# Patient Record
Sex: Female | Born: 1965
Health system: Southern US, Community
[De-identification: ages and names within clinical notes are randomized; demographics above are authoritative.]

## PROBLEM LIST (undated history)

## (undated) DIAGNOSIS — R011 Cardiac murmur, unspecified: Secondary | ICD-10-CM

## (undated) DIAGNOSIS — I8390 Asymptomatic varicose veins of unspecified lower extremity: Secondary | ICD-10-CM

## (undated) DIAGNOSIS — E785 Hyperlipidemia, unspecified: Secondary | ICD-10-CM

## (undated) DIAGNOSIS — T783XXA Angioneurotic edema, initial encounter: Secondary | ICD-10-CM

## (undated) DIAGNOSIS — J069 Acute upper respiratory infection, unspecified: Secondary | ICD-10-CM

## (undated) DIAGNOSIS — L509 Urticaria, unspecified: Secondary | ICD-10-CM

## (undated) DIAGNOSIS — J189 Pneumonia, unspecified organism: Secondary | ICD-10-CM

## (undated) DIAGNOSIS — R51 Headache: Secondary | ICD-10-CM

## (undated) DIAGNOSIS — K8020 Calculus of gallbladder without cholecystitis without obstruction: Secondary | ICD-10-CM

## (undated) HISTORY — DX: Hyperlipidemia, unspecified: E78.5

## (undated) HISTORY — DX: Cardiac murmur, unspecified: R01.1

## (undated) HISTORY — DX: Urticaria, unspecified: L50.9

## (undated) HISTORY — DX: Acute upper respiratory infection, unspecified: J06.9

## (undated) HISTORY — DX: Angioneurotic edema, initial encounter: T78.3XXA

## (undated) HISTORY — PX: KNEE SURGERY: SHX244

## (undated) HISTORY — PX: SHOULDER SURGERY: SHX246

## (undated) HISTORY — DX: Headache: R51

## (undated) HISTORY — DX: Asymptomatic varicose veins of unspecified lower extremity: I83.90

## (undated) HISTORY — DX: Pneumonia, unspecified organism: J18.9

## (undated) HISTORY — DX: Calculus of gallbladder without cholecystitis without obstruction: K80.20

---

## 1995-08-01 HISTORY — PX: WISDOM TOOTH EXTRACTION: SHX21

## 1998-10-07 ENCOUNTER — Other Ambulatory Visit: Admission: RE | Admit: 1998-10-07 | Discharge: 1998-10-07 | Payer: Self-pay | Admitting: Obstetrics & Gynecology

## 1999-02-28 ENCOUNTER — Observation Stay (HOSPITAL_COMMUNITY): Admission: RE | Admit: 1999-02-28 | Discharge: 1999-03-01 | Payer: Self-pay | Admitting: Surgery

## 1999-11-18 ENCOUNTER — Other Ambulatory Visit: Admission: RE | Admit: 1999-11-18 | Discharge: 1999-11-18 | Payer: Self-pay | Admitting: Obstetrics and Gynecology

## 2001-05-20 ENCOUNTER — Other Ambulatory Visit: Admission: RE | Admit: 2001-05-20 | Discharge: 2001-05-20 | Payer: Self-pay | Admitting: Internal Medicine

## 2002-08-01 ENCOUNTER — Other Ambulatory Visit: Admission: RE | Admit: 2002-08-01 | Discharge: 2002-08-01 | Payer: Self-pay | Admitting: Internal Medicine

## 2003-06-16 ENCOUNTER — Ambulatory Visit (HOSPITAL_BASED_OUTPATIENT_CLINIC_OR_DEPARTMENT_OTHER): Admission: RE | Admit: 2003-06-16 | Discharge: 2003-06-16 | Payer: Self-pay | Admitting: Orthopedic Surgery

## 2003-09-29 ENCOUNTER — Other Ambulatory Visit: Admission: RE | Admit: 2003-09-29 | Discharge: 2003-09-29 | Payer: Self-pay | Admitting: Internal Medicine

## 2005-01-13 ENCOUNTER — Ambulatory Visit: Payer: Self-pay | Admitting: Internal Medicine

## 2005-01-20 ENCOUNTER — Ambulatory Visit: Payer: Self-pay | Admitting: Internal Medicine

## 2005-01-20 ENCOUNTER — Other Ambulatory Visit: Admission: RE | Admit: 2005-01-20 | Discharge: 2005-01-20 | Payer: Self-pay | Admitting: Internal Medicine

## 2005-02-02 ENCOUNTER — Ambulatory Visit: Payer: Self-pay | Admitting: Internal Medicine

## 2005-07-31 HISTORY — PX: CHOLECYSTECTOMY: SHX55

## 2006-01-22 ENCOUNTER — Ambulatory Visit: Payer: Self-pay | Admitting: Internal Medicine

## 2006-02-05 ENCOUNTER — Ambulatory Visit: Payer: Self-pay | Admitting: Internal Medicine

## 2006-02-05 ENCOUNTER — Other Ambulatory Visit: Admission: RE | Admit: 2006-02-05 | Discharge: 2006-02-05 | Payer: Self-pay | Admitting: Internal Medicine

## 2006-07-02 ENCOUNTER — Ambulatory Visit: Payer: Self-pay | Admitting: Internal Medicine

## 2006-07-02 ENCOUNTER — Encounter: Admission: RE | Admit: 2006-07-02 | Discharge: 2006-07-02 | Payer: Self-pay | Admitting: Internal Medicine

## 2006-11-30 ENCOUNTER — Ambulatory Visit: Payer: Self-pay | Admitting: Internal Medicine

## 2006-11-30 LAB — CONVERTED CEMR LAB
Basophils Absolute: 0 10*3/uL (ref 0.0–0.1)
Basophils Relative: 0.2 % (ref 0.0–1.0)
MCHC: 34.4 g/dL (ref 30.0–36.0)
Monocytes Relative: 7.5 % (ref 3.0–11.0)
Neutrophils Relative %: 63.7 % (ref 43.0–77.0)
Platelets: 309 10*3/uL (ref 150–400)
RBC: 4.8 M/uL (ref 3.87–5.11)
WBC: 5.7 10*3/uL (ref 4.5–10.5)
hCG, Beta Chain, Quant, S: <0.5 milliintl units/mL

## 2007-03-22 ENCOUNTER — Encounter: Payer: Self-pay | Admitting: Internal Medicine

## 2007-05-02 DIAGNOSIS — R519 Headache, unspecified: Secondary | ICD-10-CM | POA: Insufficient documentation

## 2007-05-02 DIAGNOSIS — R51 Headache: Secondary | ICD-10-CM

## 2007-05-22 ENCOUNTER — Encounter: Payer: Self-pay | Admitting: Internal Medicine

## 2007-07-01 ENCOUNTER — Encounter: Payer: Self-pay | Admitting: Internal Medicine

## 2007-10-04 ENCOUNTER — Ambulatory Visit: Payer: Self-pay | Admitting: Internal Medicine

## 2007-10-04 DIAGNOSIS — S6390XA Sprain of unspecified part of unspecified wrist and hand, initial encounter: Secondary | ICD-10-CM | POA: Insufficient documentation

## 2007-10-04 DIAGNOSIS — N390 Urinary tract infection, site not specified: Secondary | ICD-10-CM

## 2007-10-04 DIAGNOSIS — R350 Frequency of micturition: Secondary | ICD-10-CM | POA: Insufficient documentation

## 2007-10-04 LAB — CONVERTED CEMR LAB
Nitrite: POSITIVE
Specific Gravity, Urine: 1.025
Urobilinogen, UA: 0.2
WBC Urine, dipstick: NEGATIVE

## 2007-10-05 ENCOUNTER — Encounter: Payer: Self-pay | Admitting: Internal Medicine

## 2007-10-28 ENCOUNTER — Other Ambulatory Visit: Admission: RE | Admit: 2007-10-28 | Discharge: 2007-10-28 | Payer: Self-pay | Admitting: Internal Medicine

## 2007-10-28 ENCOUNTER — Encounter: Payer: Self-pay | Admitting: Internal Medicine

## 2007-10-28 ENCOUNTER — Ambulatory Visit: Payer: Self-pay | Admitting: Internal Medicine

## 2007-11-20 ENCOUNTER — Ambulatory Visit: Payer: Self-pay | Admitting: Internal Medicine

## 2007-11-20 ENCOUNTER — Telehealth: Payer: Self-pay | Admitting: *Deleted

## 2007-11-20 LAB — CONVERTED CEMR LAB
Nitrite: NEGATIVE
Urobilinogen, UA: 1

## 2007-11-21 ENCOUNTER — Encounter: Payer: Self-pay | Admitting: Internal Medicine

## 2007-11-22 ENCOUNTER — Ambulatory Visit: Payer: Self-pay | Admitting: Internal Medicine

## 2007-11-25 LAB — CONVERTED CEMR LAB
Cholesterol: 195 mg/dL (ref 0–200)
HDL: 73.6 mg/dL (ref >39.0)
LDL Cholesterol: 114 mg/dL — ABNORMAL HIGH (ref 0–99)
Triglycerides: 39 mg/dL (ref 0–149)
VLDL: 8 mg/dL (ref 0–40)

## 2008-10-19 ENCOUNTER — Ambulatory Visit: Payer: Self-pay | Admitting: Internal Medicine

## 2010-02-04 ENCOUNTER — Ambulatory Visit: Payer: Self-pay | Admitting: Internal Medicine

## 2010-02-04 DIAGNOSIS — N63 Unspecified lump in unspecified breast: Secondary | ICD-10-CM | POA: Insufficient documentation

## 2010-02-07 ENCOUNTER — Telehealth: Payer: Self-pay | Admitting: Internal Medicine

## 2010-02-14 ENCOUNTER — Encounter: Payer: Self-pay | Admitting: Internal Medicine

## 2010-04-07 ENCOUNTER — Ambulatory Visit: Payer: Self-pay | Admitting: Internal Medicine

## 2010-04-07 LAB — CONVERTED CEMR LAB
ALT: 8 units/L (ref 0–35)
Albumin: 3.9 g/dL (ref 3.5–5.2)
BUN: 16 mg/dL (ref 6–23)
Basophils Absolute: 0 10*3/uL (ref 0.0–0.1)
Basophils Relative: 0.6 % (ref 0.0–3.0)
CO2: 28 meq/L (ref 19–32)
Calcium: 9.1 mg/dL (ref 8.4–10.5)
Chloride: 105 meq/L (ref 96–112)
Cholesterol: 181 mg/dL (ref 0–200)
Creatinine, Ser: 0.7 mg/dL (ref 0.4–1.2)
Eosinophils Relative: 0.8 % (ref 0.0–5.0)
GFR calc non Af Amer: 99.78 mL/min (ref >60)
Monocytes Relative: 8 % (ref 3.0–12.0)
Nitrite: NEGATIVE
Platelets: 268 10*3/uL (ref 150.0–400.0)
RBC: 4.12 M/uL (ref 3.87–5.11)
RDW: 13.2 % (ref 11.5–14.6)
Specific Gravity, Urine: 1.025
TSH: 1.42 microintl units/mL (ref 0.35–5.50)
Total CHOL/HDL Ratio: 3
Total Protein: 6.3 g/dL (ref 6.0–8.3)
Urobilinogen, UA: 0.2
WBC Urine, dipstick: NEGATIVE
WBC: 5.7 10*3/uL (ref 4.5–10.5)
pH: 7.5

## 2010-04-13 ENCOUNTER — Ambulatory Visit: Payer: Self-pay | Admitting: Internal Medicine

## 2010-04-13 ENCOUNTER — Other Ambulatory Visit: Admission: RE | Admit: 2010-04-13 | Discharge: 2010-04-13 | Payer: Self-pay | Admitting: Internal Medicine

## 2010-04-13 DIAGNOSIS — M19019 Primary osteoarthritis, unspecified shoulder: Secondary | ICD-10-CM | POA: Insufficient documentation

## 2010-04-13 LAB — HM PAP SMEAR

## 2010-07-29 ENCOUNTER — Encounter: Payer: Self-pay | Admitting: Internal Medicine

## 2010-08-28 LAB — CONVERTED CEMR LAB
Alkaline Phosphatase: 38 units/L — ABNORMAL LOW (ref 39–117)
BUN: 13 mg/dL (ref 6–23)
Basophils Relative: 0.5 % (ref 0.0–1.0)
Bilirubin Urine: NEGATIVE
Bilirubin, Direct: 0.1 mg/dL (ref 0.0–0.3)
Creatinine, Ser: 0.7 mg/dL (ref 0.4–1.2)
Eosinophils Absolute: 0 10*3/uL (ref 0.0–0.7)
Eosinophils Relative: 0.4 % (ref 0.0–5.0)
HCT: 39.9 % (ref 36.0–46.0)
Lymphocytes Relative: 21.4 % (ref 12.0–46.0)
MCV: 87.3 fL (ref 78.0–100.0)
Monocytes Relative: 7.3 % (ref 3.0–12.0)
Nitrite: NEGATIVE
Potassium: 3.6 meq/L (ref 3.5–5.1)
RBC: 4.57 M/uL (ref 3.87–5.11)
Sodium: 138 meq/L (ref 135–145)
Specific Gravity, Urine: >=1.03
TSH: 0.88 microintl units/mL (ref 0.35–5.50)
Total Bilirubin: 0.9 mg/dL (ref 0.3–1.2)
Urobilinogen, UA: 0.2
WBC Urine, dipstick: NEGATIVE
WBC: 7 10*3/uL (ref 4.5–10.5)
pH: 7

## 2010-09-01 NOTE — Progress Notes (Signed)
  Phone Note Call from Patient   Caller: Patient Call For: Madelin Headings MD Summary of Call: Pt is asking when her Mammogram was scheduled? [phone]  Initial call taken by: Lynann Beaver CMA,  February 07, 2010 1:32 PM  Follow-up for Phone Call        [facility] for pt twice today. Follow-up by: Corky Mull,  February 07, 2010 3:14 PM     Appended Document:  pt returned Terri's call.   [phone] until 4 pm

## 2010-09-01 NOTE — Assessment & Plan Note (Signed)
Summary: CPX/PAP/CJR   Vital Signs:  Patient profile:   45 year old female Menstrual status:  regular LMP:     04/09/2010 Height:      61.5 inches Weight:      108 pounds Pulse rate:   80 / minute BP sitting:   120 / 80  (right arm) Cuff size:   regular  Vitals Entered By: Romualdo Bolk, CMA (AAMA) (April 13, 2010 11:19 AM) CC: CPX-Discuss doing a pap-Pt is going to have surgery 10/7 for left shoulder.  LMP (date): 04/09/2010 LMP - Character: light Menarche (age onset years): 14   Menses interval (days): 28 Menstrual flow (days): 4 Enter LMP: 04/09/2010 Last PAP Result normal   History of Present Illness: Stephanie Hoffman comes in today  for preventive visit .  Since last visit : Breast got better      to go back  with mammo  in 6 months.   To have  LEft shoulder surgery  Dr Thurston Hole October 7th   bone spurs.  ho new problems    Preventive Care Screening  Mammogram:    Date:  01/28/2010    Results:  normal   Pap Smear:    Date:  10/28/2007    Results:  normal    Preventive Screening-Counseling & Management  Alcohol-Tobacco     Alcohol drinks/day: 0     Smoking Status: never     Passive Smoke Exposure: no  Caffeine-Diet-Exercise     Caffeine use/day: 0     Does Patient Exercise: yes     Type of exercise: dance     Exercise (avg: min/session): >60     Times/week: 1  Hep-HIV-STD-Contraception     Dental Visit-last 6 months yes     Sun Exposure-Excessive: no  Safety-Violence-Falls     Seat Belt Use: yes     Firearms in the Home: no firearms in the home     Smoke Detectors: yes      Blood Transfusions:  no.    Current Medications (verified): 1)  Meloxicam 15 Mg Tabs (Meloxicam) .Marland Kitchen.. 1 By Mouth Once Daily 2)  Tramadol Hcl 50 Mg Tabs (Tramadol Hcl) .Marland Kitchen.. 1 By Mouth Two Times A Day  Allergies (verified): 1)  ! Codeine  Past History:  Past medical, surgical, family and social histories (including risk factors) reviewed, and no changes noted  (except as noted below).  Past Medical History: Reviewed history from 05/02/2007 and no changes required. Varicose Veins Headache Heart Murmur  Past Surgical History: CB Caesarean section Cholecystectomy shoulder  right  left knee  surgery torn  ligamnet 2007   Past History:  Care Management: Orthopedics: Dr.Wainer  Family History: Reviewed history from 10/19/2008 and no changes required. Family History of Arthritis Family History Diabetes 1st degree relative Family History High cholesterol Family History Hypertension Family History of Prostate CA 1st degree relative <50 Family History of Stroke M 1st degree relative <50 Family History of Cardiovascular disorder  Social History: Reviewed history from 10/28/2007 and no changes required. Occupation: post office      Married Never Smoked Alcohol use-no Drug use-no Regular exercise-yes hh 4    pet  dog .     no ets Seat Belt Use:  yes Dental Care w/in 6 mos.:  yes Sun Exposure-Excessive:  no Blood Transfusions:  no Passive Smoke Exposure:  no  Review of Systems  The patient denies anorexia, fever, weight loss, weight gain, vision loss, decreased hearing, hoarseness, chest pain, syncope, dyspnea on  exertion, peripheral edema, prolonged cough, hemoptysis, abdominal pain, melena, hematochezia, severe indigestion/heartburn, hematuria, muscle weakness, difficulty walking, depression, unusual weight change, abnormal bleeding, enlarged lymph nodes, and angioedema.         joints      left shoulder to have surgery   can dance without sig problem   Physical Exam  General:  alert, well-developed, and well-nourished.   Head:  normocephalic and atraumatic.   Eyes:  PERRL, EOMs full, conjunctiva clear  glasses  Ears:  R ear normal, L ear normal, and no external deformities.   Nose:  no external deformity, no external erythema, and no nasal discharge.   Mouth:  good dentition and pharynx pink and moist.   Neck:  No  deformities, masses, or tenderness noted. Chest Wall:  No deformities, masses, or tenderness noted. Breasts:  No mass, nodules, thickening, tenderness, bulging, retraction, inflamation, nipple discharge or skin changes noted.   Lungs:  Normal respiratory effort, chest expands symmetrically. Lungs are clear to auscultation, no crackles or wheezes. Heart:  Normal rate and regular rhythm. S1 and S2 normal without gallop, murmur, click, rub or other extra sounds. Abdomen:  Bowel sounds positive,abdomen soft and non-tender without masses, organomegaly or hernias noted. Genitalia:  Pelvic Exam: some old blood in vault         External: normal female genitalia without lesions or masses        Vagina: normal without lesions or masses        Cervix: normal without lesions or masses        Adnexa: normal bimanual exam without masses or fullness        Uterus: normal by palpation        Pap smear: performed Msk:  no joint swelling and no joint warmth.  well healed scar left knee and right shoulder  Pulses:  pulses intact without delay   Extremities:  no clubbing cyanosis or edema  Neurologic:  alert & oriented X3 and gait normal.   Skin:  turgor normal, color normal, no suspicious lesions, and no ecchymoses.   Cervical Nodes:  No lymphadenopathy noted Axillary Nodes:  No palpable lymphadenopathy Inguinal Nodes:  No significant adenopathy Psych:  Normal eye contact, appropriate affect. Cognition appears normal.  labs reviewed and normal   Impression & Recommendations:  Problem # 1:  PREVENTIVE HEALTH CARE (ICD-V70.0) continue healthy lifestyle  .  injury prevention.  NO Ci to surgery.   Problem # 2:  ROUTINE GYNECOLOGICAL EXAMINATION (ICD-V72.31)  pap done nl exam   Orders: Pap Smear, Thin Prep ( Collection of) (Q4696)  Problem # 3:  BREAST MASS, LEFT (ICD-611.72) Assessment: Improved resolved after menses and to have follow up mammo in 6 months    Problem # 4:  ARTHRITIS, LEFT SHOULDER  (ICD-716.91)  Complete Medication List: 1)  Meloxicam 15 Mg Tabs (Meloxicam) .Marland Kitchen.. 1 by mouth once daily 2)  Tramadol Hcl 50 Mg Tabs (Tramadol hcl) .Marland Kitchen.. 1 by mouth two times a day  Other Orders: Admin 1st Vaccine (29528) Flu Vaccine 24yrs + (41324)  Patient Instructions: 1)  continue helathy lifestyle  2)  fax labs to surgeon office   3)  You will be informed of lab results when available.  Flu Vaccine Consent Questions     Do you have a history of severe allergic reactions to this vaccine? no    Any prior history of allergic reactions to egg and/or gelatin? no    Do you have a sensitivity to the preservative  Thimersol? no    Do you have a past history of Guillan-Barre Syndrome? no    Do you currently have an acute febrile illness? no    Have you ever had a severe reaction to latex? no    Vaccine information given and explained to patient? yes    Are you currently pregnant? no    Lot Number:[id number]   Exp Date:01/28/2011   Site Given  Left Deltoid IM  .lbflu Romualdo Bolk, CMA (AAMA)  April 13, 2010 11:24 AM

## 2010-09-01 NOTE — Assessment & Plan Note (Signed)
Summary: L BREAST PAIN / KNOT IS SAME AREA OF PAIN // RS   Vital Signs:  Patient profile:   45 year old female Menstrual status:  regular LMP:     01/22/2010 Weight:      110 pounds BMI:     20.19 Temp:     98.8 degrees F oral BP sitting:   100 / 58  (left arm) Cuff size:   regular  Vitals Entered By: Raechel Ache, RN (February 04, 2010 3:42 PM) CC: C/o pain and knot L breast since Wednesday. LMP (date): 01/22/2010 LMP - Character: light Menarche (age onset years): 14   Menses interval (days): 28 Menstrual flow (days): 4 Enter LMP: 01/22/2010   History of Present Illness: Stephanie Hoffman comes in today  for acute problem  to day   for above  with 2 days of above . doing a breast check   .    LMP is June 25 this feels different than before.     checks her breast regularly and this is new.    Next menses due  in another 2-3 weeks .   No dischareg hx of bx etc.  last mammo  a few years ago.   Preventive Screening-Counseling & Management  Alcohol-Tobacco     Alcohol drinks/day: 0     Smoking Status: never  Allergies: 1)  ! Codeine  Past History:  Past medical, surgical, family and social histories (including risk factors) reviewed for relevance to current acute and chronic problems.  Past Medical History: Reviewed history from 05/02/2007 and no changes required. Varicose Veins Headache Heart Murmur  Past Surgical History: Reviewed history from 10/28/2007 and no changes required. CB Caesarean section Cholecystectomy shoulder   Family History: Reviewed history from 10/19/2008 and no changes required. Family History of Arthritis Family History Diabetes 1st degree relative Family History High cholesterol Family History Hypertension Family History of Prostate CA 1st degree relative <50 Family History of Stroke M 1st degree relative <50 Family History of Cardiovascular disorder  Social History: Reviewed history from 10/28/2007 and no changes  required. Occupation: Married Never Smoked Alcohol use-no Drug use-no Regular exercise-yes  hh 4  no pets    no ets  Review of Systems  The patient denies anorexia, fever, weight loss, weight gain, vision loss, chest pain, abdominal pain, enlarged lymph nodes, and angioedema.    Physical Exam  General:  Well-developed,well-nourished,in no acute distress; alert,appropriate and cooperative throughout examination Head:  normocephalic and atraumatic.   Breasts:  left breast with  almond sixed  mobile  nodule mildy tender   smooth   at aout 6 o  clock   righ breast no discrete nodule  axilla clear   no discharge  Lungs:  normal respiratory effort and no intercostal retractions.   Heart:  normal rate and regular rhythm.   Abdomen:  Bowel sounds positive,abdomen soft and non-tender without masses, organomegaly or  noted. Axillary Nodes:  no R axillary adenopathy and no L axillary adenopathy.   Psych:  Oriented X3, normally interactive, good eye contact, not anxious appearing, and not depressed appearing.     Impression & Recommendations:  Problem # 1:  BREAST MASS, LEFT (ICD-611.72) new onset  poss a cyst  ..pt concerned and doesnt want to wait  until after her menses  because of age     rec  go ahead with diagnostic mammo and [place].   will refer to [facility] where last mammo was a few years ago.  Orders: Radiology Referral (Radiology)  Patient Instructions: 1)  will do referral  for [place] and mammo at [facility].

## 2011-04-11 ENCOUNTER — Other Ambulatory Visit (INDEPENDENT_AMBULATORY_CARE_PROVIDER_SITE_OTHER): Payer: Federal, State, Local not specified - PPO

## 2011-04-11 DIAGNOSIS — Z0000 Encounter for general adult medical examination without abnormal findings: Secondary | ICD-10-CM

## 2011-04-11 LAB — POCT URINALYSIS DIPSTICK
Bilirubin, UA: NEGATIVE
Glucose, UA: NEGATIVE
Leukocytes, UA: NEGATIVE
Protein, UA: NEGATIVE
Spec Grav, UA: 1.02
Urobilinogen, UA: 0.2
pH, UA: 7

## 2011-04-11 LAB — CBC WITH DIFFERENTIAL/PLATELET
Basophils Absolute: 0 10*3/uL (ref 0.0–0.1)
Eosinophils Absolute: 0.1 10*3/uL (ref 0.0–0.7)
HCT: 37.4 % (ref 36.0–46.0)
MCHC: 33.7 g/dL (ref 30.0–36.0)
MCV: 88.4 fl (ref 78.0–100.0)
Monocytes Relative: 6.7 % (ref 3.0–12.0)

## 2011-04-11 LAB — TSH: TSH: 1.12 u[IU]/mL (ref 0.35–5.50)

## 2011-04-13 LAB — LIPID PANEL
Triglycerides: 47 mg/dL (ref 0.0–149.0)
VLDL: 9.4 mg/dL (ref 0.0–40.0)

## 2011-04-13 LAB — HEPATIC FUNCTION PANEL
ALT: 10 U/L (ref 0–35)
AST: 17 U/L (ref 0–37)
Alkaline Phosphatase: 42 U/L (ref 39–117)

## 2011-04-13 LAB — BASIC METABOLIC PANEL
BUN: 17 mg/dL (ref 6–23)
CO2: 25 mEq/L (ref 19–32)
Calcium: 9 mg/dL (ref 8.4–10.5)
Chloride: 107 mEq/L (ref 96–112)
Creatinine, Ser: 0.8 mg/dL (ref 0.4–1.2)
GFR: 83.54 mL/min (ref >60.00)
Glucose, Bld: 64 mg/dL — ABNORMAL LOW (ref 70–99)
Potassium: 4.5 mEq/L (ref 3.5–5.1)

## 2011-04-18 ENCOUNTER — Encounter: Payer: Self-pay | Admitting: Internal Medicine

## 2011-04-18 ENCOUNTER — Ambulatory Visit (INDEPENDENT_AMBULATORY_CARE_PROVIDER_SITE_OTHER): Payer: Federal, State, Local not specified - PPO | Admitting: Internal Medicine

## 2011-04-18 DIAGNOSIS — Z800 Family history of malignant neoplasm of digestive organs: Secondary | ICD-10-CM

## 2011-04-18 DIAGNOSIS — Z0000 Encounter for general adult medical examination without abnormal findings: Secondary | ICD-10-CM

## 2011-06-02 NOTE — Progress Notes (Signed)
System Downtime Recovery The EMR experienced a system downtime.  This downtime occurred on 04-18-2011. During this downtime paper charting was completed by the provider.  The visit was documented on paper during the downtime and will be scanned into [facility], billing was completed by the [facility] Billing Department .  The visit is being closed on behalf of the provider. 

## 2011-07-05 ENCOUNTER — Telehealth: Payer: Self-pay | Admitting: Internal Medicine

## 2011-07-05 NOTE — Telephone Encounter (Signed)
Pt is experiencing Sinus infection/ cough x 2 week and would like to be seen by Dr. Fabian Sharp. Can pt be worked in today?

## 2011-07-05 NOTE — Telephone Encounter (Signed)
Left a message for pt to call back to get more information on symptoms.

## 2011-07-05 NOTE — Telephone Encounter (Signed)
Per Dr. Fabian Sharp- come in between 9:45 and 10. Pt aware and aware that she will be a work in appt.

## 2011-07-05 NOTE — Telephone Encounter (Signed)
Pt states she had a sinus infection on Thanksgiving and it never got better. Pt has congestion, cough and some pain in her chest area from coughing. Pt denies fever, body aches, and SOB. Pt would like to worked in. Pls advise.

## 2011-07-06 ENCOUNTER — Ambulatory Visit (INDEPENDENT_AMBULATORY_CARE_PROVIDER_SITE_OTHER): Payer: Federal, State, Local not specified - PPO | Admitting: Internal Medicine

## 2011-07-06 ENCOUNTER — Encounter: Payer: Self-pay | Admitting: Internal Medicine

## 2011-07-06 VITALS — BP 110/60 | HR 74 | Temp 98.6°F | Wt 116.0 lb

## 2011-07-06 DIAGNOSIS — J069 Acute upper respiratory infection, unspecified: Secondary | ICD-10-CM

## 2011-07-06 DIAGNOSIS — J329 Chronic sinusitis, unspecified: Secondary | ICD-10-CM

## 2011-07-06 DIAGNOSIS — R059 Cough, unspecified: Secondary | ICD-10-CM | POA: Insufficient documentation

## 2011-07-06 DIAGNOSIS — R05 Cough: Secondary | ICD-10-CM

## 2011-07-06 MED ORDER — AZITHROMYCIN 250 MG PO TABS: 250.0000 mg | ORAL_TABLET | ORAL | Status: AC

## 2011-07-06 MED ORDER — HYDROCODONE-HOMATROPINE 5-1.5 MG/5ML PO SYRP: 5.0000 mL | ORAL_SOLUTION | ORAL | Status: DC | PRN

## 2011-07-06 NOTE — Progress Notes (Signed)
  Subjective:    Patient ID: Stephanie Hoffman, female    DOB: 1966/04/11, 45 y.o.   MRN: [id number]  HPI Patient comes in today for SDA  For acute problem evaluation. Work in appt   Onset  Over 2 weeks ago with  ur congestion sinus infection  fever and malaise HA . Used otc sudafed and now alkaseltzer cold now nose getting better but coughing exp at night. Getting worse  Dry cough   andt chest hurts. No sob wheeze   Family has recently got an ur infection less than a week. Did get flu shot this year.  No chills fever recently   Review of Systems ROS:  GEN/ HEENTNo fever, significant weight changes sweats headaches vision problems hearing changes, CV/ PULM; No shortness of breath  syncope,edema  change in exercise tolerance. GI /GU: No adominal pain, vomiting, change in bowel habits. No blood in the stool. No significant GU symptoms SKIN/HEME: ,no acute skin rashes suspicious lesions or bleeding. No lymphadenopathy, nodules, masses.  NEURO/ PSYCH:  No neurologic signs such as weakness numbness IMM/ Allergy: No unusual infections.  Allergy .   REST of 12 system review negative Past history family history social history reviewed in the electronic medical record.     Objective:   Physical Exam WDWN in NAD  quiet respirations; mildly congested  somewhat hoarse. Non toxic . droswsy ( after meds) HEENT: Normocephalic ;atraumatic , Eyes;  PERRL, EOMs  Full, lids and conjunctiva clear,,Ears: no deformities, canals nl, TM landmarks normal, Nose: no deformity or discharge but congested;face non tender Mouth : OP clear without lesion or edema . Neck: Supple without adenopathy or masses or bruits Chest:  Clear to A&P without wheezes rales or rhonchi  Coughs   CV:  S1-S2 no gallops or murmurs peripheral perfusion is normal Skin :nl perfusion and no acute rashes      Assessment & Plan:  Prolonged  rti poss atypicals vs residual  Sinusitis  No other alarm findings  Disc options   Can take cough   Med just gets itching . With codiene.  Can add antibiotic if getting wores over the  Next few days  Contact us with alarm features or progression

## 2011-07-06 NOTE — Patient Instructions (Signed)
  Cough med as tolerated for comfort I dont hear pneumonia today. Most resp infection significantly improve in 2 weeks  Possible sinusitis or atypical germs that may respond to an antibiotic. If not getting better in next few days can add antibiotic  Call or seek care  if  persistent or progressive or alarm symptoms as discussed. Fever shortness of breath.

## 2011-07-15 ENCOUNTER — Other Ambulatory Visit: Payer: Self-pay | Admitting: Internal Medicine

## 2011-07-17 ENCOUNTER — Telehealth: Payer: Self-pay | Admitting: *Deleted

## 2011-07-17 NOTE — Telephone Encounter (Signed)
Pt is still having the cough. Still having some congestion. She would like a refill on the cough syrup. No fever. Using her inhaler some.

## 2011-07-18 MED ORDER — HYDROCODONE-HOMATROPINE 5-1.5 MG/5ML PO SYRP: 5.0000 mL | ORAL_SOLUTION | ORAL | Status: DC | PRN

## 2011-07-18 NOTE — Telephone Encounter (Signed)
Rx called in and left message for pt about this

## 2011-07-18 NOTE — Telephone Encounter (Signed)
Call in Hydromet 5 ml q 4 hours prn cough, 240 ml with no rf  

## 2012-02-19 ENCOUNTER — Encounter: Payer: Self-pay | Admitting: Internal Medicine

## 2012-02-19 ENCOUNTER — Ambulatory Visit (INDEPENDENT_AMBULATORY_CARE_PROVIDER_SITE_OTHER): Payer: 59 | Admitting: Internal Medicine

## 2012-02-19 VITALS — BP 100/60 | HR 96 | Temp 99.1°F | Wt 111.0 lb

## 2012-02-19 DIAGNOSIS — R51 Headache: Secondary | ICD-10-CM

## 2012-02-19 DIAGNOSIS — R42 Dizziness and giddiness: Secondary | ICD-10-CM | POA: Insufficient documentation

## 2012-02-19 MED ORDER — INDOMETHACIN 25 MG PO CAPS: 25.0000 mg | ORAL_CAPSULE | Freq: Two times a day (BID) | ORAL | Status: AC

## 2012-02-19 NOTE — Patient Instructions (Addendum)
It is uncertain why you're having the recurrent head pain. He assuring that your exam is good. However because this is progressing and getting more severe I will arrange a neurology consult.  In the meantime try the indomethacin and to see if this is helpful for your head pain.     Contact us in the meantime things or progressive.  The dizzy feeling seem  Like a positional vertigo and sometime can improve with physical therapy. However  Would like the head pain to be evaluated and better before  Adding this to the mix.

## 2012-02-19 NOTE — Progress Notes (Signed)
  Subjective:    Patient ID: Stephanie Hoffman, female    DOB: 06/20/1966, 46 y.o.   MRN: [id number]  HPI Patient comes in today for SDA for  worsening problem evaluation. Patient has had this problem for a year but becoming more frequent and severe. Last few weks happens every day and last up to 30 minutes at times  And no associated sx.  ocass takes tylenol unsure if helps. She describes them as  frequent sharp shooting pains always in the right area of her occipital scalp behind her right ear. Doesn't radiate to her neck or down her arm at this time. No vision changes hearing pages she does have to stop what she does until it goes away. No vomiting not particularly nocturnal although she wakes up in the morning she notices it'll be there.  Took an Customer service manager bodyache medicine and it stopped after 30 minutes. Also un related to this she has had intermittent dizziness that she describes as the room spinning she did stop dancing because of this  no history of falls /concussions/ vision changes. Review of Systems Neg fever cp sob bleeding swelling  Neck pain or spasm  novomiting or balance issues otherwise tTimes her shoulder acts up she has had surgery on both shoulders different years [facility]. Outpatient Encounter Prescriptions as of 02/19/2012  Medication Sig Dispense Refill  . DISCONTD: HYDROcodone-homatropine (HYCODAN) 5-1.5 MG/5ML syrup Take 5 mLs by mouth every 4 (four) hours as needed for cough.  240 mL  0   Past history family history social history reviewed in the electronic medical record. Works [employer]    Objective:   Physical Exam BP 100/60  Pulse 96  Temp 99.1 F (37.3 C) (Oral)  Wt 111 lb (50.349 kg)  SpO2 98%  LMP 02/11/2012  WDWN in nad   HEENT: Normocephalic ;atraumatic , Eyes;  PERRL, EOMs  Full, lids and conjunctiva clear,,Ears: no deformities, canals nl, TM landmarks normal, Nose: no deformity or discharge  Mouth : OP clear without lesion or edema . Points to right  occipital area of concern no nodules Neck: Supple without adenopathy or masses or bruits Chest:  Clear to A&P without wheezes rales or rhonchi CV:  S1-S2 no gallops or murmurs peripheral perfusion is normal NEURO: oriented x 3 CN 3-12 appear intact. No focal muscle weakness or atrophy. DTRs symmetrical. Gait WNL.  Grossly non focal. No tremor or abnormal movement. Neg rhomberg nl gait in 2 inch wedges.  Skin: normal capillary refill ,turgor , color: No acute rashes ,petechiae or bruising.     Assessment & Plan:    Head pain spells q d increasing frequency  Uncertain cause   Seems to high for cervicogenic radiation. Fortunately exam ok today. Trial indocin 25 - 50 bid for a week or so and then prn.  Refer for neuro consult. Other evaluation if imagine advised. Etc  Dizziness  By hx seems not related in time but like mild positional vertigo.  Would delineate the head pain before considering pt  For this .

## 2012-05-01 ENCOUNTER — Other Ambulatory Visit: Payer: Federal, State, Local not specified - PPO

## 2012-05-08 ENCOUNTER — Encounter: Payer: Federal, State, Local not specified - PPO | Admitting: Internal Medicine

## 2012-07-03 ENCOUNTER — Other Ambulatory Visit (INDEPENDENT_AMBULATORY_CARE_PROVIDER_SITE_OTHER): Payer: 59

## 2012-07-03 DIAGNOSIS — Z0000 Encounter for general adult medical examination without abnormal findings: Secondary | ICD-10-CM

## 2012-07-03 LAB — HEPATIC FUNCTION PANEL
Bilirubin, Direct: 0.1 mg/dL (ref 0.0–0.3)
Total Protein: 7 g/dL (ref 6.0–8.3)

## 2012-07-03 LAB — LIPID PANEL
Cholesterol: 190 mg/dL (ref 0–200)
LDL Cholesterol: 112 mg/dL — ABNORMAL HIGH (ref 0–99)
Total CHOL/HDL Ratio: 3
VLDL: 8.4 mg/dL (ref 0.0–40.0)

## 2012-07-03 LAB — BASIC METABOLIC PANEL
BUN: 17 mg/dL (ref 6–23)
CO2: 24 mEq/L (ref 19–32)
Chloride: 106 mEq/L (ref 96–112)
Creatinine, Ser: 0.7 mg/dL (ref 0.4–1.2)
GFR: 100.49 mL/min (ref >60.00)
Glucose, Bld: 84 mg/dL (ref 70–99)
Potassium: 4 mEq/L (ref 3.5–5.1)

## 2012-07-03 LAB — CBC WITH DIFFERENTIAL/PLATELET
Hemoglobin: 13.1 g/dL (ref 12.0–15.0)
MCV: 87.3 fl (ref 78.0–100.0)
Neutro Abs: 3.5 10*3/uL (ref 1.4–7.7)
RBC: 4.49 Mil/uL (ref 3.87–5.11)

## 2012-07-03 LAB — POCT URINALYSIS DIPSTICK
Glucose, UA: NEGATIVE
Leukocytes, UA: NEGATIVE
Protein, UA: NEGATIVE
Urobilinogen, UA: 0.2

## 2012-07-10 ENCOUNTER — Encounter: Payer: Self-pay | Admitting: Internal Medicine

## 2012-07-10 ENCOUNTER — Ambulatory Visit (INDEPENDENT_AMBULATORY_CARE_PROVIDER_SITE_OTHER): Payer: 59 | Admitting: Internal Medicine

## 2012-07-10 VITALS — BP 116/72 | HR 75 | Temp 98.7°F | Ht 61.5 in | Wt 117.0 lb

## 2012-07-10 DIAGNOSIS — R1031 Right lower quadrant pain: Secondary | ICD-10-CM

## 2012-07-10 DIAGNOSIS — M542 Cervicalgia: Secondary | ICD-10-CM

## 2012-07-10 DIAGNOSIS — Z800 Family history of malignant neoplasm of digestive organs: Secondary | ICD-10-CM

## 2012-07-10 DIAGNOSIS — R229 Localized swelling, mass and lump, unspecified: Secondary | ICD-10-CM

## 2012-07-10 DIAGNOSIS — Z23 Encounter for immunization: Secondary | ICD-10-CM

## 2012-07-10 DIAGNOSIS — M79609 Pain in unspecified limb: Secondary | ICD-10-CM

## 2012-07-10 DIAGNOSIS — Z0000 Encounter for general adult medical examination without abnormal findings: Secondary | ICD-10-CM

## 2012-07-10 DIAGNOSIS — M79601 Pain in right arm: Secondary | ICD-10-CM

## 2012-07-10 NOTE — Progress Notes (Signed)
Chief Complaint  Patient presents with  . Annual Exam    HPI: Patient comes in today for Preventive Health Care visit  Since last visit doing ok except her shoulders and rUE still has sx uncertain if from shoulder or neck  Has right arm pain  And nodule on forearm still sometimes tenderness does keyboarding all day . Was told had fluid in righ tear  Has good work post ion. Saw [facility] clinic and didn't feel optimal experience ; had injection right neck  Has some neck tenderness and nottiness still no weakness in arms   NO cp sob  Has rlq ? abd muscle pull pain with cough sneeze or sit up use. NO bulging nvd change. NO gi sx  Blood in stool MOm had colon cancer in mid early 60s . ROS:  GEN/ HEENT: No fever, significant weight changes sweats headaches vision problems hearing changes, CV/ PULM; No chest pain shortness of breath cough, syncope,edema  change in exercise tolerance. GI /GU: No adominal pain, vomiting, change in bowel habits. No blood in the stool. No significant GU symptoms. SKIN/HEME: ,no acute skin rashes suspicious lesions or bleeding. No lymphadenopathy, nodules, masses.  NEURO/ PSYCH:  No neurologic signs such as weakness numbness. No depression anxiety. IMM/ Allergy: No unusual infections.  Allergy .   REST of 12 system review negative except as per HPI   Past Medical History  Diagnosis Date  . Varicose veins   . Headache   . Heart murmur     Family History  Problem Relation Age of Onset  . Arthritis    . Diabetes    . Hyperlipidemia    . Hypertension    . Prostate cancer    . Stroke    . Heart disease    . Colon cancer Mother     History   Social History  . Marital Status: Married    Spouse Name: N/A    Number of Children: N/A  . Years of Education: N/A   Social History Main Topics  . Smoking status: Never Smoker   . Smokeless tobacco: None  . Alcohol Use: No  . Drug Use: No  . Sexually Active: None   Other Topics Concern  . 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    EXAM:  BP 116/72  Pulse 75  Temp 98.7 F (37.1 C) (Oral)  Ht 5' 1.5" (1.562 m)  Wt 117 lb (53.071 kg)  BMI 21.75 kg/m2  SpO2 97%  LMP 06/21/2012  Body mass index is 21.75 kg/(m^2).  Physical Exam: Vital signs reviewed WUJ:WJXB is a well-developed well-nourished alert cooperative   female who appears her stated age in no acute distress.  HEENT: normocephalic atraumatic , Eyes: PERRL EOM's full, conjunctiva clear, glasses Nares: paten,t no deformity discharge or tenderness., Ears: no deformity EAC's clear TMs with normal landmarks. Mouth: clear OP, no lesions, edema.  Moist mucous membranes. Dentition in adequate repair. NECK: supple without masses, thyromegaly or bruits. Points to right sub occ area of tenderness  Some spasm no masses CHEST/PULM:  Clear to auscultation and percussion breath sounds equal no wheeze , rales or rhonchi. No chest wall deformities or tenderness. Breast: normal by inspection . No dimpling, discharge, masses, tenderness or discharge . CV: PMI is nondisplaced, S1 S2 no gallops, murmurs, rubs. Peripheral pulses are full without delay.No JVD .  ABDOMEN: Bowel sounds normal nontender  No guard or rebound, no hepato splenomegal no CVA tenderness.  No hernia. Low  Transverse scar intact and area of tenderness ato right lateral superior edge  ? Poss fullness when standing  Extremtities:  No clubbing cyanosis or edema, no acute joint swelling or redness no focal atrophy NEURO:  Oriented x3, cranial nerves 3-12 appear to be intact, no obvious focal weakness,gait within normal limits no abnormal reflexes or asymmetrical SKIN: No acute rashes normal turgor, color, no bruising or petechiae. PSYCH: Oriented, good eye contact, no obvious depression anxiety, cognition and judgment appear normal. LN: no cervical axillary  inguinal adenopathy  Lab Results  Component Value Date   WBC 4.9 07/03/2012   HGB 13.1 07/03/2012   HCT 39.2 07/03/2012   PLT 280.0 07/03/2012   GLUCOSE 84 07/03/2012   CHOL 190 07/03/2012   TRIG 42.0 07/03/2012   HDL 69.60 07/03/2012   LDLCALC 112* 07/03/2012   ALT 9 07/03/2012   AST 15 07/03/2012   NA 141 07/03/2012   K 4.0 07/03/2012   CL 106 07/03/2012   CREATININE 0.7 07/03/2012   BUN 17 07/03/2012   CO2 24 07/03/2012   TSH 0.90 07/03/2012    ASSESSMENT AND PLAN:  Discussed the following assessment and plan:  1. Visit for preventive health examination   2. Need for prophylactic vaccination and inoculation against influenza   3. Neck pain on right side    saw HA clinic seems ms  4. Nodule, subcutaneous right arm     prob gangion on right arm   5. Right arm pain    ucnertain if from neck or shoulder +overuse ? see ortho and consider hand evaluation   6. Abdominal wall pain in right lower quadrant    strain? consdider hernia near old incision if continues  7. Family hx of colon cancer    mom   Counseled regarding healthy nutrition, exercise, sleep, injury prevention, calcium vit d and healthy weight . stool cards today  Right lower abd pain ab wall  At end of incision  .   ? If could have small hernia.   Follow and if needed consider surgery to see Patient Care Team: Madelin Headings, MD as PCP - General Nilda Simmer, MD (Orthopedic Surgery) Patient Instructions  Follow up with your orthopedist about her right upper extremity difficulty I agree you could have something related to the neck shoulder .   Also have them check the nodule in your right forearm.  I think you have an abdominal wall muscle pull wait another 3 or 4 weeks if getting worse instead of better or bulging at the site contact us and we can do a referral to surgery to check for hernia.  You should have a colonoscopy 10 years before your mom developed her cancer. If you have any blood in your stool he should be  evaluated earlier you can do the stool cards screening in the meantime if you wish.  Your laboratory studies are great.  Preventive Care for Adults, Female A healthy lifestyle and preventive care can promote health and wellness. Preventive health guidelines for women include the following key practices.  A routine yearly physical is a good way to check with your caregiver about your health and preventive screening. It is a chance to share any concerns and updates on your health, and to receive a thorough exam.  Visit your dentist for a routine exam and preventive care every 6 months.  Brush your teeth twice a day and floss once a day. Good oral hygiene prevents tooth decay and gum disease.  The frequency of eye exams is based on your age, health, family medical history, use of contact lenses, and other factors. Follow your caregiver's recommendations for frequency of eye exams.  Eat a healthy diet. Foods like vegetables, fruits, whole grains, low-fat dairy products, and lean protein foods contain the nutrients you need without too many calories. Decrease your intake of foods high in solid fats, added sugars, and salt. Eat the right amount of calories for you.Get information about a proper diet from your caregiver, if necessary.  Regular physical exercise is one of the most important things you can do for your health. Most adults should get at least 150 minutes of moderate-intensity exercise (any activity that increases your heart rate and causes you to sweat) each week. In addition, most adults need muscle-strengthening exercises on 2 or more days a week.  Maintain a healthy weight. The body mass index (BMI) is a screening tool to identify possible weight problems. It provides an estimate of body fat based on height and weight. Your caregiver can help determine your BMI, and can help you achieve or maintain a healthy weight.For adults 20 years and older:  A BMI below 18.5 is considered  underweight.  A BMI of 18.5 to 24.9 is normal.  A BMI of 25 to 29.9 is considered overweight.  A BMI of 30 and above is considered obese.  Maintain normal blood lipids and cholesterol levels by exercising and minimizing your intake of saturated fat. Eat a balanced diet with plenty of fruit and vegetables. Blood tests for lipids and cholesterol should begin at age 18 and be repeated every 5 years. If your lipid or cholesterol levels are high, you are over 50, or you are at high risk for heart disease, you may need your cholesterol levels checked more frequently.Ongoing high lipid and cholesterol levels should be treated with medicines if diet and exercise are not effective.  If you smoke, find out from your caregiver how to quit. If you do not use tobacco, do not start.  If you are pregnant, do not drink alcohol. If you are breastfeeding, be very cautious about drinking alcohol. If you are not pregnant and choose to drink alcohol, do not exceed 1 drink per day. One drink is considered to be 12 ounces (355 mL) of beer, 5 ounces (148 mL) of wine, or 1.5 ounces (44 mL) of liquor.  Avoid use of street drugs. Do not share needles with anyone. Ask for help if you need support or instructions about stopping the use of drugs.  High blood pressure causes heart disease and increases the risk of stroke. Your blood pressure should be checked at least every 1 to 2 years. Ongoing high blood pressure should be treated with medicines if weight loss and exercise are not effective.  If you are 47 to 46 years old, ask your caregiver if you should take aspirin to prevent strokes.  Diabetes screening involves taking a blood sample to check your fasting blood sugar level. This should be done once every 3 years, after age 48, if you are within normal weight and without risk factors for diabetes. Testing should be considered at a younger age or be carried out more frequently if you are overweight and have at least 1  risk factor for diabetes.  Breast cancer screening is essential preventive care for women. You should practice "breast self-awareness."  This means understanding the normal appearance and feel of your breasts and may include breast self-examination. Any changes detected, no matter how small, should be reported to a caregiver. Women in their 87s and 30s should have a clinical breast exam (CBE) by a caregiver as part of a regular health exam every 1 to 3 years. After age 42, women should have a CBE every year. Starting at age 80, women should consider having a mammography (breast X-ray test) every year. Women who have a family history of breast cancer should talk to their caregiver about genetic screening. Women at a high risk of breast cancer should talk to their caregivers about having magnetic resonance imaging (MRI) and a mammography every year.  The Pap test is a screening test for cervical cancer. A Pap test can show cell changes on the cervix that might become cervical cancer if left untreated. A Pap test is a procedure in which cells are obtained and examined from the lower end of the uterus (cervix).  Women should have a Pap test starting at age 42.  Between ages 1 and 28, Pap tests should be repeated every 2 years.  Beginning at age 23, you should have a Pap test every 3 years as long as the past 3 Pap tests have been normal.  Some women have medical problems that increase the chance of getting cervical cancer. Talk to your caregiver about these problems. It is especially important to talk to your caregiver if a new problem develops soon after your last Pap test. In these cases, your caregiver may recommend more frequent screening and Pap tests.  The above recommendations are the same for women who have or have not gotten the vaccine for human papillomavirus (HPV).  If you had a hysterectomy for a problem that was not cancer or a condition that could lead to cancer, then you no longer need  Pap tests. Even if you no longer need a Pap test, a regular exam is a good idea to make sure no other problems are starting.  If you are between ages 28 and 70, and you have had normal Pap tests going back 10 years, you no longer need Pap tests. Even if you no longer need a Pap test, a regular exam is a good idea to make sure no other problems are starting.  If you have had past treatment for cervical cancer or a condition that could lead to cancer, you need Pap tests and screening for cancer for at least 20 years after your treatment.  If Pap tests have been discontinued, risk factors (such as a new sexual partner) need to be reassessed to determine if screening should be resumed.  The HPV test is an additional test that may be used for cervical cancer screening. The HPV test looks for the virus that can cause the cell changes on the cervix. The cells collected during the Pap test can be tested for HPV. The HPV test could be used to screen women aged 48 years and older, and should be used in women of any age who have unclear Pap test results. After the age of 17, women should have HPV testing at the same frequency as a Pap test.  Colorectal cancer can be detected and often prevented. Most routine colorectal cancer screening begins at the age of 60 and continues through age 43. However, your caregiver may recommend screening at an earlier age if you have risk factors for colon cancer. On a yearly basis, your  caregiver may provide home test kits to check for hidden blood in the stool. Use of a small camera at the end of a tube, to directly examine the colon (sigmoidoscopy or colonoscopy), can detect the earliest forms of colorectal cancer. Talk to your caregiver about this at age 66, when routine screening begins. Direct examination of the colon should be repeated every 5 to 10 years through age 26, unless early forms of pre-cancerous polyps or small growths are found.  Hepatitis C blood testing is  recommended for all people born from 1 through 1965 and any individual with known risks for hepatitis C.  Practice safe sex. Use condoms and avoid high-risk sexual practices to reduce the spread of sexually transmitted infections (STIs). STIs include gonorrhea, chlamydia, syphilis, trichomonas, herpes, HPV, and human immunodeficiency virus (HIV). Herpes, HIV, and HPV are viral illnesses that have no cure. They can result in disability, cancer, and death. Sexually active women aged 27 and younger should be checked for chlamydia. Older women with new or multiple partners should also be tested for chlamydia. Testing for other STIs is recommended if you are sexually active and at increased risk.  Osteoporosis is a disease in which the bones lose minerals and strength with aging. This can result in serious bone fractures. The risk of osteoporosis can be identified using a bone density scan. Women ages 79 and over and women at risk for fractures or osteoporosis should discuss screening with their caregivers. Ask your caregiver whether you should take a calcium supplement or vitamin D to reduce the rate of osteoporosis.  Menopause can be associated with physical symptoms and risks. Hormone replacement therapy is available to decrease symptoms and risks. You should talk to your caregiver about whether hormone replacement therapy is right for you.  Use sunscreen with sun protection factor (SPF) of 30 or more. Apply sunscreen liberally and repeatedly throughout the day. You should seek shade when your shadow is shorter than you. Protect yourself by wearing long sleeves, pants, a wide-brimmed hat, and sunglasses year round, whenever you are outdoors.  Once a month, do a whole body skin exam, using a mirror to look at the skin on your back. Notify your caregiver of new moles, moles that have irregular borders, moles that are larger than a pencil eraser, or moles that have changed in shape or color.  Stay current  with required immunizations.  Influenza. You need a dose every fall (or winter). The composition of the flu vaccine changes each year, so being vaccinated once is not enough.  Pneumococcal polysaccharide. You need 1 to 2 doses if you smoke cigarettes or if you have certain chronic medical conditions. You need 1 dose at age 102 (or older) if you have never been vaccinated.  Tetanus, diphtheria, pertussis (Tdap, Td). Get 1 dose of Tdap vaccine if you are younger than age 46, are over 88 and have contact with an infant, are a Research scientist (physical sciences), are pregnant, or simply want to be protected from whooping cough. After that, you need a Td booster dose every 10 years. Consult your caregiver if you have not had at least 3 tetanus and diphtheria-containing shots sometime in your life or have a deep or dirty wound.  HPV. You need this vaccine if you are a woman age 72 or younger. The vaccine is given in 3 doses over 6 months.  Measles, mumps, rubella (MMR). You need at least 1 dose of MMR if you were born in 1957 or later. You may  also need a second dose.  Meningococcal. If you are age 51 to 79 and a first-year college student living in a residence hall, or have one of several medical conditions, you need to get vaccinated against meningococcal disease. You may also need additional booster doses.  Zoster (shingles). If you are age 25 or older, you should get this vaccine.  Varicella (chickenpox). If you have never had chickenpox or you were vaccinated but received only 1 dose, talk to your caregiver to find out if you need this vaccine.  Hepatitis A. You need this vaccine if you have a specific risk factor for hepatitis A virus infection or you simply wish to be protected from this disease. The vaccine is usually given as 2 doses, 6 to 18 months apart.  Hepatitis B. You need this vaccine if you have a specific risk factor for hepatitis B virus infection or you simply wish to be protected from this disease.  The vaccine is given in 3 doses, usually over 6 months. Preventive Services / Frequency Ages 9 to 48  Blood pressure check.** / Every 1 to 2 years.  Lipid and cholesterol check.** / Every 5 years beginning at age 60.  Clinical breast exam.** / Every 3 years for women in their 43s and 30s.  Pap test.** / Every 2 years from ages 80 through 4. Every 3 years starting at age 36 through age 36 or 81 with a history of 3 consecutive normal Pap tests.  HPV screening.** / Every 3 years from ages 1 through ages 66 to 59 with a history of 3 consecutive normal Pap tests.  Hepatitis C blood test.** / For any individual with known risks for hepatitis C.  Skin self-exam. / Monthly.  Influenza immunization.** / Every year.  Pneumococcal polysaccharide immunization.** / 1 to 2 doses if you smoke cigarettes or if you have certain chronic medical conditions.  Tetanus, diphtheria, pertussis (Tdap, Td) immunization. / A one-time dose of Tdap vaccine. After that, you need a Td booster dose every 10 years.  HPV immunization. / 3 doses over 6 months, if you are 32 and younger.  Measles, mumps, rubella (MMR) immunization. / You need at least 1 dose of MMR if you were born in 1957 or later. You may also need a second dose.  Meningococcal immunization. / 1 dose if you are age 34 to 55 and a first-year college student living in a residence hall, or have one of several medical conditions, you need to get vaccinated against meningococcal disease. You may also need additional booster doses.  Varicella immunization.** / Consult your caregiver.  Hepatitis A immunization.** / Consult your caregiver. 2 doses, 6 to 18 months apart.  Hepatitis B immunization.** / Consult your caregiver. 3 doses usually over 6 months. Ages 62 to 100  Blood pressure check.** / Every 1 to 2 years.  Lipid and cholesterol check.** / Every 5 years beginning at age 63.  Clinical breast exam.** / Every year after age  72.  Mammogram.** / Every year beginning at age 81 and continuing for as long as you are in good health. Consult with your caregiver.  Pap test.** / Every 3 years starting at age 56 through age 73 or 54 with a history of 3 consecutive normal Pap tests.  HPV screening.** / Every 3 years from ages 1 through ages 68 to 65 with a history of 3 consecutive normal Pap tests.  Fecal occult blood test (FOBT) of stool. / Every year beginning at age  50 and continuing until age 48. You may not need to do this test if you get a colonoscopy every 10 years.  Flexible sigmoidoscopy or colonoscopy.** / Every 5 years for a flexible sigmoidoscopy or every 10 years for a colonoscopy beginning at age 69 and continuing until age 34.  Hepatitis C blood test.** / For all people born from [birth year] through 1965 and any individual with known risks for hepatitis C.  Skin self-exam. / Monthly.  Influenza immunization.** / Every year.  Pneumococcal polysaccharide immunization.** / 1 to 2 doses if you smoke cigarettes or if you have certain chronic medical conditions.  Tetanus, diphtheria, pertussis (Tdap, Td) immunization.** / A one-time dose of Tdap vaccine. After that, you need a Td booster dose every 10 years.  Measles, mumps, rubella (MMR) immunization. / You need at least 1 dose of MMR if you were born in 1957 or later. You may also need a second dose.  Varicella immunization.** / Consult your caregiver.  Meningococcal immunization.** / Consult your caregiver.  Hepatitis A immunization.** / Consult your caregiver. 2 doses, 6 to 18 months apart.  Hepatitis B immunization.** / Consult your caregiver. 3 doses, usually over 6 months. Ages 57 and over  Blood pressure check.** / Every 1 to 2 years.  Lipid and cholesterol check.** / Every 5 years beginning at age 74.  Clinical breast exam.** / Every year after age 63.  Mammogram.** / Every year beginning at age 30 and continuing for as long as you are in good  health. Consult with your caregiver.  Pap test.** / Every 3 years starting at age 79 through age 72 or 63 with a 3 consecutive normal Pap tests. Testing can be stopped between 65 and 70 with 3 consecutive normal Pap tests and no abnormal Pap or HPV tests in the past 10 years.  HPV screening.** / Every 3 years from ages 65 through ages 50 or 8 with a history of 3 consecutive normal Pap tests. Testing can be stopped between 65 and 70 with 3 consecutive normal Pap tests and no abnormal Pap or HPV tests in the past 10 years.  Fecal occult blood test (FOBT) of stool. / Every year beginning at age 22 and continuing until age 56. You may not need to do this test if you get a colonoscopy every 10 years.  Flexible sigmoidoscopy or colonoscopy.** / Every 5 years for a flexible sigmoidoscopy or every 10 years for a colonoscopy beginning at age 31 and continuing until age 59.  Hepatitis C blood test.** / For all people born from [birth year] through 1965 and any individual with known risks for hepatitis C.  Osteoporosis screening.** / A one-time screening for women ages 73 and over and women at risk for fractures or osteoporosis.  Skin self-exam. / Monthly.  Influenza immunization.** / Every year.  Pneumococcal polysaccharide immunization.** / 1 dose at age 93 (or older) if you have never been vaccinated.  Tetanus, diphtheria, pertussis (Tdap, Td) immunization. / A one-time dose of Tdap vaccine if you are over 65 and have contact with an infant, are a Research scientist (physical sciences), or simply want to be protected from whooping cough. After that, you need a Td booster dose every 10 years.  Varicella immunization.** / Consult your caregiver.  Meningococcal immunization.** / Consult your caregiver.  Hepatitis A immunization.** / Consult your caregiver. 2 doses, 6 to 18 months apart.  Hepatitis B immunization.** / Check with your caregiver. 3 doses, usually over 6 months. ** Family history and  personal history of risk and  conditions may change your caregiver's recommendations. Document Released: 09/12/2001 Document Revised: 10/09/2011 Document Reviewed: 12/12/2010 [facility] Patient Information 2013 [place], [place].     Neta Mends. Nikitha Mode M.D.

## 2012-07-10 NOTE — Patient Instructions (Addendum)
Follow up with your orthopedist about her right upper extremity difficulty I agree you could have something related to the neck shoulder .   Also have them check the nodule in your right forearm.  I think you have an abdominal wall muscle pull wait another 3 or 4 weeks if getting worse instead of better or bulging at the site contact us and we can do a referral to surgery to check for hernia.  You should have a colonoscopy 10 years before your mom developed her cancer. If you have any blood in your stool he should be evaluated earlier you can do the stool cards screening in the meantime if you wish.  Your laboratory studies are great.  Preventive Care for Adults, Female A healthy lifestyle and preventive care can promote health and wellness. Preventive health guidelines for women include the following key practices.  A routine yearly physical is a good way to check with your caregiver about your health and preventive screening. It is a chance to share any concerns and updates on your health, and to receive a thorough exam.  Visit your dentist for a routine exam and preventive care every 6 months. Brush your teeth twice a day and floss once a day. Good oral hygiene prevents tooth decay and gum disease.  The frequency of eye exams is based on your age, health, family medical history, use of contact lenses, and other factors. Follow your caregiver's recommendations for frequency of eye exams.  Eat a healthy diet. Foods like vegetables, fruits, whole grains, low-fat dairy products, and lean protein foods contain the nutrients you need without too many calories. Decrease your intake of foods high in solid fats, added sugars, and salt. Eat the right amount of calories for you.Get information about a proper diet from your caregiver, if necessary.  Regular physical exercise is one of the most important things you can do for your health. Most adults should get at least 150 minutes of moderate-intensity  exercise (any activity that increases your heart rate and causes you to sweat) each week. In addition, most adults need muscle-strengthening exercises on 2 or more days a week.  Maintain a healthy weight. The body mass index (BMI) is a screening tool to identify possible weight problems. It provides an estimate of body fat based on height and weight. Your caregiver can help determine your BMI, and can help you achieve or maintain a healthy weight.For adults 20 years and older:  A BMI below 18.5 is considered underweight.  A BMI of 18.5 to 24.9 is normal.  A BMI of 25 to 29.9 is considered overweight.  A BMI of 30 and above is considered obese.  Maintain normal blood lipids and cholesterol levels by exercising and minimizing your intake of saturated fat. Eat a balanced diet with plenty of fruit and vegetables. Blood tests for lipids and cholesterol should begin at age 58 and be repeated every 5 years. If your lipid or cholesterol levels are high, you are over 50, or you are at high risk for heart disease, you may need your cholesterol levels checked more frequently.Ongoing high lipid and cholesterol levels should be treated with medicines if diet and exercise are not effective.  If you smoke, find out from your caregiver how to quit. If you do not use tobacco, do not start.  If you are pregnant, do not drink alcohol. If you are breastfeeding, be very cautious about drinking alcohol. If you are not pregnant and choose to drink alcohol, do  not exceed 1 drink per day. One drink is considered to be 12 ounces (355 mL) of beer, 5 ounces (148 mL) of wine, or 1.5 ounces (44 mL) of liquor.  Avoid use of street drugs. Do not share needles with anyone. Ask for help if you need support or instructions about stopping the use of drugs.  High blood pressure causes heart disease and increases the risk of stroke. Your blood pressure should be checked at least every 1 to 2 years. Ongoing high blood pressure  should be treated with medicines if weight loss and exercise are not effective.  If you are 93 to 46 years old, ask your caregiver if you should take aspirin to prevent strokes.  Diabetes screening involves taking a blood sample to check your fasting blood sugar level. This should be done once every 3 years, after age 14, if you are within normal weight and without risk factors for diabetes. Testing should be considered at a younger age or be carried out more frequently if you are overweight and have at least 1 risk factor for diabetes.  Breast cancer screening is essential preventive care for women. You should practice "breast self-awareness." This means understanding the normal appearance and feel of your breasts and may include breast self-examination. Any changes detected, no matter how small, should be reported to a caregiver. Women in their 47s and 30s should have a clinical breast exam (CBE) by a caregiver as part of a regular health exam every 1 to 3 years. After age 46, women should have a CBE every year. Starting at age 31, women should consider having a mammography (breast X-ray test) every year. Women who have a family history of breast cancer should talk to their caregiver about genetic screening. Women at a high risk of breast cancer should talk to their caregivers about having magnetic resonance imaging (MRI) and a mammography every year.  The Pap test is a screening test for cervical cancer. A Pap test can show cell changes on the cervix that might become cervical cancer if left untreated. A Pap test is a procedure in which cells are obtained and examined from the lower end of the uterus (cervix).  Women should have a Pap test starting at age 44.  Between ages 56 and 74, Pap tests should be repeated every 2 years.  Beginning at age 42, you should have a Pap test every 3 years as long as the past 3 Pap tests have been normal.  Some women have medical problems that increase the chance  of getting cervical cancer. Talk to your caregiver about these problems. It is especially important to talk to your caregiver if a new problem develops soon after your last Pap test. In these cases, your caregiver may recommend more frequent screening and Pap tests.  The above recommendations are the same for women who have or have not gotten the vaccine for human papillomavirus (HPV).  If you had a hysterectomy for a problem that was not cancer or a condition that could lead to cancer, then you no longer need Pap tests. Even if you no longer need a Pap test, a regular exam is a good idea to make sure no other problems are starting.  If you are between ages 65 and 62, and you have had normal Pap tests going back 10 years, you no longer need Pap tests. Even if you no longer need a Pap test, a regular exam is a good idea to make sure no other  problems are starting.  If you have had past treatment for cervical cancer or a condition that could lead to cancer, you need Pap tests and screening for cancer for at least 20 years after your treatment.  If Pap tests have been discontinued, risk factors (such as a new sexual partner) need to be reassessed to determine if screening should be resumed.  The HPV test is an additional test that may be used for cervical cancer screening. The HPV test looks for the virus that can cause the cell changes on the cervix. The cells collected during the Pap test can be tested for HPV. The HPV test could be used to screen women aged 27 years and older, and should be used in women of any age who have unclear Pap test results. After the age of 68, women should have HPV testing at the same frequency as a Pap test.  Colorectal cancer can be detected and often prevented. Most routine colorectal cancer screening begins at the age of 10 and continues through age 34. However, your caregiver may recommend screening at an earlier age if you have risk factors for colon cancer. On a yearly  basis, your caregiver may provide home test kits to check for hidden blood in the stool. Use of a small camera at the end of a tube, to directly examine the colon (sigmoidoscopy or colonoscopy), can detect the earliest forms of colorectal cancer. Talk to your caregiver about this at age 50, when routine screening begins. Direct examination of the colon should be repeated every 5 to 10 years through age 61, unless early forms of pre-cancerous polyps or small growths are found.  Hepatitis C blood testing is recommended for all people born from [birth year] through 1965 and any individual with known risks for hepatitis C.  Practice safe sex. Use condoms and avoid high-risk sexual practices to reduce the spread of sexually transmitted infections (STIs). STIs include gonorrhea, chlamydia, syphilis, trichomonas, herpes, HPV, and human immunodeficiency virus (HIV). Herpes, HIV, and HPV are viral illnesses that have no cure. They can result in disability, cancer, and death. Sexually active women aged 5 and younger should be checked for chlamydia. Older women with new or multiple partners should also be tested for chlamydia. Testing for other STIs is recommended if you are sexually active and at increased risk.  Osteoporosis is a disease in which the bones lose minerals and strength with aging. This can result in serious bone fractures. The risk of osteoporosis can be identified using a bone density scan. Women ages 27 and over and women at risk for fractures or osteoporosis should discuss screening with their caregivers. Ask your caregiver whether you should take a calcium supplement or vitamin D to reduce the rate of osteoporosis.  Menopause can be associated with physical symptoms and risks. Hormone replacement therapy is available to decrease symptoms and risks. You should talk to your caregiver about whether hormone replacement therapy is right for you.  Use sunscreen with sun protection factor (SPF) of 30 or more.  Apply sunscreen liberally and repeatedly throughout the day. You should seek shade when your shadow is shorter than you. Protect yourself by wearing long sleeves, pants, a wide-brimmed hat, and sunglasses year round, whenever you are outdoors.  Once a month, do a whole body skin exam, using a mirror to look at the skin on your back. Notify your caregiver of new moles, moles that have irregular borders, moles that are larger than a pencil eraser, or moles that  have changed in shape or color.  Stay current with required immunizations.  Influenza. You need a dose every fall (or winter). The composition of the flu vaccine changes each year, so being vaccinated once is not enough.  Pneumococcal polysaccharide. You need 1 to 2 doses if you smoke cigarettes or if you have certain chronic medical conditions. You need 1 dose at age 9 (or older) if you have never been vaccinated.  Tetanus, diphtheria, pertussis (Tdap, Td). Get 1 dose of Tdap vaccine if you are younger than age 45, are over 69 and have contact with an infant, are a Research scientist (physical sciences), are pregnant, or simply want to be protected from whooping cough. After that, you need a Td booster dose every 10 years. Consult your caregiver if you have not had at least 3 tetanus and diphtheria-containing shots sometime in your life or have a deep or dirty wound.  HPV. You need this vaccine if you are a woman age 26 or younger. The vaccine is given in 3 doses over 6 months.  Measles, mumps, rubella (MMR). You need at least 1 dose of MMR if you were born in 1957 or later. You may also need a second dose.  Meningococcal. If you are age 56 to 10 and a first-year college student living in a residence hall, or have one of several medical conditions, you need to get vaccinated against meningococcal disease. You may also need additional booster doses.  Zoster (shingles). If you are age 58 or older, you should get this vaccine.  Varicella (chickenpox). If you have  never had chickenpox or you were vaccinated but received only 1 dose, talk to your caregiver to find out if you need this vaccine.  Hepatitis A. You need this vaccine if you have a specific risk factor for hepatitis A virus infection or you simply wish to be protected from this disease. The vaccine is usually given as 2 doses, 6 to 18 months apart.  Hepatitis B. You need this vaccine if you have a specific risk factor for hepatitis B virus infection or you simply wish to be protected from this disease. The vaccine is given in 3 doses, usually over 6 months. Preventive Services / Frequency Ages 48 to 10  Blood pressure check.** / Every 1 to 2 years.  Lipid and cholesterol check.** / Every 5 years beginning at age 39.  Clinical breast exam.** / Every 3 years for women in their 75s and 30s.  Pap test.** / Every 2 years from ages 54 through 12. Every 3 years starting at age 24 through age 65 or 70 with a history of 3 consecutive normal Pap tests.  HPV screening.** / Every 3 years from ages 68 through ages 12 to 45 with a history of 3 consecutive normal Pap tests.  Hepatitis C blood test.** / For any individual with known risks for hepatitis C.  Skin self-exam. / Monthly.  Influenza immunization.** / Every year.  Pneumococcal polysaccharide immunization.** / 1 to 2 doses if you smoke cigarettes or if you have certain chronic medical conditions.  Tetanus, diphtheria, pertussis (Tdap, Td) immunization. / A one-time dose of Tdap vaccine. After that, you need a Td booster dose every 10 years.  HPV immunization. / 3 doses over 6 months, if you are 11 and younger.  Measles, mumps, rubella (MMR) immunization. / You need at least 1 dose of MMR if you were born in 1957 or later. You may also need a second dose.  Meningococcal immunization. /  1 dose if you are age 57 to 4 and a first-year college student living in a residence hall, or have one of several medical conditions, you need to get  vaccinated against meningococcal disease. You may also need additional booster doses.  Varicella immunization.** / Consult your caregiver.  Hepatitis A immunization.** / Consult your caregiver. 2 doses, 6 to 18 months apart.  Hepatitis B immunization.** / Consult your caregiver. 3 doses usually over 6 months. Ages 62 to 10  Blood pressure check.** / Every 1 to 2 years.  Lipid and cholesterol check.** / Every 5 years beginning at age 2.  Clinical breast exam.** / Every year after age 38.  Mammogram.** / Every year beginning at age 11 and continuing for as long as you are in good health. Consult with your caregiver.  Pap test.** / Every 3 years starting at age 40 through age 61 or 64 with a history of 3 consecutive normal Pap tests.  HPV screening.** / Every 3 years from ages 21 through ages 57 to 29 with a history of 3 consecutive normal Pap tests.  Fecal occult blood test (FOBT) of stool. / Every year beginning at age 39 and continuing until age 45. You may not need to do this test if you get a colonoscopy every 10 years.  Flexible sigmoidoscopy or colonoscopy.** / Every 5 years for a flexible sigmoidoscopy or every 10 years for a colonoscopy beginning at age 14 and continuing until age 73.  Hepatitis C blood test.** / For all people born from [birth year] through 1965 and any individual with known risks for hepatitis C.  Skin self-exam. / Monthly.  Influenza immunization.** / Every year.  Pneumococcal polysaccharide immunization.** / 1 to 2 doses if you smoke cigarettes or if you have certain chronic medical conditions.  Tetanus, diphtheria, pertussis (Tdap, Td) immunization.** / A one-time dose of Tdap vaccine. After that, you need a Td booster dose every 10 years.  Measles, mumps, rubella (MMR) immunization. / You need at least 1 dose of MMR if you were born in 1957 or later. You may also need a second dose.  Varicella immunization.** / Consult your caregiver.  Meningococcal  immunization.** / Consult your caregiver.  Hepatitis A immunization.** / Consult your caregiver. 2 doses, 6 to 18 months apart.  Hepatitis B immunization.** / Consult your caregiver. 3 doses, usually over 6 months. Ages 89 and over  Blood pressure check.** / Every 1 to 2 years.  Lipid and cholesterol check.** / Every 5 years beginning at age 64.  Clinical breast exam.** / Every year after age 66.  Mammogram.** / Every year beginning at age 38 and continuing for as long as you are in good health. Consult with your caregiver.  Pap test.** / Every 3 years starting at age 24 through age 12 or 16 with a 3 consecutive normal Pap tests. Testing can be stopped between 65 and 70 with 3 consecutive normal Pap tests and no abnormal Pap or HPV tests in the past 10 years.  HPV screening.** / Every 3 years from ages 59 through ages 50 or 48 with a history of 3 consecutive normal Pap tests. Testing can be stopped between 65 and 70 with 3 consecutive normal Pap tests and no abnormal Pap or HPV tests in the past 10 years.  Fecal occult blood test (FOBT) of stool. / Every year beginning at age 37 and continuing until age 62. You may not need to do this test if you get a colonoscopy  every 10 years.  Flexible sigmoidoscopy or colonoscopy.** / Every 5 years for a flexible sigmoidoscopy or every 10 years for a colonoscopy beginning at age 75 and continuing until age 57.  Hepatitis C blood test.** / For all people born from [birth year] through 1965 and any individual with known risks for hepatitis C.  Osteoporosis screening.** / A one-time screening for women ages 36 and over and women at risk for fractures or osteoporosis.  Skin self-exam. / Monthly.  Influenza immunization.** / Every year.  Pneumococcal polysaccharide immunization.** / 1 dose at age 67 (or older) if you have never been vaccinated.  Tetanus, diphtheria, pertussis (Tdap, Td) immunization. / A one-time dose of Tdap vaccine if you are over 65 and  have contact with an infant, are a Research scientist (physical sciences), or simply want to be protected from whooping cough. After that, you need a Td booster dose every 10 years.  Varicella immunization.** / Consult your caregiver.  Meningococcal immunization.** / Consult your caregiver.  Hepatitis A immunization.** / Consult your caregiver. 2 doses, 6 to 18 months apart.  Hepatitis B immunization.** / Check with your caregiver. 3 doses, usually over 6 months. ** Family history and personal history of risk and conditions may change your caregiver's recommendations. Document Released: 09/12/2001 Document Revised: 10/09/2011 Document Reviewed: 12/12/2010 [facility] Patient Information 2013 [place], [place].

## 2012-07-12 ENCOUNTER — Encounter: Payer: Self-pay | Admitting: Internal Medicine

## 2012-07-12 DIAGNOSIS — Z800 Family history of malignant neoplasm of digestive organs: Secondary | ICD-10-CM | POA: Insufficient documentation

## 2013-06-04 ENCOUNTER — Encounter: Payer: Self-pay | Admitting: Internal Medicine

## 2013-07-04 ENCOUNTER — Encounter: Payer: Self-pay | Admitting: Internal Medicine

## 2013-08-27 ENCOUNTER — Other Ambulatory Visit (INDEPENDENT_AMBULATORY_CARE_PROVIDER_SITE_OTHER): Payer: PRIVATE HEALTH INSURANCE

## 2013-08-27 DIAGNOSIS — Z0000 Encounter for general adult medical examination without abnormal findings: Secondary | ICD-10-CM

## 2013-08-27 LAB — CBC WITH DIFFERENTIAL/PLATELET
BASOS ABS: 0 10*3/uL (ref 0.0–0.1)
Basophils Relative: 0.5 % (ref 0.0–3.0)
EOS PCT: 0.6 % (ref 0.0–5.0)
Eosinophils Absolute: 0 10*3/uL (ref 0.0–0.7)
HCT: 39.6 % (ref 36.0–46.0)
HEMOGLOBIN: 12.9 g/dL (ref 12.0–15.0)
LYMPHS ABS: 1.2 10*3/uL (ref 0.7–4.0)
Lymphocytes Relative: 22.8 % (ref 12.0–46.0)
MCHC: 32.6 g/dL (ref 30.0–36.0)
MCV: 88.7 fl (ref 78.0–100.0)
MONO ABS: 0.4 10*3/uL (ref 0.1–1.0)
Monocytes Relative: 7.8 % (ref 3.0–12.0)
NEUTROS ABS: 3.6 10*3/uL (ref 1.4–7.7)
Neutrophils Relative %: 68.3 % (ref 43.0–77.0)
Platelets: 286 10*3/uL (ref 150.0–400.0)
RBC: 4.46 Mil/uL (ref 3.87–5.11)
RDW: 12.6 % (ref 11.5–14.6)
WBC: 5.3 10*3/uL (ref 4.5–10.5)

## 2013-08-27 LAB — LIPID PANEL
Cholesterol: 214 mg/dL — ABNORMAL HIGH (ref 0–200)
HDL: 67.5 mg/dL (ref >39.00)
TRIGLYCERIDES: 61 mg/dL (ref 0.0–149.0)
Total CHOL/HDL Ratio: 3
VLDL: 12.2 mg/dL (ref 0.0–40.0)

## 2013-08-27 LAB — HEPATIC FUNCTION PANEL
ALBUMIN: 4.3 g/dL (ref 3.5–5.2)
ALK PHOS: 48 U/L (ref 39–117)
ALT: 10 U/L (ref 0–35)
AST: 14 U/L (ref 0–37)
BILIRUBIN TOTAL: 0.9 mg/dL (ref 0.3–1.2)
Bilirubin, Direct: 0 mg/dL (ref 0.0–0.3)
TOTAL PROTEIN: 7.2 g/dL (ref 6.0–8.3)

## 2013-08-27 LAB — BASIC METABOLIC PANEL
BUN: 19 mg/dL (ref 6–23)
CHLORIDE: 108 meq/L (ref 96–112)
CO2: 24 meq/L (ref 19–32)
CREATININE: 0.8 mg/dL (ref 0.4–1.2)
Calcium: 9.1 mg/dL (ref 8.4–10.5)
GFR: 85.16 mL/min (ref >60.00)
GLUCOSE: 106 mg/dL — AB (ref 70–99)
Potassium: 3.8 mEq/L (ref 3.5–5.1)
SODIUM: 138 meq/L (ref 135–145)

## 2013-08-27 LAB — TSH: TSH: 1.2 u[IU]/mL (ref 0.35–5.50)

## 2013-08-27 LAB — LDL CHOLESTEROL, DIRECT: Direct LDL: 136.9 mg/dL

## 2013-09-03 ENCOUNTER — Encounter: Payer: 59 | Admitting: Internal Medicine

## 2013-09-08 ENCOUNTER — Encounter: Payer: 59 | Admitting: Internal Medicine

## 2013-09-08 ENCOUNTER — Encounter: Payer: Self-pay | Admitting: Internal Medicine

## 2013-09-08 NOTE — Progress Notes (Signed)
Document opened and reviewed for OV cpx but appt  canceled same day .was on her period and though she should wait

## 2013-09-24 ENCOUNTER — Other Ambulatory Visit (HOSPITAL_COMMUNITY)
Admission: RE | Admit: 2013-09-24 | Discharge: 2013-09-24 | Disposition: A | Discharge status: Home / Self Care | Payer: No Typology Code available for payment source | Source: Ambulatory Visit | Attending: Internal Medicine | Admitting: Internal Medicine

## 2013-09-24 ENCOUNTER — Ambulatory Visit (INDEPENDENT_AMBULATORY_CARE_PROVIDER_SITE_OTHER): Payer: PRIVATE HEALTH INSURANCE | Admitting: Internal Medicine

## 2013-09-24 ENCOUNTER — Encounter: Payer: Self-pay | Admitting: Internal Medicine

## 2013-09-24 VITALS — BP 102/66 | HR 75 | Temp 99.0°F | Ht 61.75 in | Wt 114.0 lb

## 2013-09-24 DIAGNOSIS — Z1151 Encounter for screening for human papillomavirus (HPV): Secondary | ICD-10-CM | POA: Insufficient documentation

## 2013-09-24 DIAGNOSIS — Z01419 Encounter for gynecological examination (general) (routine) without abnormal findings: Secondary | ICD-10-CM

## 2013-09-24 DIAGNOSIS — Z23 Encounter for immunization: Secondary | ICD-10-CM

## 2013-09-24 DIAGNOSIS — R7309 Other abnormal glucose: Secondary | ICD-10-CM

## 2013-09-24 DIAGNOSIS — Z0000 Encounter for general adult medical examination without abnormal findings: Secondary | ICD-10-CM

## 2013-09-24 DIAGNOSIS — Z800 Family history of malignant neoplasm of digestive organs: Secondary | ICD-10-CM

## 2013-09-24 DIAGNOSIS — R739 Hyperglycemia, unspecified: Secondary | ICD-10-CM | POA: Insufficient documentation

## 2013-09-24 NOTE — Patient Instructions (Signed)
Continue lifestyle intervention healthy eating and exercise . 150 minutes of exercise weeks  , weight  To healthy levels. Avoid trans fats and processed foods;  Increase fresh fruits and veges to 5 servings per day. And avoid sweet beverages  Including tea and juice. Will notify you  of pap when available. Otherwise yearly check.

## 2013-09-24 NOTE — Progress Notes (Signed)
Chief Complaint  Patient presents with  . Annual Exam    HPI: Patient comes in today for [place] visit  Since her last visit she has seen Percell Miller waning for a number of things including leg pain bursitis. And some back issues. Has been in physical therapy. Now has some right ankle pain but no injury did see the neurologist and had an injection in the spot which helped for a while but didn't really want to do this. Has only an occasional shooting pain since that time. Has had a recent death in the family and just found out her and got terribly sick and they're taking her off the ventilator this week. She's not depressed but somewhat sad with this grief. Has some varicose veins and because of family history of artery disease and stents has some questions about that but no claudication or  atypical swelling.  Health Maintenance  Topic Date Due  . Pap Smear  04/13/2013  . Influenza Vaccine  02/28/2014  . Mammogram  07/02/2014  . Tetanus/tdap  09/25/2023   Health Maintenance Review Neg tad ROS:  GEN/ HEENT: No fever, significant weight changes sweats headaches vision problems hearing changes, CV/ PULM; No chest pain shortness of breath cough, syncope,edema  change in exercise tolerance. GI /GU: No adominal pain, vomiting, change in bowel habits. No blood in the stool. No significant GU symptoms. SKIN/HEME: ,no acute skin rashes suspicious lesions or bleeding. No lymphadenopathy, nodules, masses.  NEURO/ PSYCH:  No neurologic signs such as weakness numbness. No depression anxiety. IMM/ Allergy: No unusual infections.  Allergy .   REST of 12 system review negative except as per HPI   Past Medical History  Diagnosis Date  . Varicose veins   . Headache(784.0)   . Heart murmur     Family History  Problem Relation Age of Onset  . Arthritis    . Diabetes    . Hyperlipidemia    . Hypertension    . Prostate cancer    . Stroke    . Heart disease    . Colon cancer  Mother    father had stents.  History   Social History  . Marital Status: Married    Spouse Name: N/A    Number of Children: N/A  . Years of Education: N/A   Social History Main Topics  . Smoking status: Never Smoker   . Smokeless tobacco: None  . Alcohol Use: No  . Drug Use: No  . Sexual Activity: None   Other Topics Concern  . None   Social History Narrative   Occupation: post office   Married   Regular exercise- yes   HH of 4   Pet dog not ets    Outpatient Encounter Prescriptions as of 09/24/2013  Medication Sig  . Biotin (BIOTIN 5000) 5 MG CAPS Take by mouth.  Marland Kitchen MAGNESIUM PO Take by mouth.  . Omega 3 1000 MG CAPS Take by mouth.    EXAM:  BP 102/66  Pulse 75  Temp(Src) 99 F (37.2 C) (Oral)  Ht 5' 1.75" (1.568 m)  Wt 114 lb (51.71 kg)  BMI 21.03 kg/m2  SpO2 99%  LMP 09/07/2013  Body mass index is 21.03 kg/(m^2).  Physical Exam: Vital signs reviewed [id number]:[id number] is a well-developed well-nourished alert cooperative   female who appears her stated age in no acute distress.  HEENT: normocephalic atraumatic , Eyes: PERRL EOM's full, conjunctiva clear, Nares: paten,t no deformity discharge or tenderness., Ears: no deformity  EAC's clear TMs with normal landmarks. Mouth: clear OP, no lesions, edema.  Moist mucous membranes. Dentition in adequate repair. NECK: supple without masses, thyromegaly or bruits. CHEST/PULM:  Clear to auscultation and percussion breath sounds equal no wheeze , rales or rhonchi. No chest wall deformities or tenderness. Breast: normal by inspection . No dimpling, discharge, masses, tenderness or discharge . CV: PMI is nondisplaced, S1 S2 no gallops, murmurs, rubs. Peripheral pulses are full without delay.No JVD .  ABDOMEN: Bowel sounds normal nontender  No guard or rebound, no hepato splenomegal no CVA tenderness.  No hernia. Extremtities:  No clubbing cyanosis or edema, no acute joint swelling or redness no focal atrophy does have varicose  veins no acute changes. NEURO:  Oriented x3, cranial nerves 3-12 appear to be intact, no obvious focal weakness,gait within normal limits no abnormal reflexes or asymmetrical SKIN: No acute rashes normal turgor, color, no bruising or petechiae. PSYCH: Oriented, good eye contact, no obvious depression anxiety, cognition and judgment appear normal. LN: no cervical axillary inguinal adenopathy Pelvic: NL ext GU, labia clear without lesions or rash . Vagina no lesions .Cervix: clear  UTERUS: Neg CMT Adnexa:  clear no masses . PAP doneHPV  Lab Results  Component Value Date   WBC 5.3 08/27/2013   HGB 12.9 08/27/2013   HCT 39.6 08/27/2013   PLT 286.0 08/27/2013   GLUCOSE 106* 08/27/2013   CHOL 214* 08/27/2013   TRIG 61.0 08/27/2013   HDL 67.50 08/27/2013   LDLDIRECT 136.9 08/27/2013   LDLCALC 112* 07/03/2012   ALT 10 08/27/2013   AST 14 08/27/2013   NA 138 08/27/2013   K 3.8 08/27/2013   CL 108 08/27/2013   CREATININE 0.8 08/27/2013   BUN 19 08/27/2013   CO2 24 08/27/2013   TSH 1.20 08/27/2013    ASSESSMENT AND PLAN:  Discussed the following assessment and plan:  Visit for preventive health examination - Plan: PAP [[place]]  Family hx of colon cancer  Need for Tdap vaccination - Plan: Tdap vaccine greater than or equal to 7yo IM  Encounter for routine gynecological examination - Plan: PAP [[place]] Family history vascular disease Reviewed laboratory findings avoid sweet drinks prevention diabetes. Counseled regarding healthy nutrition, exercise, sleep, injury prevention, calcium vit d and healthy weight .  Patient Care Team: Burnis Medin, MD as PCP - General Lorn Junes, MD (Orthopedic Surgery) Patient Instructions  Continue lifestyle intervention healthy eating and exercise . 150 minutes of exercise weeks  , weight  To healthy levels. Avoid trans fats and processed foods;  Increase fresh fruits and veges to 5 servings per day. And avoid sweet beverages  Including tea and  juice. Will notify you  of pap when available. Otherwise yearly check.      Standley Brooking. Jordynn Perrier M.D.   Pre visit review using our clinic review tool, if applicable. No additional management support is needed unless otherwise documented below in the visit note.

## 2013-09-26 NOTE — Progress Notes (Signed)
Quick Note:  Tell patient PAP is normal. HPV high risk is negative ______ 

## 2013-09-30 ENCOUNTER — Encounter: Payer: Self-pay | Admitting: Family Medicine

## 2013-11-17 ENCOUNTER — Ambulatory Visit (INDEPENDENT_AMBULATORY_CARE_PROVIDER_SITE_OTHER): Payer: PRIVATE HEALTH INSURANCE | Admitting: Internal Medicine

## 2013-11-17 ENCOUNTER — Encounter: Payer: Self-pay | Admitting: Internal Medicine

## 2013-11-17 VITALS — BP 106/70 | Temp 99.0°F | Ht 61.75 in | Wt 115.0 lb

## 2013-11-17 DIAGNOSIS — R229 Localized swelling, mass and lump, unspecified: Secondary | ICD-10-CM

## 2013-11-17 DIAGNOSIS — M542 Cervicalgia: Secondary | ICD-10-CM

## 2013-11-17 DIAGNOSIS — M79601 Pain in right arm: Secondary | ICD-10-CM | POA: Insufficient documentation

## 2013-11-17 DIAGNOSIS — R2230 Localized swelling, mass and lump, unspecified upper limb: Secondary | ICD-10-CM | POA: Insufficient documentation

## 2013-11-17 DIAGNOSIS — M79609 Pain in unspecified limb: Secondary | ICD-10-CM

## 2013-11-17 NOTE — Progress Notes (Signed)
Chief Complaint  Patient presents with  . Follow-up    Pt complains of wrist pain and neck pain.  Neck pain is described as "nagging".  Wrist pain makes it hard to grip objects and type.    HPI: Comes in for continuing problems with right arm wrist and right neck . 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   Mri  2014 . Was told " nothing there. "   Was in fall or so.  Dominant hand pain is problematic   Neck pressure right also  On going even with " shot in head" for HA  ? Not help     But   ?Got physical therapy at some p[oint fopr neck. And back  As she had shoulder surgery per M and W   Nagging   . Pain continuing and  Now Hard to sleep.  Used to take muscle relaxant safter surgery no hlep no radiation of pain   . hasn't taken vacation recently so hard to tell if rest helps    ROS: See pertinent positives and negatives per HPI.no weakness falling  Injury  7 30 - 6 pm. Is effective work hours   Past Medical History  Diagnosis Date  . Varicose veins   . Headache(784.0)   . Heart murmur     Family History  Problem Relation Age of Onset  . Arthritis    . Diabetes    . Hyperlipidemia    . Hypertension    . Prostate cancer    . Stroke    . Heart disease    . Colon cancer Mother     History   Social History  . Marital Status: Married    Spouse Name: N/A    Number of Children: N/A  . Years of Education: N/A   Social History Main Topics  . Smoking status: Never Smoker   . Smokeless tobacco: None  . Alcohol Use: No  . Drug Use: No  . Sexual Activity: None   Other Topics Concern  . None   Social History Narrative   Occupation: post office   Married   Regular exercise- yes   HH of 4   Pet dog not ets    Outpatient Encounter Prescriptions as of 11/17/2013  Medication Sig  . BIOTIN PO Take 10,000 mg by mouth.  Marland Kitchen  MAGNESIUM PO Take 250 mg by mouth.   . Naproxen Sod-Diphenhydramine (ALEVE PM) 220-25 MG TABS Take 1 tablet by mouth at bedtime as needed.  . naproxen sodium (ANAPROX) 220 MG tablet Take 440 mg by mouth daily.  . Omega 3 1000 MG CAPS Take by mouth.  . [DISCONTINUED] Biotin (BIOTIN 5000) 5 MG CAPS Take by mouth.    EXAM:  BP 106/70  Temp(Src) 99 F (37.2 C) (Oral)  Ht 5' 1.75" (1.568 m)  Wt 115 lb (52.164 kg)  BMI 21.22 kg/m2  Body mass index is 21.22 kg/(m^2).  GENERAL: vitals reviewed and listed above, alert, oriented, appears well hydrated and in no acute distress HEENT: atraumatic, conjunctiva  clear, no obvious abnormalities on inspection of external nose and ears OP : no lesion edema or exudate  NECK: no obvious masses on inspection palpation  Tender at right paracervical  Insertion tight muscle .  LUNGS: clear to auscultation bilaterally, no wheezes, rales or rhonchi,  good air movement CV: HRRR, no clubbing cyanosis or  peripheral edema nl cap refill  MS: moves all extremities  Right forearm with 2-3 cm soft cystic lesion tender  On forearm  No atrophy noted  Pulsed intact  PSYCH:  cooperative, cognition intact   ASSESSMENT AND PLAN:  Discussed the following assessment and plan:  Neck pain on right side - on going waxing and waning ha injec didnt help .had pt in past for back ? not for neckl no alarm sx except hurst at night and hard to get comfortable .  - Plan: Ambulatory referral to Orthopedic Surgery  Skin lump of arm right  - soft but large 2-3 cm over extensor forearm reported had mri 2 years ago  murphy wainer larger and symptoms  ; refer to hand specialsit  - Plan: Ambulatory referral to Hand Surgery  Arm pain, right/wrist.  - Plan: Ambulatory referral to Hand Surgery Exercises given 2 pages reagarding neck  -Patient advised to return or notify health care team  if symptoms worsen ,persist or new concerns arise.  Patient Instructions  i advise seeing a hand  specialist about the   Hand and arm cyst lump area .  We can do a referral to hand upper extremity .   Will get appt for neck   Again.   With [facility] as they have your records regarding this evaluation.   Consider  revisit physical therapy   Standley Brooking. Panosh M.D.  Pre visit review using our clinic review tool, if applicable. No additional management support is needed unless otherwise documented below in the visit note. Total visit 73mins > 50% spent counseling and coordinating care

## 2013-11-17 NOTE — Patient Instructions (Addendum)
i advise seeing a hand specialist about the   Hand and arm cyst lump area .  We can do a referral to hand upper extremity .   Will get appt for neck   Again.   With [facility] as they have your records regarding this evaluation.   Consider  revisit physical therapy

## 2013-11-21 ENCOUNTER — Telehealth: Payer: Self-pay | Admitting: Internal Medicine

## 2013-11-21 NOTE — Telephone Encounter (Signed)
Appt Dr  Elisabeth Most with pt appt confirmed

## 2013-11-21 NOTE — Telephone Encounter (Signed)
Pt was referred to Dr. Charlotte Crumb at the [facility], pt called to inform us that his office does not take her insurance.  Can pt be referred elsewhere?  Please advise.

## 2014-10-14 ENCOUNTER — Telehealth: Payer: Self-pay | Admitting: Internal Medicine

## 2014-10-14 NOTE — Telephone Encounter (Signed)
Pt states dr Grandville Silos from [facility] has sent requesting for pt get labs done here.  Dr Grandville Silos has sent 3 x. Do you have the order? (For rheumatoid arthritis). Is it ok for pt to here done here asap? If you do not have, I can call over there and have them to send.

## 2014-10-14 NOTE — Telephone Encounter (Signed)
Pt aware of findings.  Pt would like dr Regis Bill to do her lab work up for her condition before cpe labs on 4/6.  Pt does not want to wait until then.

## 2014-10-14 NOTE — Telephone Encounter (Signed)
Unless it arrived on my desk this morning I do not have it.

## 2014-10-14 NOTE — Telephone Encounter (Signed)
Per [facility], dr Grandville Silos did not send over a lab order.  He would like dr Regis Bill to review his office notes and consider work up for rheumatoid arthritis. Notes given to misty. LM on VM for pt to cb. Will give pt info at that time.

## 2014-10-14 NOTE — Telephone Encounter (Signed)
Called [facility]. LM w/ dr thompson's asst to send over lab order.

## 2014-10-14 NOTE — Telephone Encounter (Signed)
See office note

## 2014-10-19 NOTE — Telephone Encounter (Signed)
Please   Get office note to reviewe  Advise  Office visit before ordering tests . If she wishes we can do  A "double visit" at her cpx and do labs that day   or do separate visit .

## 2014-10-20 NOTE — Telephone Encounter (Signed)
Would do labs at the visit    i need to talk and see her first with her first  To decide on testing  Which tests to do .  Have her cancel her lab appt and plan on labs at the visit

## 2014-10-20 NOTE — Telephone Encounter (Signed)
Please advise if we can schedule labs pre visit. Pt following up on request.

## 2014-10-21 NOTE — Telephone Encounter (Signed)
Pt aware and will come in fasting

## 2014-11-04 ENCOUNTER — Other Ambulatory Visit: Payer: PRIVATE HEALTH INSURANCE

## 2014-11-11 ENCOUNTER — Ambulatory Visit (INDEPENDENT_AMBULATORY_CARE_PROVIDER_SITE_OTHER): Payer: PRIVATE HEALTH INSURANCE | Admitting: Internal Medicine

## 2014-11-11 ENCOUNTER — Encounter: Payer: Self-pay | Admitting: Internal Medicine

## 2014-11-11 VITALS — BP 112/66 | Temp 98.4°F | Ht 61.5 in | Wt 108.3 lb

## 2014-11-11 DIAGNOSIS — E785 Hyperlipidemia, unspecified: Secondary | ICD-10-CM | POA: Diagnosis not present

## 2014-11-11 DIAGNOSIS — M2560 Stiffness of unspecified joint, not elsewhere classified: Secondary | ICD-10-CM | POA: Diagnosis not present

## 2014-11-11 DIAGNOSIS — Z0000 Encounter for general adult medical examination without abnormal findings: Secondary | ICD-10-CM

## 2014-11-11 DIAGNOSIS — R739 Hyperglycemia, unspecified: Secondary | ICD-10-CM | POA: Diagnosis not present

## 2014-11-11 DIAGNOSIS — R1011 Right upper quadrant pain: Secondary | ICD-10-CM

## 2014-11-11 DIAGNOSIS — M2550 Pain in unspecified joint: Secondary | ICD-10-CM

## 2014-11-11 DIAGNOSIS — Z800 Family history of malignant neoplasm of digestive organs: Secondary | ICD-10-CM

## 2014-11-11 DIAGNOSIS — Z8249 Family history of ischemic heart disease and other diseases of the circulatory system: Secondary | ICD-10-CM

## 2014-11-11 DIAGNOSIS — R1033 Periumbilical pain: Secondary | ICD-10-CM

## 2014-11-11 LAB — CBC WITH DIFFERENTIAL/PLATELET
BASOS PCT: 0.3 % (ref 0.0–3.0)
Basophils Absolute: 0 10*3/uL (ref 0.0–0.1)
EOS ABS: 0 10*3/uL (ref 0.0–0.7)
EOS PCT: 0.2 % (ref 0.0–5.0)
HCT: 40.9 % (ref 36.0–46.0)
Hemoglobin: 13.9 g/dL (ref 12.0–15.0)
LYMPHS PCT: 18.2 % (ref 12.0–46.0)
Lymphs Abs: 1.5 10*3/uL (ref 0.7–4.0)
MCHC: 34 g/dL (ref 30.0–36.0)
MCV: 85.9 fl (ref 78.0–100.0)
MONO ABS: 0.6 10*3/uL (ref 0.1–1.0)
Monocytes Relative: 7 % (ref 3.0–12.0)
Neutro Abs: 6.3 10*3/uL (ref 1.4–7.7)
Neutrophils Relative %: 74.3 % (ref 43.0–77.0)
Platelets: 344 10*3/uL (ref 150.0–400.0)
RBC: 4.76 Mil/uL (ref 3.87–5.11)
RDW: 12.5 % (ref 11.5–15.5)
WBC: 8.4 10*3/uL (ref 4.0–10.5)

## 2014-11-11 LAB — POCT URINALYSIS DIP (MANUAL ENTRY)
BILIRUBIN UA: NEGATIVE
GLUCOSE UA: NEGATIVE
LEUKOCYTES UA: NEGATIVE
Nitrite, UA: NEGATIVE
Protein Ur, POC: NEGATIVE
Spec Grav, UA: >=1.03
UROBILINOGEN UA: 0.2
pH, UA: 5.5

## 2014-11-11 LAB — HEPATIC FUNCTION PANEL
ALT: 9 U/L (ref 0–35)
AST: 12 U/L (ref 0–37)
Albumin: 4.7 g/dL (ref 3.5–5.2)
Alkaline Phosphatase: 53 U/L (ref 39–117)
Bilirubin, Direct: 0.1 mg/dL (ref 0.0–0.3)
Total Bilirubin: 0.6 mg/dL (ref 0.2–1.2)
Total Protein: 7.6 g/dL (ref 6.0–8.3)

## 2014-11-11 LAB — BASIC METABOLIC PANEL
BUN: 23 mg/dL (ref 6–23)
CO2: 26 mEq/L (ref 19–32)
Calcium: 9.7 mg/dL (ref 8.4–10.5)
Chloride: 101 mEq/L (ref 96–112)
Creatinine, Ser: 0.67 mg/dL (ref 0.40–1.20)
GFR: 99.48 mL/min (ref >60.00)
GLUCOSE: 83 mg/dL (ref 70–99)
POTASSIUM: 3.5 meq/L (ref 3.5–5.1)
Sodium: 138 mEq/L (ref 135–145)

## 2014-11-11 LAB — LIPID PANEL
Cholesterol: 205 mg/dL — ABNORMAL HIGH (ref 0–200)
HDL: 70 mg/dL (ref >39.00)
LDL Cholesterol: 124 mg/dL — ABNORMAL HIGH (ref 0–99)
NONHDL: 135
Total CHOL/HDL Ratio: 3
Triglycerides: 55 mg/dL (ref 0.0–149.0)
VLDL: 11 mg/dL (ref 0.0–40.0)

## 2014-11-11 LAB — C-REACTIVE PROTEIN: CRP: <0.1 mg/dL — ABNORMAL LOW (ref 0.5–20.0)

## 2014-11-11 LAB — SEDIMENTATION RATE: SED RATE: 7 mm/h (ref 0–22)

## 2014-11-11 LAB — TSH: TSH: 0.78 u[IU]/mL (ref 0.35–4.50)

## 2014-11-11 LAB — HEMOGLOBIN A1C: Hgb A1c MFr Bld: 5.5 % (ref 4.6–6.5)

## 2014-11-11 NOTE — Patient Instructions (Signed)
Will notify you  of labs when available. Will get gi consult and poss abd ultrasound.for abd pain and  fam hx  Of colon cancer  Also labs and get rheumatology  About the various  joint pains and stiffness.   Otherwise Continue lifestyle intervention healthy eating and exercise .  Healthy lifestyle includes : At least 150 minutes of exercise weeks  , weight at healthy levels, which is usually   BMI 19-25. Avoid trans fats and processed foods;  Increase fresh fruits and veges to 5 servings per day. And avoid sweet beverages including tea and juice. Mediterranean diet with olive oil and nuts have been noted to be heart and brain healthy . Avoid tobacco products . Limit  alcohol to  7 per week for women and 14 servings for men.  Get adequate sleep . Wear seat belts . Don't text and drive .

## 2014-11-11 NOTE — Progress Notes (Signed)
Pre visit review using our clinic review tool, if applicable. No additional management support is needed unless otherwise documented below in the visit note.  Chief Complaint  Patient presents with  . Annual Exam    ortho advise poss rheum  eval    HPI: Patient  Stephanie Hoffman  49 y.o. comes in today for Preventive Health Care visit  Also  Fu from ortho / if rheum eum disease eval.    Still having hand pains and  Left pain  Had thumb and pinky hand  And then  Dr Nemiah Commander  Ortho.   Suggested .  eval  Has stiffness a lot when not mobile  See note ortho   Taking cherry extract for joints ? If helps   Also new abd sharp pain mid abdomen.   Since last wed off and on for  of years of tenderness  . No change bowel habits  .   Blood etc.  gb out in 2000 always has some tenderness but no pain as the psat week   fam hx  Stents heart   1999 Disease and PVD to have surgery .  71  Daughter 28 . Gm has RA   Health Maintenance  Topic Date Due  . MAMMOGRAM  07/02/2014  . HIV Screening  11/01/2015 (Originally 12/29/1980)  . INFLUENZA VACCINE  03/01/2015  . PAP SMEAR  09/24/2016  . TETANUS/TDAP  09/25/2023   Health Maintenance Review LIFESTYLE:  Exercise:   Walking shop   and dancing.  Tobacco/ETS:no Alcohol: no Sugar beverages: no tea no sodas cut back  Sleep: about taking tylenol pm .   11-6  Drug use: no PAP: utd  2 15  ndg neg hpv.  MAMMO: due    ROS:  No hematuria bleeding  Period due this week.  No vomiting weight loss . ta GEN/ HEENT: No fever, significant weight changes sweats headaches vision problems hearing changes, CV/ PULM; No chest pain shortness of breath cough, syncope,edema  change in exercise tolerance. GI /GU: No adominal pain, vomiting, change in bowel habits. No blood in the stool. No significant GU symptoms. SKIN/HEME: ,no acute skin rashes suspicious lesions or bleeding. No lymphadenopathy, nodules, masses.  NEURO/ PSYCH:  No neurologic signs such as  weakness numbness. No depression anxiety. IMM/ Allergy: No unusual infections.  Allergy .   REST of 12 system review negative except as per HPI   Past Medical History  Diagnosis Date  . Varicose veins   . Headache(784.0)   . Heart murmur     Past Surgical History  Procedure Laterality Date  . Cesarean section    . Cholecystectomy    . Shoulder surgery      right and left  . Knee surgery      left torn ligament 2007    Family History  Problem Relation Age of Onset  . Arthritis    . Diabetes    . Hyperlipidemia    . Hypertension    . Prostate cancer    . Stroke    . Heart disease    . Colon cancer Mother     History   Social History  . Marital Status: Married    Spouse Name: N/A  . Number of Children: N/A  . Years of Education: N/A   Social History Main Topics  . Smoking status: Never Smoker   . Smokeless tobacco: Not on file  . Alcohol Use: No  . Drug Use: No  .  Sexual Activity: Not on file   Other Topics Concern  . 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 BIOTIN 5000 PO Take 10,000 mg by mouth daily.  . diphenhydramine-acetaminophen (TYLENOL PM) 25-500 MG TABS Take 1 tablet by mouth at bedtime as needed.  . gabapentin (NEURONTIN) 300 MG capsule Take 300 mg by mouth 3 (three) times daily.  . meloxicam (MOBIC) 15 MG tablet Take 15 mg by mouth daily.  . NON FORMULARY Cherry Tart: 2 Tablespoons daily  . OVER THE COUNTER MEDICATION Multiple Vitamin Gummy  . [DISCONTINUED] BIOTIN PO Take 10,000 mg by mouth.  . [DISCONTINUED] MAGNESIUM PO Take 250 mg by mouth.   . [DISCONTINUED] Naproxen Sod-Diphenhydramine (ALEVE PM) 220-25 MG TABS Take 1 tablet by mouth at bedtime as needed.  . [DISCONTINUED] naproxen sodium (ANAPROX) 220 MG tablet Take 440 mg by mouth  daily.  . [DISCONTINUED] Omega 3 1000 MG CAPS Take by mouth.    EXAM:  BP 112/66 mmHg  Temp(Src) 98.4 F (36.9 C) (Oral)  Ht 5' 1.5" (1.562 m)  Wt 108 lb 4.8 oz (49.125 kg)  BMI 20.13 kg/m2  Body mass index is 20.13 kg/(m^2).  Physical Exam: Vital signs reviewed [id number] is a well-developed well-nourished alert cooperative    who appearsr stated age in no acute distress.  HEENT: normocephalic atraumatic , Eyes: PERRL EOM's full, conjunctiva clear, Nares: paten,t no deformity discharge or tenderness., Ears: no deformity EAC's clear TMs with normal landmarks. Mouth: clear OP, no lesions, edema.  Moist mucous membranes. Dentition in adequate repair. NECK: supple without masses, thyromegaly or bruits. CHEST/PULM:  Clear to auscultation and percussion breath sounds equal no wheeze , rales or rhonchi. No chest wall deformities or tenderness. Breast: normal by inspection . No dimpling, discharge, masses, tenderness or discharge . Lumpy non tender  CV: PMI is nondisplaced, S1 S2 no gallops, murmurs, rubs. Peripheral pulses are full without delay.No JVD .  ABDOMEN: Bowel sounds normal  Tender right upper and periumbilicalr  ?guard ( says not new) noor rebound, no hepato splenomegal no CVA tenderness.  No hernia. No bruits  Extremtities:  No clubbing cyanosis or edema, no acute joint swelling or redness no focal atrophy some creakiness no acute swelling  NEURO:  Oriented x3, cranial nerves 3-12 appear to be intact, no obvious focal weakness,gait within normal limits no abnormal reflexes or asymmetrical SKIN: No acute rashes normal turgor, color, no bruising or petechiae. PSYCH: Oriented, good eye contact, no obvious depression anxiety, cognition and judgment appear normal. LN: no cervical axillary inguinal adenopathy  See note dr Grandville Silos   ASSESSMENT AND PLAN:  Discussed the following assessment and plan:  Visit for preventive health examination - utd pap immuniz - Plan: Basic metabolic  panel, CBC with Differential/Platelet, Hemoglobin A1c, Hepatic function panel, Lipid panel, TSH, Sedimentation rate, POCT urinalysis dipstick, Cyclic citrul peptide antibody, IgG, C-reactive protein, ANA  Hyperglycemia - Plan: Basic metabolic panel, CBC with Differential/Platelet, Hemoglobin A1c, Hepatic function panel, Lipid panel, TSH, Sedimentation rate, POCT urinalysis dipstick, Cyclic citrul peptide antibody, IgG, C-reactive protein, ANA  Hyperlipidemia - Plan: Basic metabolic panel, CBC with Differential/Platelet, Hemoglobin A1c, Hepatic function panel, Lipid panel, TSH, Sedimentation rate, POCT urinalysis dipstick, Cyclic citrul peptide antibody, IgG, C-reactive  protein, ANA  Multiple joint pain - hand shoulder  - Plan: Basic metabolic panel, CBC with Differential/Platelet, Hemoglobin A1c, Hepatic function panel, Lipid panel, TSH, Sedimentation rate, POCT urinalysis dipstick, Cyclic citrul peptide antibody, IgG, C-reactive protein, ANA  Joint stiffness - Plan: Basic metabolic panel, CBC with Differential/Platelet, Hemoglobin A1c, Hepatic function panel, Lipid panel, TSH, Sedimentation rate, POCT urinalysis dipstick, Cyclic citrul peptide antibody, IgG, C-reactive protein, ANA  Right upper quadrant pain - Plan: Basic metabolic panel, CBC with Differential/Platelet, Hemoglobin A1c, Hepatic function panel, Lipid panel, TSH, Sedimentation rate, POCT urinalysis dipstick, Cyclic citrul peptide antibody, IgG, C-reactive protein, ANA, US Abdomen Complete, Ambulatory referral to Gastroenterology  Colicky periumbilical abdominal pain - Plan: Basic metabolic panel, CBC with Differential/Platelet, Hemoglobin A1c, Hepatic function panel, Lipid panel, TSH, Sedimentation rate, POCT urinalysis dipstick, Cyclic citrul peptide antibody, IgG, C-reactive protein, ANA  Family history of colon cancer in mother - Plan: Ambulatory referral to Gastroenterology  Family history of peripheral vascular disease Has had  various joint issues  Not alarming in themselves but manuy areas involved .  oirhto suggest rheum inflammaotry eval. Labs today and  Refer.  No visual findings today  Gi  Mom had colon cancer 60     Expectant management. For progression or sx  Of importance  Gi referral.  Patient Care Team: Burnis Medin, MD as PCP - General Elsie Saas, MD (Orthopedic Surgery) Milly Jakob, MD as Consulting Physician (Orthopedic Surgery) Patient Instructions  Will notify you  of labs when available. Will get gi consult and poss abd ultrasound.for abd pain and  fam hx  Of colon cancer  Also labs and get rheumatology  About the various  joint pains and stiffness.   Otherwise Continue lifestyle intervention healthy eating and exercise .  Healthy lifestyle includes : At least 150 minutes of exercise weeks  , weight at healthy levels, which is usually   BMI 19-25. Avoid trans fats and processed foods;  Increase fresh fruits and veges to 5 servings per day. And avoid sweet beverages including tea and juice. Mediterranean diet with olive oil and nuts have been noted to be heart and brain healthy . Avoid tobacco products . Limit  alcohol to  7 per week for women and 14 servings for men.  Get adequate sleep . Wear seat belts . Don't text and drive .        Standley Brooking. Panosh M.D.

## 2014-11-12 LAB — ANA: ANA: NEGATIVE

## 2014-11-13 LAB — CYCLIC CITRUL PEPTIDE ANTIBODY, IGG: Cyclic Citrullin Peptide Ab: <2 U/mL (ref 0.0–5.0)

## 2014-11-14 LAB — HM MAMMOGRAPHY

## 2014-11-27 ENCOUNTER — Encounter: Payer: Self-pay | Admitting: Family Medicine

## 2014-12-01 ENCOUNTER — Ambulatory Visit: Payer: PRIVATE HEALTH INSURANCE | Admitting: Nurse Practitioner

## 2014-12-02 ENCOUNTER — Encounter: Payer: Self-pay | Admitting: Nurse Practitioner

## 2014-12-02 ENCOUNTER — Ambulatory Visit (INDEPENDENT_AMBULATORY_CARE_PROVIDER_SITE_OTHER): Payer: PRIVATE HEALTH INSURANCE | Admitting: Nurse Practitioner

## 2014-12-02 VITALS — BP 100/50 | HR 70 | Ht 61.0 in | Wt 107.0 lb

## 2014-12-02 DIAGNOSIS — R194 Change in bowel habit: Secondary | ICD-10-CM | POA: Insufficient documentation

## 2014-12-02 DIAGNOSIS — Z800 Family history of malignant neoplasm of digestive organs: Secondary | ICD-10-CM

## 2014-12-02 DIAGNOSIS — R1011 Right upper quadrant pain: Secondary | ICD-10-CM | POA: Diagnosis not present

## 2014-12-02 DIAGNOSIS — R634 Abnormal weight loss: Secondary | ICD-10-CM

## 2014-12-02 NOTE — Progress Notes (Signed)
HPI :   Patient is a 49 year old female referred by PCP for evaluation of abdominal pain and family history of colon cancer in mother. In late March patient made some dietary changes to try eat healthier. She incorporated flaxseed and cherry tart into her diet. She avoided fatty foods. The beginning of April patient began having intermittent non-radiating RUQ pain. No nausea or vomiting but appetite has diminished.She eventually stopped [employer] and cut back on raw carrots. Her pain significantly improved, last episode was 3 days ago.  She is scheduled for right upper quadrant ultrasound . Patient reports a recent 10 pound weight loss. She has taken meloxicam off and on for years averaging 2-3 doses a week. Patient is status post remote cholecystectomy. Labs 11/11/14 unremarkable including CBC, CMET, CRP, and thyroid.  Tzirel also reports chronic postprandial loose stools since cholecystectomy. Lately she has been having loose stools, even in the fasting state. She has associated mid to lower abdominal discomfort. No recent med changes. She has mad some recent dietary changes which could be contributing. No blood in her stool. No recent antibiotics.    Past Medical History  Diagnosis Date  . Varicose veins   . Headache(784.0)   . Heart murmur   . Gallstones     removed 2000  . Hyperlipidemia   . Pneumonia     Family History  Problem Relation Age of Onset  . Arthritis Mother   . Diabetes Mother   . Hyperlipidemia Mother   . Hypertension Father   . Prostate cancer Maternal Grandfather 80  . Stroke Paternal Grandfather   . Heart disease Mother   . Colon cancer Mother 93  . Stomach cancer Maternal Grandmother 79   History  Substance Use Topics  . Smoking status: Never Smoker   . Smokeless tobacco: Not on file  . Alcohol Use: No   Current Outpatient Prescriptions  Medication Sig Dispense Refill  . BIOTIN 5000 PO Take 10,000 mg by mouth daily.    .  diphenhydramine-acetaminophen (TYLENOL PM) 25-500 MG TABS Take 1 tablet by mouth at bedtime as needed.    . gabapentin (NEURONTIN) 300 MG capsule Take 300 mg by mouth 3 (three) times daily.  0  . meloxicam (MOBIC) 15 MG tablet Take 15 mg by mouth daily.    . NON FORMULARY Cherry Tart: 2 Tablespoons daily    . OVER THE COUNTER MEDICATION Multiple Vitamin Gummy     No current facility-administered medications for this visit.   Allergies  Allergen Reactions  . Codeine Itching  . Penicillins Rash   Review of Systems: Positive for sinus trouble, back pain, menstrual pain, muscle cramps, night sweats . All other systems reviewed and negative except where noted in HPI.   Physical Exam: BP 100/50 mmHg  Pulse 70  Ht 5\' 1"  (1.549 m)  Wt 107 lb (48.535 kg)  BMI 20.23 kg/m2 Constitutional: Pleasant,well-developed, black female in no acute distress. HEENT: Normocephalic and atraumatic. Conjunctivae are normal. No scleral icterus. Neck supple.  Cardiovascular: Normal rate, regular rhythm.  Pulmonary/chest: Effort normal and breath sounds normal. No wheezing, rales or rhonchi. Abdominal: Soft, nondistended, nontender. Bowel sounds active throughout. There are no masses palpable. No hepatomegaly. Extremities: no edema Lymphadenopathy: No cervical adenopathy noted. Neurological: Alert and oriented to person place and time. Skin: Skin is warm and dry. No rashes noted. Psychiatric: Normal mood and affect. Behavior is normal.   ASSESSMENT AND PLAN:  52. 49 year old female with three-week history of intermittent, nonradiating right  upper quadrant pain. Pain followed some recent dietary changes and seems to be improving after elimination of cherry Tart and carrots from diet. Patient is status post remote cholecystectomy. Right upper quadrant ultrasound ordered by PCP is pending. Patient reports intermittent use of meloxicam over the last several years.  In light of recent pain, diminished appetite with  weight loss, and long-term NSAID use patient will be scheduled for upper endoscopy to rule out peptic ulcer disease.. The benefits, risks, and potential complications of EGD with possible biopsies  were discussed with the patient and she agrees to proceed.  2. Family history of colon cancer in mother (in her 73) disease. Patient never had a colonoscopy. Patient will get a for screening colonoscopy. The risks, benefits, and alternatives to colonoscopy with possible biopsy and possible polypectomy were discussed with the patient and she consents to proceed.   3. Bowel changes. Chronic postprandial loose stool since cholecystectomy greater than 10 years ago. Lately having loose stool and mid abdominal pain independent of meals.  Will check C. difficile to be on the safe side. Further evaluation at time of colonoscopy.   Cc: Shanon Ace, MD

## 2014-12-02 NOTE — Patient Instructions (Signed)
You have been scheduled for an endoscopy and colonoscopy. Please follow the written instructions given to you at your visit today. Please pick up your prep supplies at the pharmacy within the next 1-3 days. If you use inhalers (even only as needed), please bring them with you on the day of your procedure.  Your physician has requested that you go to the basement for the following lab work before leaving today: Stool c-diff

## 2014-12-03 ENCOUNTER — Other Ambulatory Visit: Payer: Self-pay | Admitting: Internal Medicine

## 2014-12-03 ENCOUNTER — Ambulatory Visit
Admission: RE | Admit: 2014-12-03 | Discharge: 2014-12-03 | Disposition: A | Discharge status: Home / Self Care | Payer: PRIVATE HEALTH INSURANCE | Source: Ambulatory Visit | Attending: Internal Medicine | Admitting: Internal Medicine

## 2014-12-03 ENCOUNTER — Encounter: Payer: Self-pay | Admitting: Nurse Practitioner

## 2014-12-03 DIAGNOSIS — R1011 Right upper quadrant pain: Secondary | ICD-10-CM

## 2014-12-04 ENCOUNTER — Encounter: Payer: Self-pay | Admitting: Internal Medicine

## 2014-12-07 NOTE — Progress Notes (Signed)
Agree with Ms. Guenther's assessment and plan. Carl E. Gessner, MD, FACG   

## 2014-12-15 ENCOUNTER — Encounter: Payer: Self-pay | Admitting: Internal Medicine

## 2014-12-16 ENCOUNTER — Telehealth: Payer: Self-pay | Admitting: Nurse Practitioner

## 2014-12-16 MED ORDER — POLYETHYLENE GLYCOL 3350 17 GM/SCOOP PO POWD: 1.0000 | Freq: Every day | ORAL | Status: DC

## 2014-12-16 NOTE — Telephone Encounter (Signed)
Called patient back to inform her I sent the prescription today to [facility] for the Generic miralax for the prep.

## 2015-01-14 ENCOUNTER — Emergency Department (HOSPITAL_COMMUNITY)
Admission: EM | Admit: 2015-01-14 | Discharge: 2015-01-14 | Disposition: A | Discharge status: Home / Self Care | Payer: No Typology Code available for payment source | Attending: Emergency Medicine | Admitting: Emergency Medicine

## 2015-01-14 ENCOUNTER — Telehealth: Payer: Self-pay | Admitting: Family Medicine

## 2015-01-14 ENCOUNTER — Encounter (HOSPITAL_COMMUNITY): Payer: Self-pay

## 2015-01-14 ENCOUNTER — Emergency Department (HOSPITAL_COMMUNITY): Payer: No Typology Code available for payment source

## 2015-01-14 DIAGNOSIS — Z8639 Personal history of other endocrine, nutritional and metabolic disease: Secondary | ICD-10-CM | POA: Insufficient documentation

## 2015-01-14 DIAGNOSIS — R011 Cardiac murmur, unspecified: Secondary | ICD-10-CM | POA: Diagnosis not present

## 2015-01-14 DIAGNOSIS — Z8701 Personal history of pneumonia (recurrent): Secondary | ICD-10-CM | POA: Diagnosis not present

## 2015-01-14 DIAGNOSIS — R05 Cough: Secondary | ICD-10-CM | POA: Insufficient documentation

## 2015-01-14 DIAGNOSIS — Z8719 Personal history of other diseases of the digestive system: Secondary | ICD-10-CM | POA: Insufficient documentation

## 2015-01-14 DIAGNOSIS — R059 Cough, unspecified: Secondary | ICD-10-CM

## 2015-01-14 DIAGNOSIS — Z8709 Personal history of other diseases of the respiratory system: Secondary | ICD-10-CM | POA: Insufficient documentation

## 2015-01-14 LAB — CBC
HEMATOCRIT: 35.2 % — AB (ref 36.0–46.0)
HEMOGLOBIN: 11.7 g/dL — AB (ref 12.0–15.0)
MCH: 28.2 pg (ref 26.0–34.0)
MCHC: 33.2 g/dL (ref 30.0–36.0)
MCV: 84.8 fL (ref 78.0–100.0)
Platelets: 271 10*3/uL (ref 150–400)
RBC: 4.15 MIL/uL (ref 3.87–5.11)
RDW: 12 % (ref 11.5–15.5)
WBC: 7.3 10*3/uL (ref 4.0–10.5)

## 2015-01-14 LAB — BASIC METABOLIC PANEL
Anion gap: 11 (ref 5–15)
BUN: 24 mg/dL — ABNORMAL HIGH (ref 6–20)
CALCIUM: 9.3 mg/dL (ref 8.9–10.3)
CHLORIDE: 109 mmol/L (ref 101–111)
CO2: 20 mmol/L — ABNORMAL LOW (ref 22–32)
Creatinine, Ser: 0.62 mg/dL (ref 0.44–1.00)
GFR calc Af Amer: >60 mL/min (ref >60)
Glucose, Bld: 99 mg/dL (ref 65–99)
POTASSIUM: 2.9 mmol/L — AB (ref 3.5–5.1)
Sodium: 140 mmol/L (ref 135–145)

## 2015-01-14 MED ORDER — POTASSIUM CHLORIDE 20 MEQ/15ML (10%) PO SOLN
60.0000 meq | Freq: Every day | ORAL | Status: DC
  Administered 2015-01-14: 60 meq via ORAL
  Filled 2015-01-14: qty 45

## 2015-01-14 MED ORDER — METOCLOPRAMIDE HCL 10 MG PO TABS
10.0000 mg | ORAL_TABLET | Freq: Once | ORAL | Status: AC
  Administered 2015-01-14: 10 mg via ORAL
  Filled 2015-01-14: qty 1

## 2015-01-14 MED ORDER — POTASSIUM CHLORIDE CRYS ER 20 MEQ PO TBCR
60.0000 meq | EXTENDED_RELEASE_TABLET | Freq: Once | ORAL | Status: DC
  Filled 2015-01-14: qty 3

## 2015-01-14 MED ORDER — IPRATROPIUM-ALBUTEROL 0.5-2.5 (3) MG/3ML IN SOLN
3.0000 mL | RESPIRATORY_TRACT | Status: DC
  Administered 2015-01-14: 3 mL via RESPIRATORY_TRACT
  Filled 2015-01-14: qty 3

## 2015-01-14 NOTE — Telephone Encounter (Signed)
On reviewing ed visits    Pt was seen in ed for cough no pna or compromise  Says to be seen in 3 days although uncertain why . Please contact her in 3 days and see how doing      Anyway unless some new problem feer sob etc can be seen next week for ed follow up if needed.      Thanks   wp  ----- Message -----   From: SYSTEM   Sent: 01/14/2015  6:16 AM    To: Burnis Medin, MD

## 2015-01-14 NOTE — ED Provider Notes (Signed)
CSN: [id number]     Arrival date & time 01/14/15  7616 History   First MD Initiated Contact with Patient 01/14/15 0501     Chief Complaint  Patient presents with  . Cough     (Consider location/radiation/quality/duration/timing/severity/associated sxs/prior Treatment) HPI  Stephanie Hoffman is a 49 yo female with PMH of HA, PNA, presenting today with persistent cough.  She was diagnosed with bronchitis and early pneuomonia and completed a course of azithromycin.  That did not work so she took a course of doxycycline as well.  She was placed on albuterol puffs and tessolon pearles for her cough.  She states this has not worked.  She denies any productivity to her cough.  She has had no fevers.  The cough makes her chest hurt and gives her a headache.  She presents for help with her cough.  She has no further complaints.  10 Systems reviewed and are negative for acute change except as noted in the HPI.   Past Medical History  Diagnosis Date  . Varicose veins   . Headache(784.0)   . Heart murmur   . Gallstones     removed 2000  . Hyperlipidemia   . Pneumonia    Past Surgical History  Procedure Laterality Date  . Cesarean section    . Cholecystectomy      2000  . Shoulder surgery      right and left  . Knee surgery      left torn ligament 2007   Family History  Problem Relation Age of Onset  . Arthritis Mother   . Diabetes Mother   . Hyperlipidemia Mother   . Hypertension Father   . Prostate cancer Maternal Grandfather 80  . Stroke Paternal Grandfather   . Heart disease Mother   . Colon cancer Mother 55  . Stomach cancer Maternal Grandmother 79   History  Substance Use Topics  . Smoking status: Never Smoker   . Smokeless tobacco: Not on file  . Alcohol Use: No   OB History    No data available     Review of Systems    Allergies  Codeine and Penicillins  Home Medications   Prior to Admission medications   Medication Sig Start Date End Date Taking? Authorizing  Provider  benzonatate (TESSALON) 200 MG capsule Take 1 capsule by mouth 3 (three) times daily. 01/05/15  Yes Historical Provider, MD  BIOTIN 5000 PO Take 10,000 mg by mouth daily.   Yes Historical Provider, MD  diphenhydramine-acetaminophen (TYLENOL PM) 25-500 MG TABS Take 1 tablet by mouth at bedtime as needed (sleep).    Yes Historical Provider, MD  doxycycline (VIBRA-TABS) 100 MG tablet Take 1 tablet by mouth 2 (two) times daily. 01/05/15  Yes Historical Provider, MD  gabapentin (NEURONTIN) 300 MG capsule Take 300 mg by mouth 3 (three) times daily. 11/06/14  Yes Historical Provider, MD  NAPROXEN DR 500 MG EC tablet Take 1 tablet by mouth 2 (two) times daily. 12/30/14  Yes Historical Provider, MD  OVER THE COUNTER MEDICATION Multiple Vitamin Gummy   Yes Historical Provider, MD  PROAIR HFA 108 (90 BASE) MCG/ACT inhaler Inhale 2 puffs into the lungs every 6 (six) hours. 12/24/14  Yes Historical Provider, MD  polyethylene glycol powder (GLYCOLAX/MIRALAX) powder Take 255 g by mouth daily. 12/16/14   Amy S Esterwood, PA-C   BP 134/80 mmHg  Pulse 80  Temp(Src) 98.4 F (36.9 C) (Oral)  Resp 18  Ht 5' 1.5" (1.562 m)  Wt 106  lb (48.081 kg)  BMI 19.71 kg/m2  SpO2 100%  LMP 12/27/2014 (Approximate) Physical Exam  Constitutional: She is oriented to person, place, and time. She appears well-developed and well-nourished. No distress.  Frequent mild cough on exam  HENT:  Head: Normocephalic and atraumatic.  Nose: Nose normal.  Mouth/Throat: Oropharynx is clear and moist. No oropharyngeal exudate.  Eyes: Conjunctivae and EOM are normal. Pupils are equal, round, and reactive to light. No scleral icterus.  Neck: Normal range of motion. Neck supple. No JVD present. No tracheal deviation present. No thyromegaly present.  Cardiovascular: Normal rate, regular rhythm and normal heart sounds.  Exam reveals no gallop and no friction rub.   No murmur heard. Pulmonary/Chest: Effort normal and breath sounds normal. No  respiratory distress. She has no wheezes. She exhibits no tenderness.  Abdominal: Soft. Bowel sounds are normal. She exhibits no distension and no mass. There is no tenderness. There is no rebound and no guarding.  Musculoskeletal: Normal range of motion. She exhibits no edema or tenderness.  Lymphadenopathy:    She has no cervical adenopathy.  Neurological: She is alert and oriented to person, place, and time. No cranial nerve deficit. She exhibits normal muscle tone.  Skin: Skin is warm and dry. No rash noted. She is not diaphoretic. No erythema. No pallor.  Nursing note and vitals reviewed.   ED Course  Procedures (including critical care time) Labs Review Labs Reviewed  CBC - Abnormal; Notable for the following:    Hemoglobin 11.7 (*)    HCT 35.2 (*)    All other components within normal limits  BASIC METABOLIC PANEL - Abnormal; Notable for the following:    Potassium 2.9 (*)    CO2 20 (*)    BUN 24 (*)    All other components within normal limits    Imaging Review Dg Chest 2 View  01/14/2015   CLINICAL DATA:  Cough for 3 weeks. Seen at urgent care twice for same. Diagnosed with bronchitis and pneumonia. Finishing antibiotics today. Chest tightness and headache worse with cough.  EXAM: CHEST  2 VIEW  COMPARISON:  None.  FINDINGS: The heart size and mediastinal contours are within normal limits. Both lungs are clear. The visualized skeletal structures are unremarkable.  IMPRESSION: No active cardiopulmonary disease.   Electronically Signed   By: Lucienne Capers M.D.   On: 01/14/2015 04:14     EKG Interpretation   Date/Time:  Thursday January 14 2015 03:32:56 EDT Ventricular Rate:  76 PR Interval:  136 QRS Duration: 78 QT Interval:  352 QTC Calculation: 396 R Axis:   79 Text Interpretation:  Sinus rhythm Confirmed by Glynn Octave  [phone]) on 01/14/2015 4:29:00 AM      MDM   Final diagnoses:  None    Patient presents to the ED for  Persistent cough.  CXR  shows no pneuomina.  She was educated that coughs can last several months.  She was given duoneb in the ED for symptomatic treatment. Also reglan for headache.  She has inhaler at home and advised to use it every 4-6 hours for 2 days.  PCP fu within 3 days.  She appears well and in NAD.  Her VS remain within her normal limits and she is safe for DC   Everlene Balls, MD 01/14/15 0600

## 2015-01-14 NOTE — ED Notes (Signed)
Pt ambulating independently w/ steady gait on d/c in no acute distress, A&Ox4. D/c instructions reviewed w/ pt - pt denies any further questions or concerns at present.  

## 2015-01-14 NOTE — Discharge Instructions (Signed)
Cough, Adult Ms. Barley, use your albuterol inhaler every 4-6hours for 2 days to help with your cough.  This cough may continue for 2-30months.  See your primary doctor within 3 days for close follow up.  If symptoms worsen, come back to the ED immediately. Thank you.  A cough is a reflex. It helps you clear your throat and airways. A cough can help heal your body. A cough can last 2 or 3 weeks (acute) or may last more than 8 weeks (chronic). Some common causes of a cough can include an infection, allergy, or a cold. HOME CARE  Only take medicine as told by your doctor.  If given, take your medicines (antibiotics) as told. Finish them even if you start to feel better.  Use a cold steam vaporizer or humidifier in your home. This can help loosen thick spit (secretions).  Sleep so you are almost sitting up (semi-upright). Use pillows to do this. This helps reduce coughing.  Rest as needed.  Stop smoking if you smoke. GET HELP RIGHT AWAY IF:  You have yellowish-white fluid (pus) in your thick spit.  Your cough gets worse.  Your medicine does not reduce coughing, and you are losing sleep.  You cough up blood.  You have trouble breathing.  Your pain gets worse and medicine does not help.  You have a fever. MAKE SURE YOU:   Understand these instructions.  Will watch your condition.  Will get help right away if you are not doing well or get worse. Document Released: 03/30/2011 Document Revised: 12/01/2013 Document Reviewed: 03/30/2011 [facility] Patient Information 2015 [place], [place]. This information is not intended to replace advice given to you by your health care provider. Make sure you discuss any questions you have with your health care provider.

## 2015-01-14 NOTE — ED Notes (Addendum)
Patient reports she has had a cough for three weeks, occasionally productive.  She's been seen at urgent care twice for same.  Diagnosed with bronchitis and pneumonia.  Scheduled to finish antibiotics today.  Reports chest tightness and headache that are both worse with cough.

## 2015-01-15 NOTE — Telephone Encounter (Signed)
Spoke to the pt.  She stated she is feeling much better.  Cough is better.  Still having some yellow production but still better.  Denied fever or other sx.  Sent to scheduling to be seen next week.

## 2015-01-22 ENCOUNTER — Encounter: Payer: Self-pay | Admitting: Internal Medicine

## 2015-01-22 ENCOUNTER — Ambulatory Visit (INDEPENDENT_AMBULATORY_CARE_PROVIDER_SITE_OTHER): Payer: PRIVATE HEALTH INSURANCE | Admitting: Internal Medicine

## 2015-01-22 VITALS — BP 102/62 | HR 60 | Temp 98.5°F | Wt 106.4 lb

## 2015-01-22 DIAGNOSIS — Z79899 Other long term (current) drug therapy: Secondary | ICD-10-CM | POA: Diagnosis not present

## 2015-01-22 DIAGNOSIS — R05 Cough: Secondary | ICD-10-CM

## 2015-01-22 DIAGNOSIS — R053 Chronic cough: Secondary | ICD-10-CM

## 2015-01-22 DIAGNOSIS — M2550 Pain in unspecified joint: Secondary | ICD-10-CM | POA: Diagnosis not present

## 2015-01-22 DIAGNOSIS — M792 Neuralgia and neuritis, unspecified: Secondary | ICD-10-CM | POA: Diagnosis not present

## 2015-01-22 MED ORDER — GABAPENTIN 300 MG PO CAPS: 300.0000 mg | ORAL_CAPSULE | Freq: Three times a day (TID) | ORAL | Status: DC

## 2015-01-22 NOTE — Patient Instructions (Signed)
Ok to refill the neurontin  and take how you are doing . Keep fu with dr Amil Amen as  Advised .  Cough should continue to improve  You have irritable  Airway   at this time .    Marland Kitchenok to go another week 1/2 days at work for voice rest .  ROV  In 6 months for med check

## 2015-01-22 NOTE — Progress Notes (Signed)
Pre visit review using our clinic review tool, if applicable. No additional management support is needed unless otherwise documented below in the visit note.  Chief Complaint  Patient presents with  . ED Follow Up    rx neurontin also     HPI:  Patient come in for follow up from ED visit 6 16 for cough .  After rx with x pack and doxy she presented to ed for cough issues. ekg was normal  c xray normal  nad  Reg cough not at night  Sleep mucindex dm .  Avoiding  Physical  acitivy to avoid triggering  Working only 1/2 day . In office.  No fever .   Better .   Talks on phone     And a problem  Would like to limit  Another week  1/2 day.   Ed gave note for 3 days until .   Could come back in. She has our pinch back to work for half days and seems to be doing a whole lot better she has an occasional cough and throat irritation but no shortness of breath. See below  Patient presents to the ED for Persistent cough. CXR shows no pneuomina. She was educated that coughs can last several months. She was given duoneb in the ED for symptomatic treatment. Also reglan for headache. She has inhaler at home and advised to use it every 4-6 hours for 2 days. PCP fu within 3 days. She appears well and in NAD. Her VS remain within her normal limits and she is safe for DC  Has seen rheumatologist Dr. Amil Amen who states it's probably not inflammatory arthritis and pain but to follow up in about 6 months to follow.  Dr. Grandville Silos at [facility] had evaluated her arm removed the cyst and gave her Neurontin 300 mg 3 times a day as needed for the pain. He called it radicular pain of unknown etiology. The Neurontin has been significantly helpful she takes it every night helps her sleep and the pain and occasionally takes an extra 1. Declines taking it in the day because it makes her too drowsy at work. Would like [place] to refill her take over the prescription.  ROS: See pertinent positives and negatives  per HPI. No current chest pain shortness of breath new symptoms.  Past Medical History  Diagnosis Date  . Varicose veins   . Headache(784.0)   . Heart murmur   . Gallstones     removed 2000  . Hyperlipidemia   . Pneumonia     Family History  Problem Relation Age of Onset  . Arthritis Mother   . Diabetes Mother   . Hyperlipidemia Mother   . Hypertension Father   . Prostate cancer Maternal Grandfather 80  . Stroke Paternal Grandfather   . Heart disease Mother   . Colon cancer Mother 39  . Stomach cancer Maternal Grandmother 79    History   Social History  . Marital Status: Married    Spouse Name: N/A  . Number of Children: N/A  . Years of Education: N/A   Social History Main Topics  . Smoking status: Never Smoker   . Smokeless tobacco: Not on file  . Alcohol Use: No  . Drug Use: No  . Sexual Activity: Not on file   Other Topics Concern  . 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 BIOTIN 5000 PO Take 10,000 mg by mouth daily.    . diphenhydramine-acetaminophen (TYLENOL PM) 25-500 MG TABS Take 1 tablet by mouth at bedtime as needed (sleep).     Marland Kitchen NAPROXEN DR 500 MG EC tablet Take 1 tablet by mouth 2 (two) times daily.    Marland Kitchen OVER THE COUNTER MEDICATION Multiple Vitamin Gummy    . PROAIR HFA 108 (90 BASE) MCG/ACT inhaler Inhale 2 puffs into the lungs every 6 (six) hours.  0  . gabapentin (NEURONTIN) 300 MG capsule Take 300 mg by mouth 3 (three) times daily.  0  . polyethylene glycol powder (GLYCOLAX/MIRALAX) powder Take 255 g by mouth daily. (Patient not taking: Reported on 01/22/2015) 255 g 0  . benzonatate (TESSALON) 200 MG capsule Take 1 capsule by mouth 3 (three) times daily.  0  . doxycycline (VIBRA-TABS) 100 MG tablet Take 1 tablet by mouth 2 (two) times  daily.  0   No facility-administered medications prior to visit.     EXAM:  BP 102/62 mmHg  Pulse 60  Temp(Src) 98.5 F (36.9 C) (Oral)  Wt 106 lb 6.4 oz (48.263 kg)  SpO2 98%  LMP 12/27/2014 (Approximate)  Body mass index is 19.78 kg/(m^2).  GENERAL: vitals reviewed and listed above, alert, oriented, appears well hydrated and in no acute distress looks slightly tired but quiet respirations and no cough during the exam HEENT: atraumatic, conjunctiva  clear, no obvious abnormalities on inspection of external nose and ears OP : no lesion edema or exudate  NECK: no obvious masses on inspection palpation  LUNGS: clear to auscultation bilaterally, no wheezes, rales or rhonchi, good air movement CV: HRRR, no clubbing cyanosis or  peripheral edema nl cap refill  MS: moves all extremities without noticeable focal  abnormality right upper extremity well-healed scar normal range of motion. PSYCH: pleasant and cooperative, no obvious depression or anxiety  ASSESSMENT AND PLAN:  Discussed the following assessment and plan:  No diagnosis found. Pt wit persistent cough treated but 2 other entities   Including ed assessment which was reassuing  She prob has  Irritable airway issues  And prolonge cough with this . But on the road to recovery.     Viewed record Neurontin helpful for her pain which sounds neuropathic discussed medicine risk-benefit can take 2 at a time if needed but with caution follow-up in 6 months med check unless she is doing well and can email [place] in. She'll follow-up with Dr. Amil Amen as recommended for monitoring. Healthcare maintenance otherwise as appropriate.  -Patient advised to return or notify health care team  if symptoms worsen ,persist or new concerns arise.  Patient Instructions  Ok to refill the neurontin  and take how you are doing . Keep fu with dr Amil Amen as  Advised .  Cough should continue to improve  You have irritable  Airway   at this time .    Marland Kitchenok to  go another week 1/2 days at work for voice rest .  ROV  In 6 months for med check     Mariann Laster K. Fatimata Talsma M.D.

## 2015-01-27 ENCOUNTER — Encounter: Payer: Self-pay | Admitting: Internal Medicine

## 2015-01-27 ENCOUNTER — Ambulatory Visit (AMBULATORY_SURGERY_CENTER): Payer: PRIVATE HEALTH INSURANCE | Admitting: Internal Medicine

## 2015-01-27 VITALS — BP 125/75 | HR 74 | Temp 98.1°F | Resp 16 | Ht 61.0 in | Wt 107.0 lb

## 2015-01-27 DIAGNOSIS — Z1211 Encounter for screening for malignant neoplasm of colon: Secondary | ICD-10-CM

## 2015-01-27 DIAGNOSIS — K2990 Gastroduodenitis, unspecified, without bleeding: Secondary | ICD-10-CM | POA: Diagnosis not present

## 2015-01-27 DIAGNOSIS — K2970 Gastritis, unspecified, without bleeding: Secondary | ICD-10-CM

## 2015-01-27 DIAGNOSIS — Z800 Family history of malignant neoplasm of digestive organs: Secondary | ICD-10-CM

## 2015-01-27 DIAGNOSIS — R194 Change in bowel habit: Secondary | ICD-10-CM

## 2015-01-27 DIAGNOSIS — R1011 Right upper quadrant pain: Secondary | ICD-10-CM

## 2015-01-27 HISTORY — PX: COLONOSCOPY: SHX174

## 2015-01-27 MED ORDER — SODIUM CHLORIDE 0.9 % IV SOLN: 500.0000 mL | INTRAVENOUS | Status: DC

## 2015-01-27 NOTE — Progress Notes (Signed)
Called to room to assist during endoscopic procedure.  Patient ID and intended procedure confirmed with present staff. Received instructions for my participation in the procedure from the performing physician.  

## 2015-01-27 NOTE — Op Note (Signed)
[place]  [address]. [place], [address]   COLONOSCOPY PROCEDURE REPORT  PATIENT: Stephanie Hoffman, Stephanie Hoffman  MR#: [id number] BIRTHDATE: [birth year] , 49  yrs. old GENDER: female ENDOSCOPIST: Gatha Mayer, MD, [facility] PROCEDURE DATE:  01/27/2015 PROCEDURE:   Colonoscopy, screening First Screening Colonoscopy - Avg.  risk and is 50 yrs.  old or older Yes.  Prior Negative Screening - Now for repeat screening. N/A  History of Adenoma - Now for follow-up colonoscopy & has been > or = to 3 yrs.  N/A  Polyps removed today? No Recommend repeat exam, <10 yrs? Yes high risk ASA CLASS:   Class II INDICATIONS:Screening for colonic neoplasia and FH Colon or Rectal Adenocarcinoma. MEDICATIONS: Residual sedation present, Propofol 250 mg IV, and Monitored anesthesia care  DESCRIPTION OF PROCEDURE:   After the risks benefits and alternatives of the procedure were thoroughly explained, informed consent was obtained.  The digital rectal exam revealed no abnormalities of the rectum.   The [id number]  endoscope was introduced through the anus and advanced to the cecum, which was identified by both the appendix and ileocecal valve. No adverse events experienced.   The quality of the prep was excellent. (MiraLax was used)  The instrument was then slowly withdrawn as the colon was fully examined. Estimated blood loss is zero unless otherwise noted in this procedure report.      COLON FINDINGS: A normal appearing cecum, ileocecal valve, and appendiceal orifice were identified.  The ascending, transverse, descending, sigmoid colon, and rectum appeared unremarkable. Retroflexed views revealed no abnormalities. The time to cecum = 9.0 Withdrawal time = 9.7   The scope was withdrawn and the procedure completed. COMPLICATIONS: There were no immediate complications.  ENDOSCOPIC IMPRESSION: Normal colonoscopy  RECOMMENDATIONS: Repeat Colonoscopy in 5 years.  FHx CRCA  mom  eSigned:  Gatha Mayer, MD, [facility] 01/27/2015 2:45 PM   cc:  The Patient and Shanon Ace, MD

## 2015-01-27 NOTE — Patient Instructions (Addendum)
There was some inflammation in the stomach - took biopsies. I also took biopsies of the intestine also.  The colon was normal.  Your next routine colonoscopy should be in 5 years - 2021. I will call with results of biopsies.  I appreciate the opportunity to care for you. Gatha Mayer, MD, FACG   YOU HAD AN ENDOSCOPIC PROCEDURE TODAY AT [facility]:   Refer to the procedure report that was given to you for any specific questions about what was found during the examination.  If the procedure report does not answer your questions, please call your gastroenterologist to clarify.  If you requested that your care partner not be given the details of your procedure findings, then the procedure report has been included in a sealed envelope for you to review at your convenience later.  YOU SHOULD EXPECT: Some feelings of bloating in the abdomen. Passage of more gas than usual.  Walking can help get rid of the air that was put into your GI tract during the procedure and reduce the bloating. If you had a lower endoscopy (such as a colonoscopy or flexible sigmoidoscopy) you may notice spotting of blood in your stool or on the toilet paper. If you underwent a bowel prep for your procedure, you may not have a normal bowel movement for a few days.  Please Note:  You might notice some irritation and congestion in your nose or some drainage.  This is from the oxygen used during your procedure.  There is no need for concern and it should clear up in a day or so.  SYMPTOMS TO REPORT IMMEDIATELY:   Following lower endoscopy (colonoscopy or flexible sigmoidoscopy):  Excessive amounts of blood in the stool  Significant tenderness or worsening of abdominal pains  Swelling of the abdomen that is new, acute  Fever of 100F or higher   Following upper endoscopy (EGD)  Vomiting of blood or coffee ground material  New chest pain or pain under the shoulder blades  Painful or  persistently difficult swallowing  New shortness of breath  Fever of 100F or higher  Black, tarry-looking stools  For urgent or emergent issues, a gastroenterologist can be reached at any hour by calling [phone].   DIET: Your first meal following the procedure should be a small meal and then it is ok to progress to your normal diet. Heavy or fried foods are harder to digest and may make you feel nauseous or bloated.  Likewise, meals heavy in dairy and vegetables can increase bloating.  Drink plenty of fluids but you should avoid alcoholic beverages for 24 hours.  ACTIVITY:  You should plan to take it easy for the rest of today and you should NOT DRIVE or use heavy machinery until tomorrow (because of the sedation medicines used during the test).    FOLLOW UP: Our staff will call the number listed on your records the next business day following your procedure to check on you and address any questions or concerns that you may have regarding the information given to you following your procedure. If we do not reach you, we will leave a message.  However, if you are feeling well and you are not experiencing any problems, there is no need to return our call.  We will assume that you have returned to your regular daily activities without incident.  If any biopsies were taken you will be contacted by phone or by letter within the next 1-3 weeks.  Please call us at [phone] if you have not heard about the biopsies in 3 weeks.    SIGNATURES/CONFIDENTIALITY: You and/or your care partner have signed paperwork which will be entered into your electronic medical record.  These signatures attest to the fact that that the information above on your After Visit Summary has been reviewed and is understood.  Full responsibility of the confidentiality of this discharge information lies with you and/or your care-partner.

## 2015-01-27 NOTE — Progress Notes (Signed)
Stable to RR 

## 2015-01-27 NOTE — Op Note (Signed)
[facility]  [address] [place], [id number]   ENDOSCOPY PROCEDURE REPORT  PATIENT: Stephanie Hoffman, Stephanie Hoffman  MR#: [id number] BIRTHDATE: [birth year] , 49  yrs. old GENDER: female ENDOSCOPIST: Gatha Mayer, MD, [facility] PROCEDURE DATE:  01/27/2015 PROCEDURE:  EGD w/ biopsy ASA CLASS:     Class II INDICATIONS:  abdominal pain in the upper right quadrant. MEDICATIONS: Propofol 100 mg IV and Monitored anesthesia care TOPICAL ANESTHETIC: none  DESCRIPTION OF PROCEDURE: After the risks benefits and alternatives of the procedure were thoroughly explained, informed consent was obtained.  The LB [id number] endoscope was introduced through the mouth and advanced to the second portion of the duodenum , Without limitations.  The instrument was slowly withdrawn as the mucosa was fully examined.    1) Antral gastritis - biopsies taken 2) Otherwise normal - duodenal biopsies taken to look for sprue. Retroflexed views revealed no abnormalities.     The scope was then withdrawn from the patient and the procedure completed.  COMPLICATIONS: There were no immediate complications.  ENDOSCOPIC IMPRESSION: 1) Antral gastritis - biopsies taken 2) Otherwise normal - duodenal biopsies taken to look for sprue  RECOMMENDATIONS: 1.  Await pathology results 2.  Office will call with results 3.  Proceed with a Colonoscopy.   eSigned:  Gatha Mayer, MD, [facility] 01/27/2015 2:43 PM    CC: The Patient and Shanon Ace, MD

## 2015-01-27 NOTE — Progress Notes (Addendum)
Dr. Carlean Purl in to see patient again at 7. Patient awake and alert. Patient not talking but pointing to what she wants.Patient drowsy until 1517. Patient at that time wanted to get oob to bathroom to urinate. Dressed patient ambulated with assistance. Husband pointing to an old bruise on left forearm and questioned loudly where did that bruise come from. Patient answering from the window this morning.Patient urinated and then walked to recliner room. Patient prior to leaving restroom co 5/10 gassy feeling in abdomen. After walking to recliner room 3/10. Reinforced discharge instructions with patient and husband. Both verbalizing understanding and wanting to discharge.

## 2015-01-28 ENCOUNTER — Telehealth: Payer: Self-pay | Admitting: Emergency Medicine

## 2015-01-28 NOTE — Telephone Encounter (Signed)
No identifier, left message to f/u 

## 2015-02-02 NOTE — Progress Notes (Signed)
Quick Note:  Biopsies are ok - no gastritis and no celiac disease  Is she still having problems with pain and diarrhea?  [facility] - no letter, no EGD recall ______

## 2015-02-03 NOTE — Progress Notes (Signed)
Quick Note:  Symptoms gone F/u prn then ______

## 2015-06-09 ENCOUNTER — Other Ambulatory Visit: Payer: Self-pay | Admitting: Family Medicine

## 2015-06-09 MED ORDER — GABAPENTIN 300 MG PO CAPS: 300.0000 mg | ORAL_CAPSULE | Freq: Three times a day (TID) | ORAL | Status: DC

## 2015-06-09 NOTE — Telephone Encounter (Signed)
Sent to the pharmacy by e-scribe. 

## 2015-07-06 ENCOUNTER — Telehealth: Payer: Self-pay | Admitting: Internal Medicine

## 2015-07-06 NOTE — Telephone Encounter (Signed)
NO to the mri  Whomever is covering for her back special ist  should decide on next step . Please find out who told her to contact her PCP for an MRI .Marland KitchenMarland KitchenMarland Kitchen

## 2015-07-06 NOTE — Telephone Encounter (Signed)
Spoke to the pt.  Advised that Dr. Regis Bill is unable to order requested MRI.  Shoulder MRI is being ordered by Dr. Archie Endo office.  Advised her to call their office to request further imaging.  She was told by the [facility] to contact her primary care.  She would like to be seen by them as she has been told in the past that she has a bulging disc.

## 2015-07-06 NOTE — Telephone Encounter (Signed)
Pt call to ask for order for a MR on back she is having severe back pain . She said she is having MRI tomorrow on her shoulder and would like for MRI for her back to be combine. Pt said her back doctor is out for the rest of the month and was told to contact her PCP

## 2015-07-13 ENCOUNTER — Telehealth: Payer: Self-pay | Admitting: Internal Medicine

## 2015-07-13 DIAGNOSIS — M549 Dorsalgia, unspecified: Secondary | ICD-10-CM

## 2015-07-13 NOTE — Telephone Encounter (Signed)
Ok to refer for back pain?

## 2015-07-13 NOTE — Telephone Encounter (Signed)
Pt call to say that she need a referral to Pain Management . Dr Greta Doom Stacy

## 2015-07-13 NOTE — Telephone Encounter (Signed)
Ok to do this 

## 2015-07-13 NOTE — Telephone Encounter (Signed)
Referral placed in the system. 

## 2015-09-03 ENCOUNTER — Encounter: Payer: Self-pay | Admitting: Internal Medicine

## 2015-09-03 ENCOUNTER — Ambulatory Visit (INDEPENDENT_AMBULATORY_CARE_PROVIDER_SITE_OTHER): Payer: No Typology Code available for payment source | Admitting: Internal Medicine

## 2015-09-03 VITALS — BP 110/72 | Temp 98.7°F | Wt 107.1 lb

## 2015-09-03 DIAGNOSIS — M5441 Lumbago with sciatica, right side: Secondary | ICD-10-CM | POA: Diagnosis not present

## 2015-09-03 NOTE — Progress Notes (Signed)
Pre visit review using our clinic review tool, if applicable. No additional management support is needed unless otherwise documented below in the visit note.  Chief Complaint  Patient presents with  . Back Pain    Ongoing back pain that is worsening.  Seen specialists.  Does not want to go back to [employer].  Currently in physical therapy for shoulder and knee.    HPI: Patient Stephanie Hoffman  comes in today for SDA for   problem evaluation. She has been under care for other musculoskeletal symptoms most recently then developed over the last few months increasing low back pain now radiation to her right leg with pain and what feels like numbness uncertain if weakness no falling no injury. Saw  Murphy wainer   Pain management   Pt  Slipped disc in 2013 diagnosis without imaging.  Then worse 2016  And summer  Tried   A belt .  No help.    Has been being treated for left knee patellar problem right neck and shoulder problem under integrative therapies not attending back issue because it has not been under care. She was told by someone that PCP would have to order an MRI for further evaluation.   she states her take pain is about a 10 out of 10 although there are times in the day words pretty good in a couple hours a day where she doesn't feel it. No new medication.  She states she cannot sleep on her stomach like she usually does because of the amount of pain and radiation. Denies any falling or acute weakness. Bowel or bladder changes.  pred  No help for neck and knee in the past  She has seen a rheumatologist in the past with a new inflammatory diagnoses. Physical therapy seems to help her neck and shoulder.   ROS: See pertinent positives and negatives per HPI.no current chest pain shortness of breath syncope fevers UTI symptoms   Past Medical History  Diagnosis Date  . Varicose veins   . Headache(784.0)   . Heart murmur   . Gallstones     removed 2000  . Hyperlipidemia   .  Pneumonia     Family History  Problem Relation Age of Onset  . Arthritis Mother   . Diabetes Mother   . Hyperlipidemia Mother   . Heart disease Mother   . Colon cancer Mother 40  . Hypertension Father   . Prostate cancer Maternal Grandfather 80  . Stroke Paternal Grandfather   . Stomach cancer Maternal Grandmother 79  . Esophageal cancer Neg Hx   . Rectal cancer Neg Hx     Social History   Social History  . Marital Status: Married    Spouse Name: N/A  . Number of Children: N/A  . Years of Education: N/A   Social History Main Topics  . Smoking status: Never Smoker   . Smokeless tobacco: Never Used  . Alcohol Use: No  . Drug Use: No  . Sexual Activity: Yes     Comment: husband had vastectomy   Other Topics Concern  . 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 BIOTIN 5000 PO Take 10,000 mg by mouth daily.    Marland Kitchen  diphenhydramine-acetaminophen (TYLENOL PM) 25-500 MG TABS Take 1 tablet by mouth at bedtime as needed (sleep).     . gabapentin (NEURONTIN) 300 MG capsule Take 1 capsule (300 mg total) by mouth 3 (three) times daily. 270 capsule 0  . NAPROXEN DR 500 MG EC tablet Take 1 tablet by mouth 2 (two) times daily.    Marland Kitchen OVER THE COUNTER MEDICATION Multiple Vitamin Gummy    . PROAIR HFA 108 (90 BASE) MCG/ACT inhaler Inhale 2 puffs into the lungs every 6 (six) hours.  0   No facility-administered medications prior to visit.     EXAM:  BP 110/72 mmHg  Temp(Src) 98.7 F (37.1 C) (Oral)  Wt 107 lb 1.6 oz (48.58 kg)  Body mass index is 20.25 kg/(m^2).  GENERAL: vitals reviewed and listed above, alert, oriented, appears well hydrated and in no acute distress slender healthy-appearing  HEENT: atraumatic, conjunctiva  clear, no obvious abnormalities on inspection of  external nose and ears NECK: no obvious masses on inspection palpation  LUNGS: clear to auscultation bilaterally, no wheezes, rales or rhonchi, good air movement CV: HRRR, no clubbing cyanosis or  peripheral edema nl cap refill  Back right lower SI area pain gait slightly antalgic negative SLR reflexes are present but she did have her shoes on. Looks mildly uncomfortable prefers to sit with leg extended.  MS: moves all extremities without noticeable focal  abnormality otherwise looks uncomfortable no obvious atrophy PSYCH: pleasant and cooperative, no obvious depression or anxiety  ASSESSMENT AND PLAN:  Discussed the following assessment and plan:  Low back pain with radiation, right Hx of rheum eva; fpr  ,s sx  In past  Baseline back discomfort but progressive persistent symptoms over the last 2 months with reading pound on the right leg. It appears that she is under Ortho  Care evaluation by Raliegh Ip for her right neck shoulder in her knee but not her back. Asks for help in this area. Discussed options I think she should see a spine specialist consideration of advanced imaging based on her persistent symptoms that are recently progressing. Will arrange a referral to neurosurgery. Would use caution with belts as it can cause atrophy of truncal muscles. She denies depression related to all of her musculoskeletal problems but states that she does just keep going. -Patient advised to return or notify health care team  if symptoms worsen ,persist or new concerns arise. Total visit 67mins > 50% spent counseling and coordinating care as indicated in above note and in instructions to patient .    Patient Instructions  Physical therapy  may help  But I agree . Refer for  Evaluation  Back specialists neurosurgery  And may benefit from MRI of back  But.sometimes back is an issues when trying to treat the other problems .   I don't have the records to review about the other pain problems .         Standley Brooking. Panosh M.D.

## 2015-09-03 NOTE — Patient Instructions (Signed)
Physical therapy  may help  But I agree . Refer for  Evaluation  Back specialists neurosurgery  And may benefit from MRI of back  But.sometimes back is an issues when trying to treat the other problems .   I don't have the records to review about the other pain problems .

## 2015-09-12 ENCOUNTER — Encounter: Payer: Self-pay | Admitting: Internal Medicine

## 2015-10-05 ENCOUNTER — Other Ambulatory Visit: Payer: Self-pay | Admitting: Internal Medicine

## 2015-10-05 NOTE — Telephone Encounter (Signed)
Ok to refill 90 days  

## 2015-10-05 NOTE — Telephone Encounter (Signed)
Sent to the pharmacy by e-scribe. 

## 2015-11-04 ENCOUNTER — Other Ambulatory Visit: Payer: Self-pay | Admitting: Neurological Surgery

## 2015-11-04 DIAGNOSIS — M545 Low back pain: Principal | ICD-10-CM

## 2015-11-04 DIAGNOSIS — G8929 Other chronic pain: Secondary | ICD-10-CM

## 2015-11-18 ENCOUNTER — Ambulatory Visit
Admission: RE | Admit: 2015-11-18 | Discharge: 2015-11-18 | Disposition: A | Discharge status: Home / Self Care | Payer: No Typology Code available for payment source | Source: Ambulatory Visit | Attending: Neurological Surgery | Admitting: Neurological Surgery

## 2015-11-18 DIAGNOSIS — M545 Low back pain: Principal | ICD-10-CM

## 2015-11-18 DIAGNOSIS — G8929 Other chronic pain: Secondary | ICD-10-CM

## 2015-11-18 MED ORDER — IOHEXOL 180 MG/ML  SOLN
1.0000 mL | Freq: Once | INTRAMUSCULAR | Status: AC | PRN
  Administered 2015-11-18: 1 mL via EPIDURAL

## 2015-11-18 MED ORDER — METHYLPREDNISOLONE ACETATE 40 MG/ML INJ SUSP (RADIOLOG
120.0000 mg | Freq: Once | INTRAMUSCULAR | Status: AC
  Administered 2015-11-18: 120 mg via EPIDURAL

## 2015-11-18 NOTE — Discharge Instructions (Signed)

## 2015-11-22 ENCOUNTER — Other Ambulatory Visit: Payer: Self-pay

## 2015-11-22 LAB — HM MAMMOGRAPHY

## 2015-11-23 ENCOUNTER — Encounter: Payer: Self-pay | Admitting: Family Medicine

## 2016-01-05 ENCOUNTER — Other Ambulatory Visit: Payer: Self-pay | Admitting: Internal Medicine

## 2016-04-10 ENCOUNTER — Other Ambulatory Visit: Payer: Self-pay | Admitting: Internal Medicine

## 2016-04-12 NOTE — Telephone Encounter (Signed)
Ok to refill x 6 months 

## 2016-04-13 NOTE — Telephone Encounter (Signed)
Sent to the pharmacy by e-scribe. 

## 2016-07-17 ENCOUNTER — Other Ambulatory Visit (INDEPENDENT_AMBULATORY_CARE_PROVIDER_SITE_OTHER): Payer: No Typology Code available for payment source

## 2016-07-17 DIAGNOSIS — R739 Hyperglycemia, unspecified: Secondary | ICD-10-CM

## 2016-07-17 DIAGNOSIS — Z0000 Encounter for general adult medical examination without abnormal findings: Secondary | ICD-10-CM | POA: Diagnosis not present

## 2016-07-17 LAB — POC URINALSYSI DIPSTICK (AUTOMATED)
Glucose, UA: NEGATIVE
LEUKOCYTES UA: NEGATIVE
NITRITE UA: NEGATIVE
PH UA: 7
Spec Grav, UA: 1.02
Urobilinogen, UA: 1

## 2016-07-17 LAB — CBC WITH DIFFERENTIAL/PLATELET
BASOS PCT: 0.2 % (ref 0.0–3.0)
Basophils Absolute: 0 10*3/uL (ref 0.0–0.1)
EOS PCT: 0.5 % (ref 0.0–5.0)
Eosinophils Absolute: 0 10*3/uL (ref 0.0–0.7)
HCT: 38.8 % (ref 36.0–46.0)
HEMOGLOBIN: 13.1 g/dL (ref 12.0–15.0)
LYMPHS ABS: 1.2 10*3/uL (ref 0.7–4.0)
Lymphocytes Relative: 18 % (ref 12.0–46.0)
MCHC: 33.8 g/dL (ref 30.0–36.0)
MCV: 86.1 fl (ref 78.0–100.0)
MONO ABS: 0.4 10*3/uL (ref 0.1–1.0)
Monocytes Relative: 6.2 % (ref 3.0–12.0)
NEUTROS PCT: 75.1 % (ref 43.0–77.0)
Neutro Abs: 4.9 10*3/uL (ref 1.4–7.7)
Platelets: 288 10*3/uL (ref 150.0–400.0)
RBC: 4.51 Mil/uL (ref 3.87–5.11)
RDW: 12.9 % (ref 11.5–15.5)
WBC: 6.5 10*3/uL (ref 4.0–10.5)

## 2016-07-17 LAB — LIPID PANEL
CHOLESTEROL: 201 mg/dL — AB (ref 0–200)
HDL: 73.8 mg/dL (ref >39.00)
LDL CALC: 117 mg/dL — AB (ref 0–99)
NonHDL: 127.37
TRIGLYCERIDES: 52 mg/dL (ref 0.0–149.0)
Total CHOL/HDL Ratio: 3
VLDL: 10.4 mg/dL (ref 0.0–40.0)

## 2016-07-17 LAB — BASIC METABOLIC PANEL
BUN: 21 mg/dL (ref 6–23)
CHLORIDE: 102 meq/L (ref 96–112)
CO2: 27 meq/L (ref 19–32)
Calcium: 9.5 mg/dL (ref 8.4–10.5)
Creatinine, Ser: 0.72 mg/dL (ref 0.40–1.20)
GFR: 90.93 mL/min (ref >60.00)
GLUCOSE: 74 mg/dL (ref 70–99)
Potassium: 3.7 mEq/L (ref 3.5–5.1)
SODIUM: 140 meq/L (ref 135–145)

## 2016-07-17 LAB — MICROALBUMIN / CREATININE URINE RATIO
Creatinine,U: 224.8 mg/dL
MICROALB/CREAT RATIO: 0.4 mg/g (ref 0.0–30.0)
Microalb, Ur: 1 mg/dL (ref 0.0–1.9)

## 2016-07-17 LAB — TSH: TSH: 0.75 u[IU]/mL (ref 0.35–4.50)

## 2016-07-17 LAB — HEPATIC FUNCTION PANEL
ALT: 9 U/L (ref 0–35)
AST: 14 U/L (ref 0–37)
Albumin: 4.7 g/dL (ref 3.5–5.2)
Alkaline Phosphatase: 49 U/L (ref 39–117)
BILIRUBIN TOTAL: 0.8 mg/dL (ref 0.2–1.2)
Bilirubin, Direct: 0.1 mg/dL (ref 0.0–0.3)
Total Protein: 6.9 g/dL (ref 6.0–8.3)

## 2016-07-17 LAB — HEMOGLOBIN A1C: HEMOGLOBIN A1C: 5.4 % (ref 4.6–6.5)

## 2016-07-21 NOTE — Progress Notes (Signed)
Pre visit review using our clinic review tool, if applicable. No additional management support is needed unless otherwise documented below in the visit note.  Chief Complaint  Patient presents with  . Annual Exam    HPI: Patient  Stephanie Hoffman  50 y.o. comes in today for Preventive Health Care visit  batteles lbp and left hip   Sees integrative therapy  Stretches and  Desk changes   Has a fam hx strong of dm . Generally well x recently inc candy at holidays   Health Maintenance  Topic Date Due  . HIV Screening  07/24/2017 (Originally [birth year]/1980)  . PAP SMEAR  09/24/2016  . MAMMOGRAM  11/21/2016  . COLONOSCOPY  01/27/2020  . TETANUS/TDAP  09/25/2023  . INFLUENZA VACCINE  Completed   Health Maintenance Review LIFESTYLE:  Exercise:  Stretch  Still issues with  back  Tobacco/ETS:no Alcohol:  n Sugar beverages: minimal to none  Sleep: 6 hours  Drug use: no  HH of  2 no pets  Work:  11 per day  X 5-6     ROS: periods monthly mostly 4-5 days some cramps  Gest breast tenderness with chocolate  GEN/ HEENT: No fever, significant weight changes sweats headaches vision problems hearing changes, CV/ PULM; No chest pain shortness of breath cough, syncope,edema  change in exercise tolerance. GI /GU: No adominal pain, vomiting, change in bowel habits. No blood in the stool. No significant GU symptoms. SKIN/HEME: ,no acute skin rashes suspicious lesions or bleeding. No lymphadenopathy, nodules, masses.  NEURO/ PSYCH:  No neurologic signs such as weakness numbness. No new depression anxiety. IMM/ Allergy: No unusual infections.  Allergy .   REST of 12 system review negative except as per HPI   Past Medical History:  Diagnosis Date  . Gallstones    removed 2000  . Headache(784.0)   . Heart murmur   . Hyperlipidemia   . Pneumonia   . Varicose veins     Past Surgical History:  Procedure Laterality Date  . CESAREAN SECTION    . CHOLECYSTECTOMY     2000  . KNEE SURGERY     left torn ligament 2007  . SHOULDER SURGERY     right and left    Family History  Problem Relation Age of Onset  . Arthritis Mother   . Diabetes Mother   . Hyperlipidemia Mother   . Heart disease Mother   . Colon cancer Mother 40  . Hypertension Father   . Prostate cancer Maternal Grandfather 80  . Stroke Paternal Grandfather   . Stomach cancer Maternal Grandmother 79  . Esophageal cancer Neg Hx   . Rectal cancer Neg Hx   mom dimentia   Social History   Social History  . Marital status: Married    Spouse name: N/A  . Number of children: N/A  . Years of education: N/A   Social History Main Topics  . Smoking status: Never Smoker  . Smokeless tobacco: Never Used  . Alcohol use No  . Drug use: No  . Sexual activity: Yes     Comment: husband had vastectomy   Other Topics Concern  . 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 BIOTIN 5000 PO  Take 10,000 mg by mouth daily.    . diphenhydramine-acetaminophen (TYLENOL PM) 25-500 MG TABS Take 1 tablet by mouth at bedtime as needed (sleep).     Marland Kitchen NAPROXEN DR 500 MG EC tablet Take 1 tablet by mouth 2 (two) times daily.    Marland Kitchen OVER THE COUNTER MEDICATION Multiple Vitamin Gummy    . gabapentin (NEURONTIN) 300 MG capsule TAKE ONE CAPSULE BY MOUTH 3 TIMES A DAY 270 capsule 1   No facility-administered medications prior to visit.      EXAM:  BP 112/66   Temp 98.2 F (36.8 C) (Oral)   Ht 5\' 2"  (1.575 m)   Wt 101 lb 12.8 oz (46.2 kg)   LMP 07/05/2016   BMI 18.62 kg/m   Body mass index is 18.62 kg/m.  Physical Exam: Vital signs reviewed [id number] is a well-developed well-nourished alert cooperative    who appearsr stated age in no acute distress.  HEENT: normocephalic atraumatic , Eyes: PERRL EOM's full, conjunctiva  clearglassess, Nares: paten,t no deformity discharge or tenderness., Ears: no deformity EAC's clear TMs with normal landmarks. Mouth: clear OP, no lesions, edema.  Moist mucous membranes. Dentition in adequate repair. NECK: supple without masses, thyromegaly or bruits. CHEST/PULM:  Clear to auscultation and percussion breath sounds equal no wheeze , rales or rhonchi. No chest wall deformities or tenderness. Breast lumpiness and tender no discrete lesion  CV: PMI is nondisplaced, S1 S2 no gallops, murmurs, rubs. Peripheral pulses are full without delay.No JVD .  ABDOMEN: Bowel sounds normal nontender  No guard or rebound, no hepato splenomegal no CVA tenderness.  No hernia. Extremtities:  No clubbing cyanosis or edema, no acute joint swelling or redness no focal atrophy NEURO:  Oriented x3, cranial nerves 3-12 appear to be intact, no obvious focal weakness,gait within normal limits no abnormal reflexes or asymmetrical SKIN: No acute rashes normal turgor, color, no bruising or petechiae. PSYCH: Oriented, good eye contact, no obvious depression anxiety, cognition and judgment appear normal. LN: no cervical axillary inguinal adenopathy  Lab Results  Component Value Date   WBC 6.5 07/17/2016   HGB 13.1 07/17/2016   HCT 38.8 07/17/2016   PLT 288.0 07/17/2016   GLUCOSE 74 07/17/2016   CHOL 201 (H) 07/17/2016   TRIG 52.0 07/17/2016   HDL 73.80 07/17/2016   LDLDIRECT 136.9 08/27/2013   LDLCALC 117 (H) 07/17/2016   ALT 9 07/17/2016   AST 14 07/17/2016   NA 140 07/17/2016   K 3.7 07/17/2016   CL 102 07/17/2016   CREATININE 0.72 07/17/2016   BUN 21 07/17/2016   CO2 27 07/17/2016   TSH 0.75 07/17/2016   HGBA1C 5.4 07/17/2016   MICROALBUR 1.0 07/17/2016   Lab reviewed with pt ASSESSMENT AND PLAN:  Discussed the following assessment and plan:  Visit for preventive health examination  Family history of diabetes mellitus  Need for prophylactic vaccination and inoculation against influenza  - Plan: Flu Vaccine QUAD 36+ mos PF IM (Fluarix & Fluzone Quad PF) fam hx dm  Mom sib  and  Mom dementia and renal disease  Disc prevention  Yearly check lab and hg a1c  Patient Care Team: Burnis Medin, MD as PCP - General Elsie Saas, MD (Orthopedic Surgery) Milly Jakob, MD as Consulting Physician (Orthopedic Surgery) Hennie Duos, MD as Consulting Physician (Rheumatology) Patient Instructions  Continue lifestyle intervention healthy eating and exercise . Add aerobic if possible  Healthy lifestyle includes : At least 150 minutes of exercise weeks  , weight  at healthy levels, which is usually   BMI 19-25. Avoid trans fats and processed foods;  Increase fresh fruits and veges to 5 servings per day. And avoid sweet beverages including tea and juice. Mediterranean diet with olive oil and nuts have been noted to be heart and brain healthy . Avoid tobacco products . Limit  alcohol to  7 per week for women and 14 servings for men.  Get adequate sleep . Wear seat belts . Don't text and drive .  You labs are good.   Last pap 2015 and can repeat in 3-5 years  Continue  Activity to help your  Back  And  Joint pains etc .   Wellness check with  Hg a1c and cpx labs in a    Year or as needed    Mariann Laster K. Leota Maka M.D.

## 2016-07-25 ENCOUNTER — Ambulatory Visit (INDEPENDENT_AMBULATORY_CARE_PROVIDER_SITE_OTHER): Payer: No Typology Code available for payment source | Admitting: Internal Medicine

## 2016-07-25 ENCOUNTER — Encounter: Payer: Self-pay | Admitting: Internal Medicine

## 2016-07-25 VITALS — BP 112/66 | Temp 98.2°F | Ht 62.0 in | Wt 101.8 lb

## 2016-07-25 DIAGNOSIS — Z833 Family history of diabetes mellitus: Secondary | ICD-10-CM

## 2016-07-25 DIAGNOSIS — Z0000 Encounter for general adult medical examination without abnormal findings: Secondary | ICD-10-CM

## 2016-07-25 DIAGNOSIS — Z23 Encounter for immunization: Secondary | ICD-10-CM

## 2016-07-25 NOTE — Patient Instructions (Signed)
Continue lifestyle intervention healthy eating and exercise . Add aerobic if possible  Healthy lifestyle includes : At least 150 minutes of exercise weeks  , weight at healthy levels, which is usually   BMI 19-25. Avoid trans fats and processed foods;  Increase fresh fruits and veges to 5 servings per day. And avoid sweet beverages including tea and juice. Mediterranean diet with olive oil and nuts have been noted to be heart and brain healthy . Avoid tobacco products . Limit  alcohol to  7 per week for women and 14 servings for men.  Get adequate sleep . Wear seat belts . Don't text and drive .  You labs are good.   Last pap 2015 and can repeat in 3-5 years  Continue  Activity to help your  Back  And  Joint pains etc .   Wellness check with  Hg a1c and cpx labs in a    Year or as needed

## 2016-09-10 ENCOUNTER — Emergency Department (HOSPITAL_COMMUNITY): Payer: No Typology Code available for payment source

## 2016-09-10 ENCOUNTER — Encounter (HOSPITAL_COMMUNITY): Payer: Self-pay | Admitting: Emergency Medicine

## 2016-09-10 ENCOUNTER — Emergency Department (HOSPITAL_COMMUNITY)
Admission: EM | Admit: 2016-09-10 | Discharge: 2016-09-10 | Disposition: A | Discharge status: Home / Self Care | Payer: No Typology Code available for payment source | Attending: Emergency Medicine | Admitting: Emergency Medicine

## 2016-09-10 DIAGNOSIS — R0789 Other chest pain: Secondary | ICD-10-CM | POA: Insufficient documentation

## 2016-09-10 DIAGNOSIS — R079 Chest pain, unspecified: Secondary | ICD-10-CM

## 2016-09-10 LAB — BASIC METABOLIC PANEL
Anion gap: 7 (ref 5–15)
BUN: 21 mg/dL — AB (ref 6–20)
CO2: 28 mmol/L (ref 22–32)
Calcium: 8.4 mg/dL — ABNORMAL LOW (ref 8.9–10.3)
Chloride: 105 mmol/L (ref 101–111)
Creatinine, Ser: 0.7 mg/dL (ref 0.44–1.00)
GFR calc Af Amer: >60 mL/min (ref >60)
GFR calc non Af Amer: >60 mL/min (ref >60)
GLUCOSE: 88 mg/dL (ref 65–99)
POTASSIUM: 3 mmol/L — AB (ref 3.5–5.1)
Sodium: 140 mmol/L (ref 135–145)

## 2016-09-10 LAB — CBC
HEMATOCRIT: 35.9 % — AB (ref 36.0–46.0)
HEMOGLOBIN: 12.3 g/dL (ref 12.0–15.0)
MCH: 29.1 pg (ref 26.0–34.0)
MCHC: 34.3 g/dL (ref 30.0–36.0)
MCV: 85.1 fL (ref 78.0–100.0)
Platelets: 270 10*3/uL (ref 150–400)
RBC: 4.22 MIL/uL (ref 3.87–5.11)
RDW: 12.5 % (ref 11.5–15.5)
WBC: 10.9 10*3/uL — AB (ref 4.0–10.5)

## 2016-09-10 LAB — I-STAT TROPONIN, ED: Troponin i, poc: 0 ng/mL (ref 0.00–0.08)

## 2016-09-10 MED ORDER — SUCRALFATE 1 GM/10ML PO SUSP: 1.0000 g | Freq: Three times a day (TID) | ORAL | 0 refills | Status: DC

## 2016-09-10 MED ORDER — GI COCKTAIL ~~LOC~~
30.0000 mL | Freq: Once | ORAL | Status: AC
  Administered 2016-09-10: 30 mL via ORAL
  Filled 2016-09-10: qty 30

## 2016-09-10 MED ORDER — ASPIRIN 81 MG PO CHEW
324.0000 mg | CHEWABLE_TABLET | Freq: Once | ORAL | Status: AC
  Administered 2016-09-10: 324 mg via ORAL
  Filled 2016-09-10: qty 4

## 2016-09-10 MED ORDER — OMEPRAZOLE 20 MG PO CPDR: 20.0000 mg | DELAYED_RELEASE_CAPSULE | Freq: Every day | ORAL | 1 refills | Status: DC

## 2016-09-10 NOTE — Discharge Instructions (Signed)

## 2016-09-10 NOTE — ED Triage Notes (Signed)
Patient reports generalized chest pain starting last night at about 10:00. Patient also reports lightheadedness and weakness. Denies shortness of breath/nausea/vomiting. Reports recently being dx with bronchitis.

## 2016-09-10 NOTE — ED Provider Notes (Signed)
Emergency Department Provider Note   I have reviewed the triage vital signs and the nursing notes.   HISTORY  Chief Complaint Chest Pain   HPI Stephanie Hoffman is a 51 y.o. female with PMH of HLD and gallstone s/p cholecystectomy in 2000 presents to the emergency room in for evaluation of chest pressure that has been constant since 10 PM yesterday. She reports pain intermittently throughout the week that became constant last night without obvious provoking factor. The pain is nonradiating, centered her chest and moderately severe. She denies any associated dyspnea. No improvement with position changes. She tried Pepto-Bismol with no relief.   Past Medical History:  Diagnosis Date  . Gallstones    removed 2000  . Headache(784.0)   . Heart murmur   . Hyperlipidemia   . Pneumonia   . Varicose veins     Patient Active Problem List   Diagnosis Date Noted  . RUQ abdominal pain 12/02/2014  . Loss of weight 12/02/2014  . Bowel habit changes 12/02/2014  . Family history of colon cancer 12/02/2014  . Arm pain, right/wrist.  11/17/2013  . Skin lump of arm right  11/17/2013  . Neck pain on right side 11/17/2013  . Hyperglycemia 09/24/2013  . Family hx of colon cancer 07/12/2012  . Head pain  right occiput. 02/19/2012  . ARTHRITIS, LEFT SHOULDER 04/13/2010  . BREAST MASS, LEFT 02/04/2010  . HEADACHE 05/02/2007    Past Surgical History:  Procedure Laterality Date  . CESAREAN SECTION    . CHOLECYSTECTOMY     2000  . KNEE SURGERY     left torn ligament 2007  . SHOULDER SURGERY     right and left    Current Outpatient Rx  . Order #: [id number] Class: Historical Med  . Order #: [id number] Class: Historical Med  . Order #: [id number] Class: Historical Med  . Order #: [id number] Class: Historical Med  . Order #: [id number] Class: Historical Med  . Order #: [id number] Class: Historical Med  . Order #: [id number] Class: Historical Med  . Order #: [id number] Class: Historical Med  .  Order #: [id number] Class: Print  . Order #: [id number] Class: Historical Med  . Order #: [id number] Class: Historical Med  . Order #: [id number] Class: Print    Allergies Codeine and Penicillins  Family History  Problem Relation Age of Onset  . Arthritis Mother   . Diabetes Mother   . Hyperlipidemia Mother   . Heart disease Mother   . Colon cancer Mother 81  . Hypertension Father   . Prostate cancer Maternal Grandfather 80  . Stroke Paternal Grandfather   . Stomach cancer Maternal Grandmother 79  . Esophageal cancer Neg Hx   . Rectal cancer Neg Hx     Social History Social History  Substance Use Topics  . Smoking status: Never Smoker  . Smokeless tobacco: Never Used  . Alcohol use No    Review of Systems  Constitutional: No fever/chills Eyes: No visual changes. ENT: No sore throat. Cardiovascular: Positive chest pain. Respiratory: Denies shortness of breath. Gastrointestinal: No abdominal pain.  No nausea, no vomiting.  No diarrhea.  No constipation. Genitourinary: Negative for dysuria. Musculoskeletal: Negative for back pain. Skin: Negative for rash. Neurological: Negative for headaches, focal weakness or numbness.  10-point ROS otherwise negative.  ____________________________________________   PHYSICAL EXAM:  VITAL SIGNS: ED Triage Vitals  Enc Vitals Group     BP 09/10/16 0755 121/75     Pulse Rate 09/10/16 0755 73     Resp 09/10/16 0755 18  Temp 09/10/16 0755 98.5 F (36.9 C)     Temp Source 09/10/16 0755 Oral     SpO2 09/10/16 0755 96 %     Weight 09/10/16 0756 101 lb (45.8 kg)     Height 09/10/16 0756 5\' 1"  (1.549 m)     Pain Score 09/10/16 0757 7   Constitutional: Alert and oriented. Well appearing and in no acute distress. Eyes: Conjunctivae are normal.  Head: Atraumatic. Nose: No congestion/rhinnorhea. Mouth/Throat: Mucous membranes are moist.  Oropharynx non-erythematous. Neck: No stridor.   Cardiovascular: Normal rate, regular rhythm.  Good peripheral circulation. Grossly normal heart sounds.   Respiratory: Normal respiratory effort.  No retractions. Lungs CTAB. Gastrointestinal: Soft and nontender. No distention.  Musculoskeletal: No lower extremity tenderness nor edema. No gross deformities of extremities. Neurologic:  Normal speech and language. No gross focal neurologic deficits are appreciated.  Skin:  Skin is warm, dry and intact. No rash noted. Psychiatric: Mood and affect are flat. Speech and behavior are normal.  ____________________________________________   LABS (all labs ordered are listed, but only abnormal results are displayed)  Labs Reviewed  BASIC METABOLIC PANEL - Abnormal; Notable for the following:       Result Value   Potassium 3.0 (*)    BUN 21 (*)    Calcium 8.4 (*)    All other components within normal limits  CBC - Abnormal; Notable for the following:    WBC 10.9 (*)    HCT 35.9 (*)    All other components within normal limits  I-STAT TROPOININ, ED   ____________________________________________  EKG   EKG Interpretation  Date/Time:  Sunday September 10 2016 08:02:30 EST Ventricular Rate:  72 PR Interval:    QRS Duration: 82 QT Interval:  380 QTC Calculation: 416 R Axis:   80 Text Interpretation:  Sinus rhythm No STEMI.  Confirmed by Joh Rao MD, Jodelle Fausto [phone]) on 09/10/2016 8:11:02 AM       ____________________________________________  RADIOLOGY  Dg Chest 2 View  Result Date: 09/10/2016 CLINICAL DATA:  Generalized chest pain. Lightheadedness and weakness. EXAM: CHEST  2 VIEW COMPARISON:  01/14/2015. FINDINGS: Lungs are clear. Normal cardiomediastinal silhouette. No osseous findings. No change from priors. IMPRESSION: Negative chest. Electronically Signed   By: Staci Righter M.D.   On: 09/10/2016 08:28    ____________________________________________   PROCEDURES  Procedure(s) performed:   Procedures  None ____________________________________________   INITIAL  IMPRESSION / ASSESSMENT AND PLAN / ED COURSE  Pertinent labs & imaging results that were available during my care of the patient were reviewed by me and considered in my medical decision making (see chart for details).  Patient presents to the emergency department for evaluation of chest pressure since 10 PM yesterday. No associated symptoms. No modifying factors. Pain has been constant. Low risk for MACE by HEART with score of 2 (age and risk factors). Low risk for PE by [place] and PERC negative. Plan for biomarker and chest x-ray. Will give aspirin and GI cocktail. EKG is unremarkable.  09:08 AM Patient has improved symptoms after GI cocktail. Troponins negative with approximately 11 hours of continuous chest pain. Normal chest x-ray. Plan for discharge after starting the patient on PPI and Carafate. She will call her primary care physician tomorrow to discuss outpatient stress testing.  At this time, I do not feel there is any life-threatening condition present. I have reviewed and discussed all results (EKG, imaging, lab, urine as appropriate), exam findings with patient. I have reviewed nursing notes and appropriate  previous records.  I feel the patient is safe to be discharged home without further emergent workup. Discussed usual and customary return precautions. Patient and family (if present) verbalize understanding and are comfortable with this plan.  Patient will follow-up with their primary care provider. If they do not have a primary care provider, information for follow-up has been provided to them. All questions have been answered.  ____________________________________________  FINAL CLINICAL IMPRESSION(S) / ED DIAGNOSES  Final diagnoses:  Nonspecific chest pain     MEDICATIONS GIVEN DURING THIS VISIT:  Medications  aspirin chewable tablet 324 mg (324 mg Oral Given 09/10/16 0827)  gi cocktail (Maalox,Lidocaine,Donnatal) (30 mLs Oral Given 09/10/16 0828)     NEW OUTPATIENT  MEDICATIONS STARTED DURING THIS VISIT:  New Prescriptions   OMEPRAZOLE (PRILOSEC) 20 MG CAPSULE    Take 1 capsule (20 mg total) by mouth daily.   SUCRALFATE (CARAFATE) 1 GM/10ML SUSPENSION    Take 10 mLs (1 g total) by mouth 4 (four) times daily -  with meals and at bedtime.      Note:  This document was prepared using Dragon voice recognition software and may include unintentional dictation errors.  Nanda Quinton, MD Emergency Medicine   Margette Fast, MD 09/10/16 [phone]

## 2016-11-27 ENCOUNTER — Encounter: Payer: Self-pay | Admitting: Emergency Medicine

## 2016-11-27 ENCOUNTER — Ambulatory Visit: Payer: No Typology Code available for payment source | Admitting: Family Medicine

## 2016-11-27 ENCOUNTER — Ambulatory Visit (INDEPENDENT_AMBULATORY_CARE_PROVIDER_SITE_OTHER): Payer: No Typology Code available for payment source | Admitting: Emergency Medicine

## 2016-11-27 VITALS — BP 104/64 | HR 88 | Temp 98.8°F | Resp 17 | Ht 61.0 in | Wt 101.0 lb

## 2016-11-27 DIAGNOSIS — N3001 Acute cystitis with hematuria: Secondary | ICD-10-CM | POA: Diagnosis not present

## 2016-11-27 DIAGNOSIS — R300 Dysuria: Secondary | ICD-10-CM

## 2016-11-27 LAB — POCT URINALYSIS DIP (MANUAL ENTRY)
Bilirubin, UA: NEGATIVE
Glucose, UA: NEGATIVE mg/dL
Ketones, POC UA: NEGATIVE mg/dL
NITRITE UA: NEGATIVE
PH UA: 6 (ref 5.0–8.0)
Protein Ur, POC: 100 mg/dL — AB
Spec Grav, UA: >=1.03 — AB (ref 1.010–1.025)
UROBILINOGEN UA: 0.2 U/dL

## 2016-11-27 LAB — POC MICROSCOPIC URINALYSIS (UMFC): Mucus: ABSENT

## 2016-11-27 MED ORDER — CIPROFLOXACIN HCL 250 MG PO TABS: 250.0000 mg | ORAL_TABLET | Freq: Two times a day (BID) | ORAL | 0 refills | Status: AC

## 2016-11-27 NOTE — Progress Notes (Signed)
Stephanie Hoffman 51 y.o.   Chief Complaint  Patient presents with  . Dysuria    onset today  . Hematuria    onset today  . Urinary Frequency    onset 4 days    HISTORY OF PRESENT ILLNESS: This is a 51 y.o. female complaining of UTI symptoms x 2-3 days.  Dysuria   This is a new problem. The current episode started in the past 7 days. The problem occurs every urination. The problem has been gradually worsening. The quality of the pain is described as burning. The pain is at a severity of 3/10. The pain is mild. There has been no fever. There is no history of pyelonephritis. Associated symptoms include frequency, hematuria and urgency. Pertinent negatives include no chills, flank pain, nausea or vomiting.     Prior to Admission medications   Medication Sig Start Date End Date Taking? Authorizing Provider  BIOTIN 5000 PO Take 10,000 mg by mouth daily.   Yes Historical Provider, MD  diphenhydramine-acetaminophen (TYLENOL PM) 25-500 MG TABS Take 1 tablet by mouth at bedtime as needed (sleep).    Yes Historical Provider, MD  Magnesium 250 MG TABS Take 250 mg by mouth daily.   Yes Historical Provider, MD  OVER THE COUNTER MEDICATION Multiple Vitamin Gummy   Yes Historical Provider, MD  Potassium 99 MG TABS Take 99 mg by mouth daily.   Yes Historical Provider, MD  PROAIR HFA 108 (90 Base) MCG/ACT inhaler Inhale 1 puff into the lungs as needed for shortness of breath. 08/12/16  Yes Historical Provider, MD    Allergies  Allergen Reactions  . Codeine Itching  . Penicillins Rash    Has patient had a PCN reaction causing immediate rash, facial/tongue/throat swelling, SOB or lightheadedness with hypotension: No Has patient had a PCN reaction causing severe rash involving mucus membranes or skin necrosis: No Has patient had a PCN reaction that required hospitalization: No Has patient had a PCN reaction occurring within the last 10 years: No If all of the above answers are "NO", then may  proceed with Cephalosporin use.     Patient Active Problem List   Diagnosis Date Noted  . RUQ abdominal pain 12/02/2014  . Loss of weight 12/02/2014  . Bowel habit changes 12/02/2014  . Family history of colon cancer 12/02/2014  . Arm pain, right/wrist.  11/17/2013  . Skin lump of arm right  11/17/2013  . Neck pain on right side 11/17/2013  . Hyperglycemia 09/24/2013  . Family hx of colon cancer 07/12/2012  . Head pain  right occiput. 02/19/2012  . ARTHRITIS, LEFT SHOULDER 04/13/2010  . BREAST MASS, LEFT 02/04/2010  . HEADACHE 05/02/2007    Past Medical History:  Diagnosis Date  . Gallstones    removed 2000  . Headache(784.0)   . Heart murmur   . Hyperlipidemia   . Pneumonia   . Varicose veins     Past Surgical History:  Procedure Laterality Date  . CESAREAN SECTION    . CHOLECYSTECTOMY     2000  . KNEE SURGERY     left torn ligament 2007  . SHOULDER SURGERY     right and left    Social History   Social History  . Marital status: Married    Spouse name: N/A  . Number of children: N/A  . Years of education: N/A   Occupational History  . Not on file.   Social History Main Topics  . Smoking status: Never Smoker  . Smokeless  tobacco: Never Used  . Alcohol use No  . Drug use: No  . Sexual activity: Yes     Comment: husband had vastectomy   Other Topics Concern  . 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 Arthritis Mother   . Diabetes Mother   . Hyperlipidemia Mother   . Heart disease Mother   . Colon cancer Mother 38  . Hypertension Father   . Prostate cancer Maternal Grandfather 80  . Stroke Paternal Grandfather   . Stomach cancer Maternal Grandmother 79  . Esophageal cancer Neg Hx   . Rectal cancer Neg Hx      Review of Systems  Constitutional:  Negative for chills and fever.  HENT: Negative.   Eyes: Negative.   Respiratory: Negative for cough and shortness of breath.   Cardiovascular: Negative for chest pain.  Gastrointestinal: Negative for abdominal pain, nausea and vomiting.  Genitourinary: Positive for dysuria, frequency, hematuria and urgency. Negative for flank pain.  Musculoskeletal: Negative.   Skin: Negative for rash.  Neurological: Negative.   Endo/Heme/Allergies: Negative.   All other systems reviewed and are negative.  Vitals:   11/27/16 1133  BP: 104/64  Pulse: 88  Resp: 17  Temp: 98.8 F (37.1 C)     Physical Exam  Constitutional: She is oriented to person, place, and time. She appears well-developed and well-nourished.  HENT:  Head: Normocephalic and atraumatic.  Eyes: Conjunctivae and EOM are normal. Pupils are equal, round, and reactive to light.  Neck: Normal range of motion. Neck supple.  Cardiovascular: Normal rate, regular rhythm and normal heart sounds.   Pulmonary/Chest: Effort normal and breath sounds normal.  Abdominal: Soft. She exhibits no distension. There is no tenderness.  Musculoskeletal: Normal range of motion.  Neurological: She is alert and oriented to person, place, and time. No sensory deficit. She exhibits normal muscle tone.  Skin: Skin is warm and dry. Capillary refill takes less than 2 seconds. No rash noted.  Psychiatric: She has a normal mood and affect. Her behavior is normal.  Vitals reviewed.    ASSESSMENT & PLAN: Kinzlee was seen today for dysuria, hematuria and urinary frequency.  Diagnoses and all orders for this visit:  Acute cystitis with hematuria  Dysuria -     POCT Microscopic Urinalysis ([facility]) -     POCT urinalysis dipstick -     Urine culture  Other orders -     ciprofloxacin (CIPRO) 250 MG tablet; Take 1 tablet (250 mg total) by mouth 2 (two) times daily.    Patient Instructions       IF you received an x-ray today, you will receive an  invoice from [facility]. Please contact [facility] at [phone] with questions or concerns regarding your invoice.   IF you received labwork today, you will receive an invoice from [facility]. Please contact [facility] at [phone] with questions or concerns regarding your invoice.   Our billing staff will not be able to assist you with questions regarding bills from these companies.  You will be contacted with the lab results as soon as they are available. The fastest way to get your results is to activate your My Chart account. Instructions are located on the last page  of this paperwork. If you have not heard from [place] regarding the results in 2 weeks, please contact this office.     Urinary Tract Infection, Adult A urinary tract infection (UTI) is an infection of any part of the urinary tract. The urinary tract includes the:  Kidneys.  Ureters.  Bladder.  Urethra. These organs make, store, and get rid of pee (urine) in the body. Follow these instructions at home:  Take over-the-counter and prescription medicines only as told by your doctor.  If you were prescribed an antibiotic medicine, take it as told by your doctor. Do not stop taking the antibiotic even if you start to feel better.  Avoid the following drinks:  Alcohol.  Caffeine.  Tea.  Carbonated drinks.  Drink enough fluid to keep your pee clear or pale yellow.  Keep all follow-up visits as told by your doctor. This is important.  Make sure to:  Empty your bladder often and completely. Do not to hold pee for long periods of time.  Empty your bladder before and after sex.  Wipe from front to back after a bowel movement if you are female. Use each tissue one time when you wipe. Contact a doctor if:  You have back pain.  You have a fever.  You feel sick to your stomach (nauseous).  You throw up (vomit).  Your symptoms do not get better after 3 days.  Your symptoms go away and then  come back. Get help right away if:  You have very bad back pain.  You have very bad lower belly (abdominal) pain.  You are throwing up and cannot keep down any medicines or water. This information is not intended to replace advice given to you by your health care provider. Make sure you discuss any questions you have with your health care provider. Document Released: 01/03/2008 Document Revised: 12/23/2015 Document Reviewed: 06/07/2015 Elsevier Interactive Patient Education  2017 Elsevier Inc.      Agustina Caroli, MD [facility]

## 2016-11-27 NOTE — Patient Instructions (Addendum)
     IF you received an x-ray today, you will receive an invoice from [facility]. Please contact [facility] at [phone] with questions or concerns regarding your invoice.   IF you received labwork today, you will receive an invoice from [facility]. Please contact [facility] at [phone] with questions or concerns regarding your invoice.   Our billing staff will not be able to assist you with questions regarding bills from these companies.  You will be contacted with the lab results as soon as they are available. The fastest way to get your results is to activate your My Chart account. Instructions are located on the last page of this paperwork. If you have not heard from us regarding the results in 2 weeks, please contact this office.      Urinary Tract Infection, Adult A urinary tract infection (UTI) is an infection of any part of the urinary tract. The urinary tract includes the:  Kidneys.  Ureters.  Bladder.  Urethra. These organs make, store, and get rid of pee (urine) in the body. Follow these instructions at home:  Take over-the-counter and prescription medicines only as told by your doctor.  If you were prescribed an antibiotic medicine, take it as told by your doctor. Do not stop taking the antibiotic even if you start to feel better.  Avoid the following drinks:  Alcohol.  Caffeine.  Tea.  Carbonated drinks.  Drink enough fluid to keep your pee clear or pale yellow.  Keep all follow-up visits as told by your doctor. This is important.  Make sure to:  Empty your bladder often and completely. Do not to hold pee for long periods of time.  Empty your bladder before and after sex.  Wipe from front to back after a bowel movement if you are female. Use each tissue one time when you wipe. Contact a doctor if:  You have back pain.  You have a fever.  You feel sick to your stomach (nauseous).  You throw up (vomit).  Your symptoms do  not get better after 3 days.  Your symptoms go away and then come back. Get help right away if:  You have very bad back pain.  You have very bad lower belly (abdominal) pain.  You are throwing up and cannot keep down any medicines or water. This information is not intended to replace advice given to you by your health care provider. Make sure you discuss any questions you have with your health care provider. Document Released: 01/03/2008 Document Revised: 12/23/2015 Document Reviewed: 06/07/2015 Elsevier Interactive Patient Education  2017 Elsevier Inc.  

## 2016-11-29 LAB — URINE CULTURE

## 2017-04-20 ENCOUNTER — Encounter: Payer: Self-pay | Admitting: Internal Medicine

## 2017-07-14 LAB — HM MAMMOGRAPHY

## 2017-08-06 ENCOUNTER — Encounter: Payer: Self-pay | Admitting: Internal Medicine

## 2017-08-07 NOTE — Progress Notes (Signed)
Chief Complaint  Patient presents with  . Annual Exam    Some headaches but poatient feels related to vision/glasses Rx.     HPI: Patient  Stephanie Hoffman  52 y.o. comes in today for Preventive Health Care visit  workign with murphy wianer about her back and upper body pains   Driving long ways  And muscle tensino adding to issues   Father had stroke  Has to visit   Both parents     Driving job   Under  Ortho care   Due for pap  hasnt had colon yet   Mom hx colon cancer   Health Maintenance  Topic Date Due  . HIV Screening  12/29/1980  . PAP SMEAR  09/24/2016  . INFLUENZA VACCINE  02/28/2017  . MAMMOGRAM  07/14/2018  . COLONOSCOPY  01/27/2020  . TETANUS/TDAP  09/25/2023   Health Maintenance Review LIFESTYLE:  Exercise:  Not a lot  Steps .  Tries to stretch.  Tobacco/ETS: no Alcohol:   no Sugar beverages: n  2 per week Sleep:  6   Drug use: no HH of  2+   None  Work: 5 days   7- 4  m - fri   [place]  lmp  Had 2 then skipping.   Previously   lmo dec 17th  No hx of abnormal   ROS:   Dr Noemi Chapel  Injections   And has up coming  For back .  GEN/ HEENT: No fever, significant weight changes sweats headaches vision problems hearing changes, CV/ PULM; No chest pain shortness of breath cough, syncope,edema  change in exercise tolerance. GI /GU: No adominal pain, vomiting, change in bowel habits. No blood in the stool. No significant GU symptoms. SKIN/HEME: ,no acute skin rashes suspicious lesions or bleeding. No lymphadenopathy, nodules, masses.  NEURO/ PSYCH:  No neurologic signs such as weakness numbness. No depression anxiety. IMM/ Allergy: No unusual infections.  Allergy .   REST of 12 system review negative except as per HPI   Past Medical History:  Diagnosis Date  . Gallstones    removed 2000  . Headache(784.0)   . Heart murmur   . Hyperlipidemia   . Pneumonia   . Varicose veins     Past Surgical History:  Procedure Laterality Date  . CESAREAN SECTION      . CHOLECYSTECTOMY     2000  . KNEE SURGERY     left torn ligament 2007  . SHOULDER SURGERY     right and left    Family History  Problem Relation Age of Onset  . Arthritis Mother   . Diabetes Mother   . Hyperlipidemia Mother   . Heart disease Mother   . Colon cancer Mother 3  . Hypertension Father   . Prostate cancer Maternal Grandfather 80  . Stroke Paternal Grandfather   . Stomach cancer Maternal Grandmother 79  . Esophageal cancer Neg Hx   . Rectal cancer Neg Hx     Social History   Socioeconomic History  . Marital status: Married    Spouse name: None  . Number of children: None  . Years of education: None  . Highest education level: None  Social Needs  . Financial resource strain: None  . Food insecurity - worry: None  . Food insecurity - inability: None  . Transportation needs - medical: None  . Transportation needs - non-medical: None  Occupational History  . None  Tobacco Use  . Smoking status: Never  Smoker  . Smokeless tobacco: Never Used  Substance and Sexual Activity  . Alcohol use: No  . Drug use: No  . Sexual activity: Yes    Comment: husband had vastectomy  Other Topics Concern  . 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 BIOTIN 5000 PO Take 10,000 mg by mouth daily.    . diphenhydramine-acetaminophen (TYLENOL PM) 25-500 MG TABS Take 1 tablet by mouth at bedtime as needed (sleep).     . Magnesium 250 MG TABS Take 250 mg by mouth daily.    Marland Kitchen OVER THE COUNTER MEDICATION Multiple Vitamin Gummy    . Potassium 99 MG TABS Take 99 mg by mouth daily.    Marland Kitchen PROAIR HFA 108 (90 Base) MCG/ACT inhaler Inhale 1 puff into the lungs as needed for shortness of breath.    . Turmeric Curcumin 500 MG CAPS Take 2 capsules by mouth daily.     No  facility-administered medications prior to visit.      EXAM:  BP 102/68 (BP Location: Right Arm, Patient Position: Sitting, Cuff Size: Normal)   Pulse 70   Temp 98.3 F (36.8 C) (Oral)   Ht 5' 1.25" (1.556 m)   Wt 104 lb 1.6 oz (47.2 kg)   BMI 19.51 kg/m   Body mass index is 19.51 kg/m. Wt Readings from Last 3 Encounters:  08/08/17 104 lb 1.6 oz (47.2 kg)  11/27/16 101 lb (45.8 kg)  09/10/16 101 lb (45.8 kg)    Physical Exam: Vital signs reviewed GYB:WLSL is a well-developed well-nourished alert cooperative    who appearsr stated age in no acute distress.  HEENT: normocephalic atraumatic , Eyes: PERRL EOM's full, conjunctiva clear, Nares: paten,t no deformity discharge or tenderness., Ears: no deformity EAC's clear TMs with normal landmarks. Mouth: clear OP, no lesions, edema.  Moist mucous membranes. Dentition in adequate repair. NECK: supple without masses, thyromegaly or bruits. CHEST/PULM:  Clear to auscultation and percussion breath sounds equal no wheeze , rales or rhonchi. No chest wall deformities or tenderness. Breast: normal by inspection . No dimpling, discharge, masses,  Mild tenderness no discharge . CV: PMI is nondisplaced, S1 S2 no gallops, murmurs, rubs. Peripheral pulses are full without delay.No JVD .  ABDOMEN: Bowel sounds normal nontender  No guard or rebound, no hepato splenomegal no CVA tenderness.  No hernia. Extremtities:  No clubbing cyanosis or edema, no acute joint swelling or redness no focal atrophy NEURO:  Oriented x3, cranial nerves 3-12 appear to be intact, no obvious focal weakness,gait within normal limits no abnormal reflexes or asymmetrical SKIN: No acute rashes normal turgor, color, no bruising or petechiae. PSYCH: Oriented, good eye contact, no obvious depression anxiety, cognition and judgment appear normal. LN: no cervical axillary inguinal adenopathy Pelvic: NL ext GU, labia clear without lesions or rash . Vagina no lesions .Cervix: clear   UTERUS: Neg   irreg assymmetric but mobile CMT Adnexa:  clear no masses . PAP done with hr hpv    Lab Results  Component Value Date   WBC 5.0 08/08/2017   HGB 12.9 08/08/2017   HCT 38.7 08/08/2017   PLT 268.0 08/08/2017   GLUCOSE 84 08/08/2017   CHOL 179 08/08/2017   TRIG 42.0 08/08/2017  HDL 60.50 08/08/2017   LDLDIRECT 136.9 08/27/2013   LDLCALC 110 (H) 08/08/2017   ALT 6 08/08/2017   AST 12 08/08/2017   NA 141 08/08/2017   K 3.7 08/08/2017   CL 106 08/08/2017   CREATININE 0.64 08/08/2017   BUN 17 08/08/2017   CO2 27 08/08/2017   TSH 0.93 08/08/2017   HGBA1C 5.7 08/08/2017   MICROALBUR 1.0 07/17/2016    BP Readings from Last 3 Encounters:  08/08/17 102/68  11/27/16 104/64  09/10/16 129/86     ASSESSMENT AND PLAN:  Discussed the following assessment and plan:  Visit for preventive health examination - Plan: Basic metabolic panel, CBC with Differential/Platelet, Hepatic function panel, Lipid panel, TSH, Hemoglobin A1c, Ambulatory referral to Gastroenterology  Encounter for gynecological examination without abnormal finding - Plan: Basic metabolic panel, CBC with Differential/Platelet, Hepatic function panel, Lipid panel, TSH, Hemoglobin A1c, Ambulatory referral to Gastroenterology, Cytology - PAP  Need for influenza vaccination - Plan: Flu Vaccine QUAD 6+ mos PF IM (Fluarix Quad PF)  Family history of colon cancer in mother - Plan: Ambulatory referral to Gastroenterology  Screen for colon cancer - Plan: Ambulatory referral to Gastroenterology  Multiple joint pain - Plan: Basic metabolic panel, CBC with Differential/Platelet, Hepatic function panel, Lipid panel, TSH, Hemoglobin A1c, Ambulatory referral to Gastroenterology  Medication management - Plan: Basic metabolic panel, CBC with Differential/Platelet, Hepatic function panel, Lipid panel, TSH, Hemoglobin A1c, Ambulatory referral to Gastroenterology  Patient Care Team: Burnis Medin, MD as PCP -  Loni Beckwith, MD (Orthopedic Surgery) Milly Jakob, MD as Consulting Physician (Orthopedic Surgery) Hennie Duos, MD as Consulting Physician (Rheumatology) Patient Instructions  Will notify you  of labs when available. You will be contacted about  Colon cancer screening colonoscopy  Plan appt.    Try stretching exercises      Preventive Care 40-64 Years, Female Preventive care refers to lifestyle choices and visits with your health care provider that can promote health and wellness. What does preventive care include?  A yearly physical exam. This is also called an annual well check.  Dental exams once or twice a year.  Routine eye exams. Ask your health care provider how often you should have your eyes checked.  Personal lifestyle choices, including: ? Daily care of your teeth and gums. ? Regular physical activity. ? Eating a healthy diet. ? Avoiding tobacco and drug use. ? Limiting alcohol use. ? Practicing safe sex. ? Taking low-dose aspirin daily starting at age 44. ? Taking vitamin and mineral supplements as recommended by your health care provider. What happens during an annual well check? The services and screenings done by your health care provider during your annual well check will depend on your age, overall health, lifestyle risk factors, and family history of disease. Counseling Your health care provider may ask you questions about your:  Alcohol use.  Tobacco use.  Drug use.  Emotional well-being.  Home and relationship well-being.  Sexual activity.  Eating habits.  Work and work Statistician.  Method of birth control.  Menstrual cycle.  Pregnancy history.  Screening You may have the following tests or measurements:  Height, weight, and BMI.  Blood pressure.  Lipid and cholesterol levels. These may be checked every 5 years, or more frequently if you are over 45 years old.  Skin check.  Lung cancer screening. You may  have this screening every year starting at age 58 if you have a 30-pack-year history of smoking and currently smoke or have  quit within the past 15 years.  Fecal occult blood test (FOBT) of the stool. You may have this test every year starting at age 39.  Flexible sigmoidoscopy or colonoscopy. You may have a sigmoidoscopy every 5 years or a colonoscopy every 10 years starting at age 22.  Hepatitis C blood test.  Hepatitis B blood test.  Sexually transmitted disease (STD) testing.  Diabetes screening. This is done by checking your blood sugar (glucose) after you have not eaten for a while (fasting). You may have this done every 1-3 years.  Mammogram. This may be done every 1-2 years. Talk to your health care provider about when you should start having regular mammograms. This may depend on whether you have a family history of breast cancer.  BRCA-related cancer screening. This may be done if you have a family history of breast, ovarian, tubal, or peritoneal cancers.  Pelvic exam and Pap test. This may be done every 3 years starting at age 58. Starting at age 31, this may be done every 5 years if you have a Pap test in combination with an HPV test.  Bone density scan. This is done to screen for osteoporosis. You may have this scan if you are at high risk for osteoporosis.  Discuss your test results, treatment options, and if necessary, the need for more tests with your health care provider. Vaccines Your health care provider may recommend certain vaccines, such as:  Influenza vaccine. This is recommended every year.  Tetanus, diphtheria, and acellular pertussis (Tdap, Td) vaccine. You may need a Td booster every 10 years.  Varicella vaccine. You may need this if you have not been vaccinated.  Zoster vaccine. You may need this after age 47.  Measles, mumps, and rubella (MMR) vaccine. You may need at least one dose of MMR if you were born in 1957 or later. You may also need a second  dose.  Pneumococcal 13-valent conjugate (PCV13) vaccine. You may need this if you have certain conditions and were not previously vaccinated.  Pneumococcal polysaccharide (PPSV23) vaccine. You may need one or two doses if you smoke cigarettes or if you have certain conditions.  Meningococcal vaccine. You may need this if you have certain conditions.  Hepatitis A vaccine. You may need this if you have certain conditions or if you travel or work in places where you may be exposed to hepatitis A.  Hepatitis B vaccine. You may need this if you have certain conditions or if you travel or work in places where you may be exposed to hepatitis B.  Haemophilus influenzae type b (Hib) vaccine. You may need this if you have certain conditions.  Talk to your health care provider about which screenings and vaccines you need and how often you need them. This information is not intended to replace advice given to you by your health care provider. Make sure you discuss any questions you have with your health care provider. Document Released: 08/13/2015 Document Revised: 04/05/2016 Document Reviewed: 05/18/2015 Elsevier Interactive Patient Education  2018 [employer]. Jahmal Dunavant M.D.

## 2017-08-08 ENCOUNTER — Encounter: Payer: Self-pay | Admitting: Internal Medicine

## 2017-08-08 ENCOUNTER — Other Ambulatory Visit (HOSPITAL_COMMUNITY)
Admission: RE | Admit: 2017-08-08 | Discharge: 2017-08-08 | Disposition: A | Discharge status: Home / Self Care | Payer: No Typology Code available for payment source | Source: Ambulatory Visit | Attending: Internal Medicine | Admitting: Internal Medicine

## 2017-08-08 ENCOUNTER — Ambulatory Visit (INDEPENDENT_AMBULATORY_CARE_PROVIDER_SITE_OTHER): Payer: No Typology Code available for payment source | Admitting: Internal Medicine

## 2017-08-08 VITALS — BP 102/68 | HR 70 | Temp 98.3°F | Ht 61.25 in | Wt 104.1 lb

## 2017-08-08 DIAGNOSIS — Z01419 Encounter for gynecological examination (general) (routine) without abnormal findings: Secondary | ICD-10-CM | POA: Diagnosis not present

## 2017-08-08 DIAGNOSIS — Z79899 Other long term (current) drug therapy: Secondary | ICD-10-CM

## 2017-08-08 DIAGNOSIS — Z0000 Encounter for general adult medical examination without abnormal findings: Secondary | ICD-10-CM | POA: Diagnosis not present

## 2017-08-08 DIAGNOSIS — Z1211 Encounter for screening for malignant neoplasm of colon: Secondary | ICD-10-CM | POA: Diagnosis not present

## 2017-08-08 DIAGNOSIS — M2550 Pain in unspecified joint: Secondary | ICD-10-CM

## 2017-08-08 DIAGNOSIS — Z800 Family history of malignant neoplasm of digestive organs: Secondary | ICD-10-CM

## 2017-08-08 DIAGNOSIS — Z23 Encounter for immunization: Secondary | ICD-10-CM | POA: Diagnosis not present

## 2017-08-08 LAB — BASIC METABOLIC PANEL
BUN: 17 mg/dL (ref 6–23)
CO2: 27 mEq/L (ref 19–32)
Calcium: 9 mg/dL (ref 8.4–10.5)
Chloride: 106 mEq/L (ref 96–112)
Creatinine, Ser: 0.64 mg/dL (ref 0.40–1.20)
GFR: 103.73 mL/min (ref >60.00)
Glucose, Bld: 84 mg/dL (ref 70–99)
POTASSIUM: 3.7 meq/L (ref 3.5–5.1)
Sodium: 141 mEq/L (ref 135–145)

## 2017-08-08 LAB — CBC WITH DIFFERENTIAL/PLATELET
BASOS PCT: 0.6 % (ref 0.0–3.0)
Basophils Absolute: 0 10*3/uL (ref 0.0–0.1)
EOS PCT: 0.4 % (ref 0.0–5.0)
Eosinophils Absolute: 0 10*3/uL (ref 0.0–0.7)
HEMATOCRIT: 38.7 % (ref 36.0–46.0)
Hemoglobin: 12.9 g/dL (ref 12.0–15.0)
LYMPHS PCT: 24.2 % (ref 12.0–46.0)
Lymphs Abs: 1.2 10*3/uL (ref 0.7–4.0)
MCHC: 33.4 g/dL (ref 30.0–36.0)
MCV: 89.6 fl (ref 78.0–100.0)
MONOS PCT: 10.2 % (ref 3.0–12.0)
Monocytes Absolute: 0.5 10*3/uL (ref 0.1–1.0)
Neutro Abs: 3.2 10*3/uL (ref 1.4–7.7)
Neutrophils Relative %: 64.6 % (ref 43.0–77.0)
Platelets: 268 10*3/uL (ref 150.0–400.0)
RBC: 4.31 Mil/uL (ref 3.87–5.11)
RDW: 12.7 % (ref 11.5–15.5)
WBC: 5 10*3/uL (ref 4.0–10.5)

## 2017-08-08 LAB — LIPID PANEL
CHOL/HDL RATIO: 3
CHOLESTEROL: 179 mg/dL (ref 0–200)
HDL: 60.5 mg/dL (ref >39.00)
LDL Cholesterol: 110 mg/dL — ABNORMAL HIGH (ref 0–99)
NonHDL: 118.77
TRIGLYCERIDES: 42 mg/dL (ref 0.0–149.0)
VLDL: 8.4 mg/dL (ref 0.0–40.0)

## 2017-08-08 LAB — HEPATIC FUNCTION PANEL
ALT: 6 U/L (ref 0–35)
AST: 12 U/L (ref 0–37)
Albumin: 4.3 g/dL (ref 3.5–5.2)
Alkaline Phosphatase: 40 U/L (ref 39–117)
Bilirubin, Direct: 0.1 mg/dL (ref 0.0–0.3)
TOTAL PROTEIN: 6.2 g/dL (ref 6.0–8.3)
Total Bilirubin: 0.7 mg/dL (ref 0.2–1.2)

## 2017-08-08 LAB — HEMOGLOBIN A1C: HEMOGLOBIN A1C: 5.7 % (ref 4.6–6.5)

## 2017-08-08 LAB — TSH: TSH: 0.93 u[IU]/mL (ref 0.35–4.50)

## 2017-08-08 NOTE — Patient Instructions (Signed)
Will notify you  of labs when available. You will be contacted about  Colon cancer screening colonoscopy  Plan appt.    Try stretching exercises      Preventive Care 40-64 Years, Female Preventive care refers to lifestyle choices and visits with your health care provider that can promote health and wellness. What does preventive care include?  A yearly physical exam. This is also called an annual well check.  Dental exams once or twice a year.  Routine eye exams. Ask your health care provider how often you should have your eyes checked.  Personal lifestyle choices, including: ? Daily care of your teeth and gums. ? Regular physical activity. ? Eating a healthy diet. ? Avoiding tobacco and drug use. ? Limiting alcohol use. ? Practicing safe sex. ? Taking low-dose aspirin daily starting at age 56. ? Taking vitamin and mineral supplements as recommended by your health care provider. What happens during an annual well check? The services and screenings done by your health care provider during your annual well check will depend on your age, overall health, lifestyle risk factors, and family history of disease. Counseling Your health care provider may ask you questions about your:  Alcohol use.  Tobacco use.  Drug use.  Emotional well-being.  Home and relationship well-being.  Sexual activity.  Eating habits.  Work and work Statistician.  Method of birth control.  Menstrual cycle.  Pregnancy history.  Screening You may have the following tests or measurements:  Height, weight, and BMI.  Blood pressure.  Lipid and cholesterol levels. These may be checked every 5 years, or more frequently if you are over 46 years old.  Skin check.  Lung cancer screening. You may have this screening every year starting at age 73 if you have a 30-pack-year history of smoking and currently smoke or have quit within the past 15 years.  Fecal occult blood test (FOBT) of the stool.  You may have this test every year starting at age 11.  Flexible sigmoidoscopy or colonoscopy. You may have a sigmoidoscopy every 5 years or a colonoscopy every 10 years starting at age 42.  Hepatitis C blood test.  Hepatitis B blood test.  Sexually transmitted disease (STD) testing.  Diabetes screening. This is done by checking your blood sugar (glucose) after you have not eaten for a while (fasting). You may have this done every 1-3 years.  Mammogram. This may be done every 1-2 years. Talk to your health care provider about when you should start having regular mammograms. This may depend on whether you have a family history of breast cancer.  BRCA-related cancer screening. This may be done if you have a family history of breast, ovarian, tubal, or peritoneal cancers.  Pelvic exam and Pap test. This may be done every 3 years starting at age 50. Starting at age 67, this may be done every 5 years if you have a Pap test in combination with an HPV test.  Bone density scan. This is done to screen for osteoporosis. You may have this scan if you are at high risk for osteoporosis.  Discuss your test results, treatment options, and if necessary, the need for more tests with your health care provider. Vaccines Your health care provider may recommend certain vaccines, such as:  Influenza vaccine. This is recommended every year.  Tetanus, diphtheria, and acellular pertussis (Tdap, Td) vaccine. You may need a Td booster every 10 years.  Varicella vaccine. You may need this if you have not been  vaccinated.  Zoster vaccine. You may need this after age 61.  Measles, mumps, and rubella (MMR) vaccine. You may need at least one dose of MMR if you were born in 1957 or later. You may also need a second dose.  Pneumococcal 13-valent conjugate (PCV13) vaccine. You may need this if you have certain conditions and were not previously vaccinated.  Pneumococcal polysaccharide (PPSV23) vaccine. You may need  one or two doses if you smoke cigarettes or if you have certain conditions.  Meningococcal vaccine. You may need this if you have certain conditions.  Hepatitis A vaccine. You may need this if you have certain conditions or if you travel or work in places where you may be exposed to hepatitis A.  Hepatitis B vaccine. You may need this if you have certain conditions or if you travel or work in places where you may be exposed to hepatitis B.  Haemophilus influenzae type b (Hib) vaccine. You may need this if you have certain conditions.  Talk to your health care provider about which screenings and vaccines you need and how often you need them. This information is not intended to replace advice given to you by your health care provider. Make sure you discuss any questions you have with your health care provider. Document Released: 08/13/2015 Document Revised: 04/05/2016 Document Reviewed: 05/18/2015 Elsevier Interactive Patient Education  [employer].

## 2017-08-09 LAB — CYTOLOGY - PAP
ADEQUACY: ABSENT
DIAGNOSIS: NEGATIVE
HPV (WINDOPATH): NOT DETECTED

## 2017-08-13 NOTE — Progress Notes (Signed)
Tell patient PAP is normal. HPV high  risk is negative

## 2017-08-24 ENCOUNTER — Encounter: Payer: Self-pay | Admitting: Internal Medicine

## 2017-08-26 ENCOUNTER — Encounter (HOSPITAL_COMMUNITY): Payer: Self-pay | Admitting: Emergency Medicine

## 2017-08-26 ENCOUNTER — Emergency Department (HOSPITAL_COMMUNITY)
Admission: EM | Admit: 2017-08-26 | Discharge: 2017-08-26 | Disposition: A | Discharge status: Home / Self Care | Payer: No Typology Code available for payment source | Attending: Emergency Medicine | Admitting: Emergency Medicine

## 2017-08-26 ENCOUNTER — Emergency Department (HOSPITAL_COMMUNITY): Payer: No Typology Code available for payment source

## 2017-08-26 DIAGNOSIS — M79672 Pain in left foot: Secondary | ICD-10-CM | POA: Diagnosis not present

## 2017-08-26 DIAGNOSIS — Z79899 Other long term (current) drug therapy: Secondary | ICD-10-CM | POA: Insufficient documentation

## 2017-08-26 MED ORDER — MELOXICAM 15 MG PO TABS: 15.0000 mg | ORAL_TABLET | Freq: Every day | ORAL | 0 refills | Status: DC

## 2017-08-26 NOTE — ED Provider Notes (Signed)
[facility] DEPT Provider Note   CSN: [id number] Arrival date & time: 08/26/17  1153     History   Chief Complaint Chief Complaint  Patient presents with  . Foot Pain    HPI Stephanie Hoffman is a 52 y.o. female who presents with chief complaint of left foot pain.  She had sudden onset of pain starting this morning.  She has no pain at rest but pain when she bears weight.  She complains of pain along the MTP joint.  No history of gout, no fevers, no chills no recent injuries.  She denies pain before this morning.  She is non-smoker, no history of peripheral vascular disease.  He did not try any medications prior to arrival.  Has history of hip bursitis and uses a cane occasionally and was using it to ambulate on the foot today.  HPI  Past Medical History:  Diagnosis Date  . Gallstones    removed 2000  . Headache(784.0)   . Heart murmur   . Hyperlipidemia   . Pneumonia   . Varicose veins     Patient Active Problem List   Diagnosis Date Noted  . Acute cystitis with hematuria 11/27/2016  . RUQ abdominal pain 12/02/2014  . Loss of weight 12/02/2014  . Bowel habit changes 12/02/2014  . Family history of colon cancer 12/02/2014  . Arm pain, right/wrist.  11/17/2013  . Neck pain on right side 11/17/2013  . Hyperglycemia 09/24/2013  . Family hx of colon cancer 07/12/2012  . ARTHRITIS, LEFT SHOULDER 04/13/2010  . BREAST MASS, LEFT 02/04/2010  . HEADACHE 05/02/2007    Past Surgical History:  Procedure Laterality Date  . CESAREAN SECTION    . CHOLECYSTECTOMY     2000  . KNEE SURGERY     left torn ligament 2007  . SHOULDER SURGERY     right and left    OB History    No data available       Home Medications    Prior to Admission medications   Medication Sig Start Date End Date Taking? Authorizing Provider  BIOTIN 5000 PO Take 10,000 mg by mouth daily.    [provider]  diphenhydramine-acetaminophen (TYLENOL PM) 25-500  MG TABS Take 1 tablet by mouth at bedtime as needed (sleep).     [provider]  Magnesium 250 MG TABS Take 250 mg by mouth daily.    [provider]  meloxicam (MOBIC) 15 MG tablet Take 1 tablet (15 mg total) by mouth daily. 08/26/17   Margarita Mail, PA-C  OVER THE COUNTER MEDICATION Multiple Vitamin Gummy    [provider]  Potassium 99 MG TABS Take 99 mg by mouth daily.    [provider]  PROAIR HFA 108 (90 Base) MCG/ACT inhaler Inhale 1 puff into the lungs as needed for shortness of breath. 08/12/16   [provider]  Turmeric Curcumin 500 MG CAPS Take 2 capsules by mouth daily.    [provider]    Family History Family History  Problem Relation Age of Onset  . Arthritis Mother   . Diabetes Mother   . Hyperlipidemia Mother   . Heart disease Mother   . Colon cancer Mother 59  . Hypertension Father   . Prostate cancer Maternal Grandfather 80  . Stroke Paternal Grandfather   . Stomach cancer Maternal Grandmother 79  . Esophageal cancer Neg Hx   . Rectal cancer Neg Hx     Social History Social  History   Tobacco Use  . Smoking status: Never Smoker  . Smokeless tobacco: Never Used  Substance Use Topics  . Alcohol use: No  . Drug use: No     Allergies   Codeine and Penicillins   Review of Systems Review of Systems  Ten systems reviewed and are negative for acute change, except as noted in the HPI.  Physical Exam Updated Vital Signs BP 126/73 (BP Location: Left Arm)   Pulse 78   Temp 98.4 F (36.9 C) (Oral)   Resp 18   LMP 08/19/2017   SpO2 100%   Physical Exam  Physical Exam  Nursing note and vitals reviewed. Constitutional: She is oriented to person, place, and time. She appears well-developed and well-nourished. No distress.  HENT:  Head: Normocephalic and atraumatic.  Eyes: Conjunctivae normal and EOM are normal. Pupils are equal, round, and reactive to light. No scleral icterus.  Neck: Normal  range of motion.  Cardiovascular: Normal rate, regular rhythm and normal heart sounds.  Exam reveals no gallop and no friction rub.   No murmur heard. Pulmonary/Chest: Effort normal and breath sounds normal. No respiratory distress.  Abdominal: Soft. Bowel sounds are normal. She exhibits no distension and no mass. There is no tenderness. There is no guarding.  Neurological: She is alert and oriented to person, place, and time. Musculoskeletal: Patient tender palpation along the MTP joints.  No heat, redness, swelling, able to move toes.  No pain with flexion extension of the ankle, normal DP and PT pulses present. Skin: Skin is warm and dry. She is not diaphoretic.    ED Treatments / Results  Labs (all labs ordered are listed, but only abnormal results are displayed) Labs Reviewed - No data to display  EKG  EKG Interpretation None       Radiology Dg Foot Complete Left  Result Date: 08/26/2017 CLINICAL DATA:  Distal metatarsal pain, no known injury, initial encounter EXAM: LEFT FOOT - COMPLETE 3+ VIEW COMPARISON:  None. FINDINGS: There is no evidence of fracture or dislocation. There is no evidence of arthropathy or other focal bone abnormality. Soft tissues are unremarkable. IMPRESSION: No acute abnormality noted. Electronically Signed   By: Inez Catalina M.D.   On: 08/26/2017 14:31    Procedures Procedures (including critical care time)  Medications Ordered in ED Medications - No data to display   Initial Impression / Assessment and Plan / ED Course  I have reviewed the triage vital signs and the nursing notes.  Pertinent labs & imaging results that were available during my care of the patient were reviewed by me and considered in my medical decision making (see chart for details).     Patient X-Ray negative for obvious fracture or dislocation. Pain managed in ED. Pt advised to follow up with orthopedics if symptoms persist for possibility of missed fracture diagnosis.  Patient given brace while in ED, conservative therapy recommended and discussed. Patient will be dc home & is agreeable with above plan.   Final Clinical Impressions(s) / ED Diagnoses   Final diagnoses:  Foot pain, left    ED Discharge Orders        Ordered    meloxicam (MOBIC) 15 MG tablet  Daily     08/26/17 1534       Margarita Mail, PA-C 08/26/17 2359    Lacretia Leigh, MD 08/27/17 [phone]

## 2017-08-26 NOTE — ED Provider Notes (Signed)
Medical screening examination/treatment/procedure(s) were conducted as a shared visit with non-physician practitioner(s) and myself.  I personally evaluated the patient during the encounter.   EKG Interpretation None     52 year old female here with atraumatic left foot pain.  X-rays negative.  On exam she is tender along the MTPs.  Will treat with anti-inflammatories and return precautions given   Lacretia Leigh, MD 08/26/17 1531

## 2017-08-26 NOTE — ED Triage Notes (Signed)
Patient here from home with complaints of left foot pain. Reports "I woke up with pain". Denies fall. Ambulatory with cane.

## 2017-08-26 NOTE — Discharge Instructions (Signed)
Contact a health care provider if: Your pain does not get better after a few days of self-care. Your pain gets worse. You cannot stand on your foot. Get help right away if: Your foot is numb or tingling. Your foot or toes are swollen. Your foot or toes turn white or blue. You have warmth and redness along your foot. 

## 2017-11-09 ENCOUNTER — Ambulatory Visit: Payer: No Typology Code available for payment source | Admitting: Internal Medicine

## 2017-11-09 ENCOUNTER — Encounter: Payer: Self-pay | Admitting: Internal Medicine

## 2017-11-09 VITALS — BP 110/70 | HR 66 | Temp 98.0°F | Wt 107.4 lb

## 2017-11-09 DIAGNOSIS — M25552 Pain in left hip: Secondary | ICD-10-CM | POA: Diagnosis not present

## 2017-11-09 DIAGNOSIS — R269 Unspecified abnormalities of gait and mobility: Secondary | ICD-10-CM

## 2017-11-09 NOTE — Patient Instructions (Addendum)
I think you should see a  Sports   medicine or orthopedist   About the hip  .   Will  in referral and you will be contacted about this.     Hip Pain The hip is the joint between the upper legs and the lower pelvis. The bones, cartilage, tendons, and muscles of your hip joint support your body and allow you to move around. Hip pain can range from a minor ache to severe pain in one or both of your hips. The pain may be felt on the inside of the hip joint near the groin, or the outside near the buttocks and upper thigh. You may also have swelling or stiffness. Follow these instructions at home: Managing pain, stiffness, and swelling  If directed, apply ice to the injured area. ? Put ice in a plastic bag. ? Place a towel between your skin and the bag. ? Leave the ice on for 20 minutes, 2-3 times a day  Sleep with a pillow between your legs on your most comfortable side.  Avoid any activities that cause pain. General instructions  Take over-the-counter and prescription medicines only as told by your health care provider.  Do any exercises as told by your health care provider.  Record the following: ? How often you have hip pain. ? The location of your pain. ? What the pain feels like. ? What makes the pain worse.  Keep all follow-up visits as told by your health care provider. This is important. Contact a health care provider if:  You cannot put weight on your leg.  Your pain or swelling continues or gets worse after one week.  It gets harder to walk.  You have a fever. Get help right away if:  You fall.  You have a sudden increase in pain and swelling in your hip.  Your hip is red or swollen or very tender to touch. Summary  Hip pain can range from a minor ache to severe pain in one or both of your hips.  The pain may be felt on the inside of the hip joint near the groin, or the outside near the buttocks and upper thigh.  Avoid any activities that cause  pain.  Record how often you have hip pain, the location of the pain, what makes it worse and what it feels like. This information is not intended to replace advice given to you by your health care provider. Make sure you discuss any questions you have with your health care provider. Document Released: 01/04/2010 Document Revised: 06/19/2016 Document Reviewed: 06/19/2016 Elsevier Interactive Patient Education  [employer].

## 2017-11-09 NOTE — Progress Notes (Signed)
Chief Complaint  Patient presents with  . Hip Pain    Pt c/o hip pain, worse in left hip. Pt is having to sleep with a pillow between legs at night to help with pain. Pt states that the pain is making her legs feel weak and she has been having to use a cane recently. Pt has been under the care of Dr Ronnald Ramp for her back (slipped disc)    HPI: Stephanie Hoffman 52 y.o.   SDA    My chart appt  Cause of problem with hips left mostly    Hx of  Ms pain   Joint pains in past  But  Has been under eeval for left back sciatic disc  Sx  And noted increase in left hip groin pain   In past weeks     Increase sitting and driving and stiffer   driuving to [place] makes hard to get going and walking . [place] in [place]  Does better with cane   As leg feel catches at times but no falling   Pain 1Radiates down leg     Seeing dr Ronnald Ramp   Did mri of back  And has a bulging disc  And said   painfrom disc.  2 pain  around  Hip pain that is different  .?bursitis left   Affects sleep and gives out at times.   Dr Ronnald Ramp said not back so to see PCP about the hip pain   Has seen dr Noemi Chapel  A long  Time ago and they said he doesn't do hips.... Plan for the back:  Did do injection not felt to be and ot a surgical.   Does yoga  Tends to be flexible. Marland Kitchen   Has been out of work for back and ok to go back but feels needs to use cane to help with stability .  ROS: See pertinent positives and negatives per HPI. No fever     Past Medical History:  Diagnosis Date  . Gallstones    removed 2000  . Headache(784.0)   . Heart murmur   . Hyperlipidemia   . Pneumonia   . Varicose veins     Family History  Problem Relation Age of Onset  . Arthritis Mother   . Diabetes Mother   . Hyperlipidemia Mother   . Heart disease Mother   . Colon cancer Mother 8  . Hypertension Father   . Prostate cancer Maternal Grandfather 80  . Stroke Paternal Grandfather   . Stomach cancer Maternal Grandmother 79  . Esophageal cancer Neg  Hx   . Rectal cancer Neg Hx     Social History   Socioeconomic History  . Marital status: Married    Spouse name: Not on file  . Number of children: Not on file  . Years of education: Not on file  . Highest education level: Not on file  Occupational History  . Not on file  Social Needs  . Financial resource strain: Not on file  . Food insecurity:    Worry: Not on file    Inability: Not on file  . Transportation needs:    Medical: Not on file    Non-medical: Not on file  Tobacco Use  . Smoking status: Never Smoker  . Smokeless tobacco: Never Used  Substance and Sexual Activity  . Alcohol use: No  . Drug use: No  . Sexual activity: Yes    Comment: husband had vastectomy  Lifestyle  .  Physical activity:    Days per week: Not on file    Minutes per session: Not on file  . Stress: Not on file  Relationships  . Social connections:    Talks on phone: Not on file    Gets together: Not on file    Attends religious service: Not on file    Active member of club or organization: Not on file    Attends meetings of clubs or organizations: Not on file    Relationship status: Not on file  Other Topics Concern  . 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 BIOTIN 5000 PO Take 10,000 mg by mouth daily.    . diphenhydramine-acetaminophen (TYLENOL PM) 25-500 MG TABS Take 1 tablet by mouth at bedtime as needed (sleep).     . Magnesium 250 MG TABS Take 250 mg by mouth daily.    . meloxicam (MOBIC) 15 MG tablet Take 1 tablet (15 mg total) by mouth daily. 30 tablet 0  . OVER THE COUNTER MEDICATION Multiple Vitamin Gummy    . Potassium 99 MG TABS Take 99 mg by mouth daily.    . Turmeric Curcumin 500 MG CAPS Take 2 capsules by mouth daily.    Marland Kitchen PROAIR HFA 108 (90 Base) MCG/ACT  inhaler Inhale 1 puff into the lungs as needed for shortness of breath.     No facility-administered medications prior to visit.      EXAM:  BP 110/70 (BP Location: Right Arm, Patient Position: Sitting, Cuff Size: Normal)   Pulse 66   Temp 98 F (36.7 C) (Oral)   Wt 107 lb 6.4 oz (48.7 kg)   BMI 20.13 kg/m   Body mass index is 20.13 kg/m.  GENERAL: vitals reviewed and listed above, alert, oriented, appears well hydrated and in no acute distress HEENT: atraumatic, conjunctiva  clear, no obvious abnormalities on inspection of external nose and earsMS: moves all extremities without noticeable focal  Abnormality  But has abnormal gait favoring  Left leg   Limping   Independent   Toe heel ok   rom hip ok but  Pain on  external rotation   And flexion   . PSYCH: pleasant and cooperative, no obvious depression or anxiety  ASSESSMENT AND PLAN:  Discussed the following assessment and plan:  Left hip pain - Plan: Ambulatory referral to Sports Medicine  Unspecified abnormalities of gait and mobility Left hip pain problematic  In setting of some back  Pain  Sp evaluation and   To be managed by physical modalities    Disc   referral for hip evaluation     .     And referred to [facility]  She is trying to get her work site back to [employer] to avoid  Lots of driving  In interim  -Patient advised to return or notify health care team  if symptoms worsen ,persist or new concerns arise. Total visit 53mins > 50% spent counseling and coordinating care as indicated in above note and in instructions to patient .   Plan  Letter written Patient Instructions  I think you should see a  Sports   medicine or orthopedist   About the hip  .   Will  in referral and you will be contacted about this.     Hip Pain The hip is the joint between the upper legs and the lower pelvis. The bones, cartilage, tendons, and muscles of your hip joint support your body and allow you to move around. Hip pain can range from a minor  ache to severe pain in one or both of your hips. The pain may be felt on the inside of the hip joint near the groin, or the outside near the buttocks and upper thigh. You may also have swelling or stiffness. Follow these instructions at home: Managing pain, stiffness, and swelling  If directed, apply ice to the injured area. ? Put ice in a plastic bag. ? Place a towel between your skin and the bag. ? Leave the ice on for 20 minutes, 2-3 times a day  Sleep with a pillow between your legs on your most comfortable side.  Avoid any activities that cause pain. General instructions  Take over-the-counter and prescription medicines only as told by your health care provider.  Do any exercises as told by your health care provider.  Record the following: ? How often you have hip pain. ? The location of your pain. ? What the pain feels like. ? What makes the pain worse.  Keep all follow-up visits as told by your health care provider. This is important. Contact a health care provider if:  You cannot put weight on your leg.  Your pain or swelling continues or gets worse after one week.  It gets harder to walk.  You have a fever. Get help right away if:  You fall.  You have a sudden increase in pain and swelling in your hip.  Your hip is red or swollen or very tender to touch. Summary  Hip pain can range from a minor ache to severe pain in one or both of your hips.  The pain may be felt on the inside of the hip joint near the groin, or the outside near the buttocks and upper thigh.  Avoid any activities that cause pain.  Record how often you have hip pain, the location of the pain, what makes it worse and what it feels like. This information is not intended to replace advice given to you by your health care provider. Make sure you discuss any questions you have with your health care provider. Document Released: 01/04/2010 Document Revised: 06/19/2016 Document Reviewed:  06/19/2016 Elsevier Interactive Patient Education  2018 [place]. Lindora Alviar M.D.

## 2017-11-15 ENCOUNTER — Ambulatory Visit (INDEPENDENT_AMBULATORY_CARE_PROVIDER_SITE_OTHER)
Admission: RE | Admit: 2017-11-15 | Discharge: 2017-11-15 | Disposition: A | Discharge status: Home / Self Care | Payer: No Typology Code available for payment source | Source: Ambulatory Visit | Attending: Family Medicine | Admitting: Family Medicine

## 2017-11-15 ENCOUNTER — Encounter: Payer: Self-pay | Admitting: Family Medicine

## 2017-11-15 ENCOUNTER — Ambulatory Visit (INDEPENDENT_AMBULATORY_CARE_PROVIDER_SITE_OTHER): Payer: No Typology Code available for payment source | Admitting: Family Medicine

## 2017-11-15 VITALS — HR 72 | Temp 98.5°F | Ht 61.2 in | Wt 107.0 lb

## 2017-11-15 DIAGNOSIS — M25552 Pain in left hip: Secondary | ICD-10-CM

## 2017-11-15 NOTE — Progress Notes (Signed)
Stephanie Hoffman - 52 y.o. female MRN [id number]  Date of birth: 28-Mar-1966  SUBJECTIVE:  Including CC & ROS.  Chief Complaint  Patient presents with  . Left hip pain    Stephanie Hoffman is a 52 y.o. female that is presenting with left hip pain.  Pain is located over the lateral hip and radiates to her groin. Also has pain in her buttock.  She has been ambulating with a cane for two weeks to help with the pain. She has completed physical therapy previously for same symptoms. Pain is constant, denies certain movements that trigger the pain. She used to drive for long periods of time which would trigger mild to severe pain. Walking makes the pain worse as well. Denies any injury or surgery.  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    Review of Systems  Constitutional: Negative for fever.  HENT: Negative for congestion.   Respiratory: Negative for cough.   Cardiovascular: Negative for chest pain.  Gastrointestinal: Negative for abdominal pain.  Musculoskeletal: Positive for gait problem.  Skin: Positive for color change.  Neurological: Positive for weakness.  Hematological: Negative for adenopathy.  Psychiatric/Behavioral: Negative for agitation.    HISTORY: Past Medical, Surgical, Social, and Family History Reviewed & Updated per EMR.   Pertinent Historical Findings include:  Past Medical History:  Diagnosis Date  . Gallstones    removed 2000  . Headache(784.0)   . Heart murmur   . Hyperlipidemia   . Pneumonia   . Varicose veins     Past Surgical History:  Procedure Laterality Date  . CESAREAN SECTION    . CHOLECYSTECTOMY     2000  . KNEE SURGERY     left torn ligament 2007  . SHOULDER SURGERY     right and left    Allergies  Allergen Reactions  . Amoxicillin Diarrhea  . Codeine Itching  . Penicillins Rash    Has patient had a PCN reaction causing immediate rash, facial/tongue/throat swelling, SOB or lightheadedness with hypotension: No Has  patient had a PCN reaction causing severe rash involving mucus membranes or skin necrosis: No Has patient had a PCN reaction that required hospitalization: No Has patient had a PCN reaction occurring within the last 10 years: No If all of the above answers are "NO", then may proceed with Cephalosporin use.     Family History  Problem Relation Age of Onset  . Arthritis Mother   . Diabetes Mother   . Hyperlipidemia Mother   . Heart disease Mother   . Colon cancer Mother 15  . Hypertension Father   . Prostate cancer Maternal Grandfather 80  . Stroke Paternal Grandfather   . Stomach cancer Maternal Grandmother 79  . Esophageal cancer Neg Hx   . Rectal cancer Neg Hx      Social History   Socioeconomic History  . Marital status: Married    Spouse name: Not on file  . Number of children: Not on file  . Years of education: Not on file  . Highest education level: Not on file  Occupational History  . Not on file  Social Needs  . Financial resource strain: Not on file  . Food insecurity:    Worry: Not on file    Inability: Not on file  . Transportation needs:    Medical: Not on file    Non-medical: Not on file  Tobacco Use  . Smoking status: Never Smoker  . Smokeless tobacco: Never  Used  Substance and Sexual Activity  . Alcohol use: No  . Drug use: No  . Sexual activity: Yes    Comment: husband had vastectomy  Lifestyle  . Physical activity:    Days per week: Not on file    Minutes per session: Not on file  . Stress: Not on file  Relationships  . Social connections:    Talks on phone: Not on file    Gets together: Not on file    Attends religious service: Not on file    Active member of club or organization: Not on file    Attends meetings of clubs or organizations: Not on file    Relationship status: Not on file  . Intimate partner violence:    Fear of current or ex partner: Not on file    Emotionally abused: Not on file    Physically abused: Not on file     Forced sexual activity: Not on file  Other Topics Concern  . 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   Married   Regular exercise- yes   HH of 3 no pet s now    not ets     PHYSICAL EXAM:  VS: Pulse 72   Temp 98.5 F (36.9 C) (Oral)   Ht 5' 1.2" (1.554 m)   Wt 107 lb (48.5 kg)   SpO2 100%   BMI 20.09 kg/m  Physical Exam Gen: NAD, alert, cooperative with exam, well-appearing ENT: normal lips, normal nasal mucosa,  Eye: normal EOM, normal conjunctiva and lids CV:  no edema, +2 pedal pulses   Resp: no accessory muscle use, non-labored,  Skin: no rashes, no areas of induration  Neuro: normal tone, normal sensation to touch Psych:  normal insight, alert and oriented MSK:  Left hip:  TTP of the GT, piriformis, vastus lateralis and rectus femoris.  Normal IR and ER  Normal strength to resistance with hip flexion  Normal knee flexion and extension  normal plantar and dorsiflexion Significant weakness with hip abduction Normal FADIR and FABER  Ambulating with single point cane.  Neurovascularly intact    ASSESSMENT & PLAN:   I spent 25 minutes with this patient, greater than 50% was face-to-face time counseling regarding the below diagnosis.   Left hip pain Exam not consistent with one specific problem. Possible for intra-articular disease with groin pain. Possible for GT bursitis or sciatica.  - counseled on the different treatment options and diagnostic studies  - counseled on HEP  - xray today  - work note provided.  - can follow up for intra-articular hip injection for diagnostic purposes. If not helpful would consider more glute med syndrome and GT bursitis vs sciatica

## 2017-11-15 NOTE — Patient Instructions (Signed)
Please schedule a follow up with me at my [place] location in order to have a hip injection  Please try the exercises. These are the key to helping with your pain We will call you with the results from today.   [place], [place]

## 2017-11-16 NOTE — Assessment & Plan Note (Addendum)
Exam not consistent with one specific problem. Possible for intra-articular disease with groin pain. Possible for GT bursitis or sciatica.  - counseled on the different treatment options and diagnostic studies  - counseled on HEP  - xray today  - work note provided.  - can follow up for intra-articular hip injection for diagnostic purposes. If not helpful would consider more glute med syndrome and GT bursitis vs sciatica

## 2017-11-19 ENCOUNTER — Ambulatory Visit: Payer: No Typology Code available for payment source | Admitting: Family Medicine

## 2017-11-19 ENCOUNTER — Encounter: Payer: Self-pay | Admitting: Family Medicine

## 2017-11-19 ENCOUNTER — Ambulatory Visit: Payer: Self-pay

## 2017-11-19 VITALS — BP 122/68 | HR 70 | Temp 98.4°F | Ht 61.2 in | Wt 107.0 lb

## 2017-11-19 DIAGNOSIS — M25552 Pain in left hip: Secondary | ICD-10-CM

## 2017-11-19 MED ORDER — DICLOFENAC SODIUM 2 % TD SOLN: 1.0000 "application " | Freq: Two times a day (BID) | TRANSDERMAL | 3 refills | Status: DC

## 2017-11-19 NOTE — Progress Notes (Signed)
Stephanie Hoffman - 52 y.o. female MRN [id number]  Date of birth: 1965-10-14  SUBJECTIVE:  Including CC & ROS.  Chief Complaint  Patient presents with  . Hip injection    Stephanie Hoffman is a 52 y.o. female that is here today for a left hip injection.  Denies any changes in her history.   Independent review of the left hip x-ray from 4/22 shows no degenerative changes.   Review of Systems  HISTORY: Past Medical, Surgical, Social, and Family History Reviewed & Updated per EMR.   Pertinent Historical Findings include:  Past Medical History:  Diagnosis Date  . Gallstones    removed 2000  . Headache(784.0)   . Heart murmur   . Hyperlipidemia   . Pneumonia   . Varicose veins     Past Surgical History:  Procedure Laterality Date  . CESAREAN SECTION    . CHOLECYSTECTOMY     2000  . KNEE SURGERY     left torn ligament 2007  . SHOULDER SURGERY     right and left    Allergies  Allergen Reactions  . Amoxicillin Diarrhea  . Codeine Itching  . Penicillins Rash    Has patient had a PCN reaction causing immediate rash, facial/tongue/throat swelling, SOB or lightheadedness with hypotension: No Has patient had a PCN reaction causing severe rash involving mucus membranes or skin necrosis: No Has patient had a PCN reaction that required hospitalization: No Has patient had a PCN reaction occurring within the last 10 years: No If all of the above answers are "NO", then may proceed with Cephalosporin use.     Family History  Problem Relation Age of Onset  . Arthritis Mother   . Diabetes Mother   . Hyperlipidemia Mother   . Heart disease Mother   . Colon cancer Mother 9  . Hypertension Father   . Prostate cancer Maternal Grandfather 80  . Stroke Paternal Grandfather   . Stomach cancer Maternal Grandmother 79  . Esophageal cancer Neg Hx   . Rectal cancer Neg Hx      Social History   Socioeconomic History  . Marital status: Married    Spouse name: Not on file  .  Number of children: Not on file  . Years of education: Not on file  . Highest education level: Not on file  Occupational History  . Not on file  Social Needs  . Financial resource strain: Not on file  . Food insecurity:    Worry: Not on file    Inability: Not on file  . Transportation needs:    Medical: Not on file    Non-medical: Not on file  Tobacco Use  . Smoking status: Never Smoker  . Smokeless tobacco: Never Used  Substance and Sexual Activity  . Alcohol use: No  . Drug use: No  . Sexual activity: Yes    Comment: husband had vastectomy  Lifestyle  . Physical activity:    Days per week: Not on file    Minutes per session: Not on file  . Stress: Not on file  Relationships  . Social connections:    Talks on phone: Not on file    Gets together: Not on file    Attends religious service: Not on file    Active member of club or organization: Not on file    Attends meetings of clubs or organizations: Not on file    Relationship status: Not on file  . Intimate partner violence:  Fear of current or ex partner: Not on file    Emotionally abused: Not on file    Physically abused: Not on file    Forced sexual activity: Not on file  Other Topics Concern  . 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   Married   Regular exercise- yes   HH of 3 no pet s now    not ets     PHYSICAL EXAM:  VS: BP 122/68 (BP Location: Left Arm, Patient Position: Sitting, Cuff Size: Normal)   Pulse 70   Temp 98.4 F (36.9 C) (Oral)   Ht 5' 1.2" (1.554 m)   Wt 107 lb (48.5 kg)   SpO2 99%   BMI 20.09 kg/m  Physical Exam Gen: NAD, alert, cooperative with exam, well-appearing   Aspiration/Injection Procedure Note Stephanie Hoffman 06-09-1966  Procedure: Injection Indications: left hip pain   Procedure Details Consent: Risks of procedure as well as the alternatives and risks of each were explained to the  (patient/caregiver).  Consent for procedure obtained. Time Out: Verified patient identification, verified procedure, site/side was marked, verified correct patient position, special equipment/implants available, medications/allergies/relevent history reviewed, required imaging and test results available.  Performed.  The area was cleaned with iodine and alcohol swabs.    The left hip intra-articular joint was injected. 8 cc's of 1% lidocaine without epi was used to anesthetize the tract and joint. Then syringe was switched off and a mixture using 1 cc's of 40 Depomedrol and 4 cc's of 1% lidocaine with a 22 1 3" needle.  Ultrasound was used. Images were obtained in Long views showing the injection.    A sterile dressing was applied.  Patient did tolerate procedure well.       ASSESSMENT & PLAN:   Left hip pain Intra-articular joint injection today. Will monitor for improvement.  Provided Pennsaid for pain over the GT. Likely has a component of Glute med vs Sciatica.  If no improvement consider GT injection vs gabapentin for sciatica. Could try PT

## 2017-11-19 NOTE — Assessment & Plan Note (Signed)
Intra-articular joint injection today. Will monitor for improvement.  Provided Pennsaid for pain over the GT. Likely has a component of Glute med vs Sciatica.  If no improvement consider GT injection vs gabapentin for sciatica. Could try PT

## 2017-11-19 NOTE — Patient Instructions (Signed)
Please let me know if you get improvement with this injection  Please try to rub the Pennsaid on the outside of your hip.  Please try the exercises  Please follow up with me in 3-4 weeks if your pain hasn't improved.

## 2018-01-03 ENCOUNTER — Telehealth: Payer: Self-pay | Admitting: *Deleted

## 2018-01-03 NOTE — Telephone Encounter (Signed)
Spoke with patient regarding her work restrictions. She is requesting she needs a letter to state she can work 8 hrs vs 4 hrs for her job. Patient aware will have to discuss further with Dr. Raeford Razor. Will return her call when I have further information.    Copied from [place] [id number]. Topic: General - Other >> Jan 03, 2018 11:36 AM Yvette Rack wrote: Reason for CRM: Pt states she was put on restrictions by Dr. Raeford Razor and it is urgent that his nurse call her back. Cb# [phone]

## 2018-01-08 ENCOUNTER — Encounter: Payer: Self-pay | Admitting: Family Medicine

## 2018-01-09 NOTE — Telephone Encounter (Signed)
Letter updated, sent per her request

## 2018-02-11 ENCOUNTER — Encounter: Payer: Self-pay | Admitting: Internal Medicine

## 2018-02-11 DIAGNOSIS — R21 Rash and other nonspecific skin eruption: Secondary | ICD-10-CM

## 2018-02-11 DIAGNOSIS — T783XXA Angioneurotic edema, initial encounter: Secondary | ICD-10-CM

## 2018-02-12 NOTE — Telephone Encounter (Signed)
sorry you are having so many problems .  You should be seen by provider  if have swollen lips   And seek EDcare if any  Shortness of breath or swallowing difficulties   Can  take benadryl if y ou think allergic  In the interim   [facility]  Are both fine for this type of problem

## 2018-02-12 NOTE — Telephone Encounter (Signed)
I spoke with pt, lips are not swollen now, did agree to follow advice from Dr. Regis Bill. She would like the referral for allergy testing, which I did put order in Epic.

## 2018-03-25 ENCOUNTER — Encounter: Payer: Self-pay | Admitting: Allergy & Immunology

## 2018-03-25 ENCOUNTER — Ambulatory Visit: Payer: No Typology Code available for payment source | Admitting: Allergy & Immunology

## 2018-03-25 VITALS — BP 112/68 | HR 69 | Temp 98.1°F | Resp 16 | Ht 61.0 in | Wt 111.2 lb

## 2018-03-25 DIAGNOSIS — J310 Chronic rhinitis: Secondary | ICD-10-CM

## 2018-03-25 DIAGNOSIS — T781XXD Other adverse food reactions, not elsewhere classified, subsequent encounter: Secondary | ICD-10-CM | POA: Diagnosis not present

## 2018-03-25 DIAGNOSIS — T781XXA Other adverse food reactions, not elsewhere classified, initial encounter: Secondary | ICD-10-CM | POA: Insufficient documentation

## 2018-03-25 MED ORDER — EPINEPHRINE 0.3 MG/0.3ML IJ SOAJ: 0.3000 mg | Freq: Once | INTRAMUSCULAR | 2 refills | Status: AC

## 2018-03-25 NOTE — Patient Instructions (Addendum)
1. Adverse food reaction, subsequent encounter - Testing was negative to [facility] , Shrimp, Crab, Lobster, Oyster and [employer] - Training for epinephrine auto-injectors provided: Wynona Luna - Instead of doing blood work, wait for the [place] to arrive and then introduce shellfish individually at home. - Call us with an update.   2. Chronic rhinitis - Testing today showed: negative to the entire panel - Start taking: Zyrtec (cetirizine) 10mg  tablet once daily as needed (especially before going outdoors) - You can use an extra dose of the antihistamine, if needed, for breakthrough symptoms.  - Consider nasal saline rinses 1-2 times daily to remove allergens from the nasal cavities as well as help with mucous clearance (this is especially helpful to do before the nasal sprays are given)  3. Return in about 3 months (around 06/25/2018).   Please inform us of any Emergency Department visits, hospitalizations, or changes in symptoms. Call us before going to the ED for breathing or allergy symptoms since we might be able to fit you in for a sick visit. Feel free to contact us anytime with any questions, problems, or concerns.  It was a pleasure to meet you today!  Websites that have reliable patient information: 1. American Academy of Asthma, Allergy, and Immunology: www.aaaai.org 2. Food Allergy Research and Education (FARE): foodallergy.org 3. Mothers of Asthmatics: http://www.asthmacommunitynetwork.org 4. American College of Allergy, Asthma, and Immunology: MonthlyElectricBill.co.uk   Make sure you are registered to vote! If you have moved or changed any of your contact information, you will need to get this updated before voting!

## 2018-03-25 NOTE — Progress Notes (Signed)
NEW PATIENT  Date of Service/Encounter:  03/25/18  Referring provider: Burnis Medin, MD   Assessment:   Adverse food reaction (shellfish) - with negative testing  Non-allergic rhinitis    [name] is a delightful 52 y.o. female presenting for an evaluation of a possible shellfish allergy. Her history is not very convincing, with symptoms isolated only to the skin and not consistent with urticaria. In any case, testing today was negative to shellfish. There is excellent negative predictive value to testing for food allergies, therefore I am optimistic that she is not allergic to it. I did offer confirmatory blood testing, but Ms. Stange is not a fan of needles. Therefore, we compromised on awaiting the arrival of an [place] before doing an in home challenge with individual shellfish. She is in agreement with the plan. In addition, environmental allergy testing was completely negative, therefore I recommended using cetirizine as needed. We deferred on intradermal testing since symptoms were not overly severe enough to warrant initiation of allergen immunotherapy.    Plan/Recommendations:   1. Adverse food reaction (shellfish) - Testing was negative to [facility] [facility] , Shrimp, Crab, Lobster, Oyster and [employer] - Training for epinephrine auto-injectors provided: Wynona Luna - Instead of doing blood work, wait for the [place] to arrive and then introduce shellfish individually at home. - Call us with an update.   2. Chronic rhinitis - Testing today showed: negative to the entire panel - Start taking: Zyrtec (cetirizine) 10mg  tablet once daily as needed (especially before going outdoors) - You can use an extra dose of the antihistamine, if needed, for breakthrough symptoms.  - Consider nasal saline rinses 1-2 times daily to remove allergens from the nasal cavities as well as help with mucous clearance (this is especially helpful to do before the nasal sprays are given)  3. Return in about  3 months (around 06/25/2018).  Subjective:   Stephanie Hoffman is a 52 y.o. female presenting today for evaluation of  Chief Complaint  Patient presents with  . Allergic Reaction    shrimp    Stephanie Hoffman has a history of the following: Patient Active Problem List   Diagnosis Date Noted  . Non-allergic rhinitis 03/25/2018  . Adverse food reaction 03/25/2018  . Left hip pain 11/09/2017  . Unspecified abnormalities of gait and mobility 11/09/2017  . Acute cystitis with hematuria 11/27/2016  . RUQ abdominal pain 12/02/2014  . Loss of weight 12/02/2014  . Bowel habit changes 12/02/2014  . Family history of colon cancer 12/02/2014  . Arm pain, right/wrist.  11/17/2013  . Neck pain on right side 11/17/2013  . Hyperglycemia 09/24/2013  . Family hx of colon cancer 07/12/2012  . ARTHRITIS, LEFT SHOULDER 04/13/2010  . BREAST MASS, LEFT 02/04/2010  . HEADACHE 05/02/2007    History obtained from: chart review and patient.  Berlinda Last was referred by Burnis Medin, MD.     Stephanie Hoffman is a 52 y.o. female presenting for an evaluation ofshellfish allergy. She first had an episode on thew weekend of the 4th. She went to Urgent Care and she thought that it was shingles. The rash was pruritic. She ate the shrimp around 6pm and then her husband took her for a ride around [place]. Then around 7:30 her neck started itching and her torso started itching. Then the weekend of the 12th she had another reaction when she ate some seafood two nights in row. She treated this with calamine lotion as well as Benadryl. She otherwise  tolerates the rest of the major food allergens without adverse events..   [name] does have seasonal rhinitis when she is outside working. She has been on Sudafed with improvement but she tries not to use it. She does not use a nose spray. She has never been allergy tested in the past.   Otherwise, there is no history of other atopic diseases, including asthma,  drug allergies, stinging insect allergies, or urticaria. There is no significant infectious history. Vaccinations are up to date.    Past Medical History: Patient Active Problem List   Diagnosis Date Noted  . Non-allergic rhinitis 03/25/2018  . Adverse food reaction 03/25/2018  . Left hip pain 11/09/2017  . Unspecified abnormalities of gait and mobility 11/09/2017  . Acute cystitis with hematuria 11/27/2016  . RUQ abdominal pain 12/02/2014  . Loss of weight 12/02/2014  . Bowel habit changes 12/02/2014  . Family history of colon cancer 12/02/2014  . Arm pain, right/wrist.  11/17/2013  . Neck pain on right side 11/17/2013  . Hyperglycemia 09/24/2013  . Family hx of colon cancer 07/12/2012  . ARTHRITIS, LEFT SHOULDER 04/13/2010  . BREAST MASS, LEFT 02/04/2010  . HEADACHE 05/02/2007    Medication List:  Allergies as of 03/25/2018      Reactions   Amoxicillin Diarrhea   Coconut Oil Itching   Codeine Itching   Magnesium-containing Compounds Itching   Penicillins Rash   Has patient had a PCN reaction causing immediate rash, facial/tongue/throat swelling, SOB or lightheadedness with hypotension: No Has patient had a PCN reaction causing severe rash involving mucus membranes or skin necrosis: No Has patient had a PCN reaction that required hospitalization: No Has patient had a PCN reaction occurring within the last 10 years: No If all of the above answers are "NO", then may proceed with Cephalosporin use.   Vitamin E-safflower Oil Itching      Medication List        Accurate as of 03/25/18 10:46 PM. Always use your most recent med list.          BIOTIN 5000 PO Take 10,000 mg by mouth daily.   diphenhydramine-acetaminophen 25-500 MG Tabs tablet Commonly known as:  TYLENOL PM Take 1 tablet by mouth at bedtime as needed (sleep).   EPINEPHrine 0.3 mg/0.3 mL Soaj injection Commonly known as:  EPI-PEN Inject 0.3 mLs (0.3 mg total) into the muscle once for 1 dose.   GINGER  ROOT PO Take 1 capsule by mouth 1 day or 1 dose.   meloxicam 15 MG tablet Commonly known as:  MOBIC Take 1 tablet (15 mg total) by mouth daily.   OVER THE COUNTER MEDICATION Multiple Vitamin Gummy   Potassium 99 MG Tabs Take 99 mg by mouth daily.   Turmeric Curcumin 500 MG Caps Take 2 capsules by mouth daily.       Birth History: non-contributory.   Developmental History: non-contributory.   Past Surgical History: Past Surgical History:  Procedure Laterality Date  . CESAREAN SECTION    . CHOLECYSTECTOMY     2000  . CHOLECYSTECTOMY N/A 2007  . KNEE SURGERY     left torn ligament 2007  . SHOULDER SURGERY     right and left  . WISDOM TOOTH EXTRACTION Bilateral 1997     Family History: Family History  Problem Relation Age of Onset  . Arthritis Mother   . Diabetes Mother   . Hyperlipidemia Mother   . Heart disease Mother   . Colon cancer Mother 81  .  Hypertension Father   . Prostate cancer Maternal Grandfather 80  . Stroke Paternal Grandfather   . Stomach cancer Maternal Grandmother 79  . Esophageal cancer Neg Hx   . Rectal cancer Neg Hx      Social History: Chloey lives at home with her husband. They live in a house that is 33 years old. They have laminate flooring throughout the home. They have gas heating and central cooling. There are no animals inside or outside of the home. There are no dust mite coverings on the bedding. There is no tobacco exposure in the home. She currently works for the [employer]. Her husband works with city transit.      Review of Systems: a 14-point review of systems is pertinent for what is mentioned in HPI.  Otherwise, all other systems were negative. Constitutional: negative other than that listed in the HPI Eyes: negative other than that listed in the HPI Ears, nose, mouth, throat, and face: negative other than that listed in the HPI Respiratory: negative other than that listed in the HPI Cardiovascular: negative other  than that listed in the HPI Gastrointestinal: negative other than that listed in the HPI Genitourinary: negative other than that listed in the HPI Integument: negative other than that listed in the HPI Hematologic: negative other than that listed in the HPI Musculoskeletal: negative other than that listed in the HPI Neurological: negative other than that listed in the HPI Allergy/Immunologic: negative other than that listed in the HPI    Objective:   Blood pressure 112/68, pulse 69, temperature 98.1 F (36.7 C), temperature source Oral, resp. rate 16, height 5\' 1"  (1.549 m), weight 111 lb 3.2 oz (50.4 kg), SpO2 99 %. Body mass index is 21.01 kg/m.   Physical Exam:  General: Alert, interactive, in no acute distress. Pleasant talkative female.  Eyes: No conjunctival injection bilaterally, no discharge on the right, no discharge on the left and no Horner-Trantas dots present. PERRL bilaterally. EOMI without pain. No photophobia.  Ears: Right TM pearly gray with normal light reflex, Left TM pearly gray with normal light reflex, Right TM intact without perforation and Left TM intact without perforation.  Nose/Throat: External nose within normal limits and septum midline. Turbinates edematous with clear discharge. Posterior oropharynx mildly erythematous without cobblestoning in the posterior oropharynx. Tonsils 2+ without exudates.  Tongue without thrush. Neck: Supple without thyromegaly. Trachea midline. Adenopathy: shoddy bilateral anterior cervical lymphadenopathy and no enlarged lymph nodes appreciated in the occipital, axillary, epitrochlear, inguinal, or popliteal regions. Lungs: Clear to auscultation without wheezing, rhonchi or rales. No increased work of breathing. CV: Normal S1/S2. No murmurs. Capillary refill <2 seconds.  Abdomen: Nondistended, nontender. No guarding or rebound tenderness. Bowel sounds present in all fields and hypoactive  Skin: Warm and dry, without lesions or  rashes. Extremities:  No clubbing, cyanosis or edema. Neuro:   Grossly intact. No focal deficits appreciated. Responsive to questions.   Diagnostic studies:   Allergy Studies:   Indoor/Outdoor Percutaneous Adult Environmental Panel: negative to the entire panel with adequate controls.  Selected Food Panel: negative to Shellfish Mix , Shrimp, Crab, Lobster, Oyster and Scallop   Allergy testing results were read and interpreted by myself, documented by clinical staff.       Salvatore Marvel, MD [facility] [place] [facility]

## 2018-07-25 LAB — HM MAMMOGRAPHY

## 2018-08-14 ENCOUNTER — Encounter: Payer: Self-pay | Admitting: Internal Medicine

## 2018-08-17 NOTE — Progress Notes (Signed)
Chief Complaint  Patient presents with  . Annual Exam    no new concerns    HPI: Patient  Stephanie Hoffman  53 y.o. comes in today for Preventive Health Care visit   Doing better since last visit  New job location and now doing regular  Activities getting up and stretching during work day  And back and joints seem better .  And controlled    Health Maintenance  Topic Date Due  . HIV Screening  12/29/1980  . INFLUENZA VACCINE  02/28/2018  . MAMMOGRAM  07/26/2019  . COLONOSCOPY  01/27/2020  . PAP SMEAR-Modifier  08/08/2020  . TETANUS/TDAP  09/25/2023   Health Maintenance Review LIFESTYLE:  Exercise:   Walks every hours.  Tobacco/ETS: no Alcohol:   no Sugar beverages: grape juice  ocass  Sleep: 6  Hours   But no longer having to get up at 3 am to get to work  Drug use: no HH of  2 no pets  Work:  40 hours  No loinger back and forth to [employer] last year  Took 6 mos.     ROS:  GEN/ HEENT: No fever, significant weight changes sweats headaches vision problems hearing changes, CV/ PULM; No chest pain shortness of breath cough, syncope,edema  change in exercise tolerance. GI /GU: No adominal pain, vomiting, change in bowel habits. No blood in the stool. No significant GU symptoms. SKIN/HEME: ,no acute skin rashes suspicious lesions or bleeding. No lymphadenopathy, nodules, masses.  NEURO/ PSYCH:  No neurologic signs such as weakness numbness. No depression anxiety. IMM/ Allergy: No unusual infections.  Allergy .   REST of 12 system review negative except as per HPI   Past Medical History:  Diagnosis Date  . Angio-edema   . Gallstones    removed 2000  . Headache(784.0)   . Heart murmur   . Hyperlipidemia   . Pneumonia   . Recurrent upper respiratory infection (URI)   . Urticaria   . Varicose veins     Past Surgical History:  Procedure Laterality Date  . CESAREAN SECTION    . CHOLECYSTECTOMY     2000  . CHOLECYSTECTOMY N/A 2007  . KNEE SURGERY     left torn ligament 2007  . SHOULDER SURGERY     right and left  . WISDOM TOOTH EXTRACTION Bilateral 1997    Family History  Problem Relation Age of Onset  . Arthritis Mother   . Diabetes Mother   . Hyperlipidemia Mother   . Heart disease Mother   . Colon cancer Mother 23  . Hypertension Father   . Prostate cancer Maternal Grandfather 80  . Stroke Paternal Grandfather   . Stomach cancer Maternal Grandmother 79  . Esophageal cancer Neg Hx   . Rectal cancer Neg Hx     Social History   Socioeconomic History  . Marital status: Married    Spouse name: Not on file  . Number of children: Not on file  . Years of education: Not on file  . Highest education level: Not on file  Occupational History  . Not on file  Social Needs  . Financial resource strain: Not on file  . Food insecurity:    Worry: Not on file    Inability: Not on file  . Transportation needs:    Medical: Not on file    Non-medical: Not on file  Tobacco Use  . Smoking status: Never Smoker  . Smokeless tobacco: Never Used  Substance  and Sexual Activity  . Alcohol use: No  . Drug use: No  . Sexual activity: Yes    Comment: husband had vastectomy  Lifestyle  . Physical activity:    Days per week: Not on file    Minutes per session: Not on file  . Stress: Not on file  Relationships  . Social connections:    Talks on phone: Not on file    Gets together: Not on file    Attends religious service: Not on file    Active member of club or organization: Not on file    Attends meetings of clubs or organizations: Not on file    Relationship status: Not on file  Other Topics Concern  . 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 BIOTIN 5000 PO Take 10,000 mg by mouth daily.    .  diphenhydramine-acetaminophen (TYLENOL PM) 25-500 MG TABS Take 1 tablet by mouth at bedtime as needed (sleep).     . Ginger, Zingiber officinalis, (GINGER ROOT PO) Take 1 capsule by mouth 1 day or 1 dose.    Marland Kitchen OVER THE COUNTER MEDICATION Multiple Vitamin Gummy    . Potassium 99 MG TABS Take 99 mg by mouth daily.    Marland Kitchen UNABLE TO FIND Med Name: Amberen 2 capsules daily (hot flashes)    . meloxicam (MOBIC) 15 MG tablet Take 1 tablet (15 mg total) by mouth daily. (Patient not taking: Reported on 08/19/2018) 30 tablet 0  . Turmeric Curcumin 500 MG CAPS Take 2 capsules by mouth daily.     No facility-administered medications prior to visit.      EXAM:  BP 106/68 (BP Location: Right Arm, Patient Position: Sitting, Cuff Size: Normal)   Pulse 70   Temp 98.4 F (36.9 C) (Oral)   Ht 5' 1.5" (1.562 m)   Wt 110 lb (49.9 kg)   BMI 20.45 kg/m   Body mass index is 20.45 kg/m. Wt Readings from Last 3 Encounters:  08/19/18 110 lb (49.9 kg)  03/25/18 111 lb 3.2 oz (50.4 kg)  11/19/17 107 lb (48.5 kg)    Physical Exam: Vital signs reviewed CNO:BSJG is a well-developed well-nourished slender but healthy  alert cooperative    who appearsr stated age in no acute distress.  HEENT: normocephalic atraumatic , Eyes: PERRL EOM's full, conjunctiva clear, Nares: paten,t no deformity discharge or tenderness., Ears: no deformity EAC's clear TMs with normal landmarks. Mouth: clear OP, no lesions, edema.  Moist mucous membranes. Dentition in adequate repair. NECK: supple without masses, thyromegaly or bruits. CHEST/PULM:  Clear to auscultation and percussion breath sounds equal no wheeze , rales or rhonchi. No chest wall deformities or tenderness. Breast: normal by inspection . No dimpling, discharge, masses, tenderness or discharge . CV: PMI is nondisplaced, S1 S2 no gallops, murmurs, rubs. Peripheral pulses are full without delay.No JVD .  ABDOMEN: Bowel sounds normal nontender  No guard or rebound, no hepato  splenomegal no CVA tenderness.  No hernia. Extremtities:  No clubbing cyanosis or edema, no acute joint swelling or redness no focal atrophy NEURO:  Oriented x3, cranial nerves 3-12 appear to be intact, no obvious focal weakness,gait within normal limits no abnormal reflexes or asymmetrical SKIN: No  acute rashes normal turgor, color, no bruising or petechiae. PSYCH: Oriented, good eye contact, no obvious depression anxiety, cognition and judgment appear normal. LN: no cervical axillary inguinal adenopathy  Lab Results  Component Value Date   WBC 5.0 08/08/2017   HGB 12.9 08/08/2017   HCT 38.7 08/08/2017   PLT 268.0 08/08/2017   GLUCOSE 84 08/08/2017   CHOL 179 08/08/2017   TRIG 42.0 08/08/2017   HDL 60.50 08/08/2017   LDLDIRECT 136.9 08/27/2013   LDLCALC 110 (H) 08/08/2017   ALT 6 08/08/2017   AST 12 08/08/2017   NA 141 08/08/2017   K 3.7 08/08/2017   CL 106 08/08/2017   CREATININE 0.64 08/08/2017   BUN 17 08/08/2017   CO2 27 08/08/2017   TSH 0.93 08/08/2017   HGBA1C 5.7 08/08/2017   MICROALBUR 1.0 07/17/2016    BP Readings from Last 3 Encounters:  08/19/18 106/68  03/25/18 112/68  11/19/17 122/68    Lab plan  ASSESSMENT AND PLAN:  Discussed the following assessment and plan:  Visit for preventive health examination - Plan: Basic metabolic panel, CBC with Differential/Platelet, Hepatic function panel, Lipid panel  Medication management - Plan: Basic metabolic panel, CBC with Differential/Platelet, Hepatic function panel, Lipid panel  Multiple joint pain - Plan: Basic metabolic panel, CBC with Differential/Platelet, Hepatic function panel, Lipid panel  High serum low-density lipoprotein (LDL) - Plan: Basic metabolic panel, CBC with Differential/Platelet, Hepatic function panel, Lipid panel  Screening for HIV (human immunodeficiency virus) - Plan: HIV antibody (with reflex)  Need for influenza vaccination - Plan: Flu Vaccine QUAD 6+ mos PF IM (Fluarix Quad  PF) Doing much better prob because of  Avoiding commute and activity routine at work  And sleep cycling more appropriate  For circadian rhythm.  Patient Care Team: Celester Morgan, Standley Brooking, MD as PCP - Loni Beckwith, MD (Orthopedic Surgery) Milly Jakob, MD as Consulting Physician (Orthopedic Surgery) Hennie Duos, MD as Consulting Physician (Rheumatology) Patient Instructions  Glad you are doing better . Continue lifestyle intervention healthy eating and exercise .   Get sleep .  Will notify you  of labs when available.    Preventive Care 40-64 Years, Female Preventive care refers to lifestyle choices and visits with your health care provider that can promote health and wellness. What does preventive care include?   A yearly physical exam. This is also called an annual well check.  Dental exams once or twice a year.  Routine eye exams. Ask your health care provider how often you should have your eyes checked.  Personal lifestyle choices, including: ? Daily care of your teeth and gums. ? Regular physical activity. ? Eating a healthy diet. ? Avoiding tobacco and drug use. ? Limiting alcohol use. ? Practicing safe sex. ? Taking low-dose aspirin daily starting at age 54. ? Taking vitamin and mineral supplements as recommended by your health care provider. What happens during an annual well check? The services and screenings done by your health care provider during your annual well check will depend on your age, overall health, lifestyle risk factors, and family history of disease. Counseling Your health care provider may ask you questions about your:  Alcohol use.  Tobacco use.  Drug use.  Emotional well-being.  Home and relationship well-being.  Sexual activity.  Eating habits.  Work and work Statistician.  Method of birth control.  Menstrual cycle.  Pregnancy history. Screening You may have the following tests or measurements:  Height, weight, and  BMI.  Blood pressure.  Lipid and cholesterol levels. These may be checked every 5 years, or more frequently if you are over 25 years old.  Skin check.  Lung cancer screening. You may have this screening every year starting at age 21 if you have a 30-pack-year history of smoking and currently smoke or have quit within the past 15 years.  Colorectal cancer screening. All adults should have this screening starting at age 45 and continuing until age 65. Your health care provider may recommend screening at age 16. You will have tests every 1-10 years, depending on your results and the type of screening test. People at increased risk should start screening at an earlier age. Screening tests may include: ? Guaiac-based fecal occult blood testing. ? Fecal immunochemical test (FIT). ? Stool DNA test. ? Virtual colonoscopy. ? Sigmoidoscopy. During this test, a flexible tube with a tiny camera (sigmoidoscope) is used to examine your rectum and lower colon. The sigmoidoscope is inserted through your anus into your rectum and lower colon. ? Colonoscopy. During this test, a long, thin, flexible tube with a tiny camera (colonoscope) is used to examine your entire colon and rectum.  Hepatitis C blood test.  Hepatitis B blood test.  Sexually transmitted disease (STD) testing.  Diabetes screening. This is done by checking your blood sugar (glucose) after you have not eaten for a while (fasting). You may have this done every 1-3 years.  Mammogram. This may be done every 1-2 years. Talk to your health care provider about when you should start having regular mammograms. This may depend on whether you have a family history of breast cancer.  BRCA-related cancer screening. This may be done if you have a family history of breast, ovarian, tubal, or peritoneal cancers.  Pelvic exam and Pap test. This may be done every 3 years starting at age 45. Starting at age 70, this may be done every 5 years if you have a  Pap test in combination with an HPV test.  Bone density scan. This is done to screen for osteoporosis. You may have this scan if you are at high risk for osteoporosis. Discuss your test results, treatment options, and if necessary, the need for more tests with your health care provider. Vaccines Your health care provider may recommend certain vaccines, such as:  Influenza vaccine. This is recommended every year.  Tetanus, diphtheria, and acellular pertussis (Tdap, Td) vaccine. You may need a Td booster every 10 years.  Varicella vaccine. You may need this if you have not been vaccinated.  Zoster vaccine. You may need this after age 89.  Measles, mumps, and rubella (MMR) vaccine. You may need at least one dose of MMR if you were born in 1957 or later. You may also need a second dose.  Pneumococcal 13-valent conjugate (PCV13) vaccine. You may need this if you have certain conditions and were not previously vaccinated.  Pneumococcal polysaccharide (PPSV23) vaccine. You may need one or two doses if you smoke cigarettes or if you have certain conditions.  Meningococcal vaccine. You may need this if you have certain conditions.  Hepatitis A vaccine. You may need this if you have certain conditions or if you travel or work in places where you may be exposed to hepatitis A.  Hepatitis B vaccine. You may need this if you have certain conditions or if you travel or work in places where you may be exposed to hepatitis B.  Haemophilus influenzae type b (Hib) vaccine. You may need this if you have certain  conditions. Talk to your health care provider about which screenings and vaccines you need and how often you need them. This information is not intended to replace advice given to you by your health care provider. Make sure you discuss any questions you have with your health care provider. Document Released: 08/13/2015 Document Revised: 09/06/2017 Document Reviewed: 05/18/2015 Elsevier Interactive  Patient Education  2019 Hartley K. Brnadon Eoff M.D.

## 2018-08-19 ENCOUNTER — Encounter: Payer: Self-pay | Admitting: Internal Medicine

## 2018-08-19 ENCOUNTER — Ambulatory Visit: Payer: No Typology Code available for payment source | Admitting: Internal Medicine

## 2018-08-19 VITALS — BP 106/68 | HR 70 | Temp 98.4°F | Ht 61.5 in | Wt 110.0 lb

## 2018-08-19 DIAGNOSIS — Z23 Encounter for immunization: Secondary | ICD-10-CM

## 2018-08-19 DIAGNOSIS — M2550 Pain in unspecified joint: Secondary | ICD-10-CM | POA: Diagnosis not present

## 2018-08-19 DIAGNOSIS — Z114 Encounter for screening for human immunodeficiency virus [HIV]: Secondary | ICD-10-CM

## 2018-08-19 DIAGNOSIS — Z0000 Encounter for general adult medical examination without abnormal findings: Secondary | ICD-10-CM

## 2018-08-19 DIAGNOSIS — R7989 Other specified abnormal findings of blood chemistry: Secondary | ICD-10-CM

## 2018-08-19 DIAGNOSIS — Z79899 Other long term (current) drug therapy: Secondary | ICD-10-CM | POA: Diagnosis not present

## 2018-08-19 LAB — HEPATIC FUNCTION PANEL
ALBUMIN: 4.4 g/dL (ref 3.5–5.2)
ALK PHOS: 54 U/L (ref 39–117)
ALT: 7 U/L (ref 0–35)
AST: 15 U/L (ref 0–37)
Bilirubin, Direct: 0.1 mg/dL (ref 0.0–0.3)
Total Bilirubin: 0.5 mg/dL (ref 0.2–1.2)
Total Protein: 6.6 g/dL (ref 6.0–8.3)

## 2018-08-19 LAB — BASIC METABOLIC PANEL
BUN: 21 mg/dL (ref 6–23)
CO2: 30 mEq/L (ref 19–32)
Calcium: 9.5 mg/dL (ref 8.4–10.5)
Chloride: 104 mEq/L (ref 96–112)
Creatinine, Ser: 0.72 mg/dL (ref 0.40–1.20)
GFR: 84.85 mL/min (ref >60.00)
Glucose, Bld: 76 mg/dL (ref 70–99)
POTASSIUM: 4 meq/L (ref 3.5–5.1)
SODIUM: 141 meq/L (ref 135–145)

## 2018-08-19 LAB — LIPID PANEL
CHOLESTEROL: 216 mg/dL — AB (ref 0–200)
HDL: 76.4 mg/dL (ref >39.00)
LDL Cholesterol: 129 mg/dL — ABNORMAL HIGH (ref 0–99)
NonHDL: 139.85
Total CHOL/HDL Ratio: 3
Triglycerides: 55 mg/dL (ref 0.0–149.0)
VLDL: 11 mg/dL (ref 0.0–40.0)

## 2018-08-19 LAB — CBC WITH DIFFERENTIAL/PLATELET
Basophils Absolute: 0 10*3/uL (ref 0.0–0.1)
Basophils Relative: 0.4 % (ref 0.0–3.0)
EOS PCT: 1.1 % (ref 0.0–5.0)
Eosinophils Absolute: 0.1 10*3/uL (ref 0.0–0.7)
HEMATOCRIT: 39.9 % (ref 36.0–46.0)
HEMOGLOBIN: 13.7 g/dL (ref 12.0–15.0)
LYMPHS PCT: 21.3 % (ref 12.0–46.0)
Lymphs Abs: 1.3 10*3/uL (ref 0.7–4.0)
MCHC: 34.4 g/dL (ref 30.0–36.0)
MCV: 87.4 fl (ref 78.0–100.0)
MONOS PCT: 7.7 % (ref 3.0–12.0)
Monocytes Absolute: 0.5 10*3/uL (ref 0.1–1.0)
Neutro Abs: 4.2 10*3/uL (ref 1.4–7.7)
Neutrophils Relative %: 69.5 % (ref 43.0–77.0)
Platelets: 272 10*3/uL (ref 150.0–400.0)
RBC: 4.56 Mil/uL (ref 3.87–5.11)
RDW: 13.5 % (ref 11.5–15.5)
WBC: 6 10*3/uL (ref 4.0–10.5)

## 2018-08-19 NOTE — Patient Instructions (Signed)
Glad you are doing better . Continue lifestyle intervention healthy eating and exercise .   Get sleep .  Will notify you  of labs when available.    Preventive Care 40-64 Years, Female Preventive care refers to lifestyle choices and visits with your health care provider that can promote health and wellness. What does preventive care include?   A yearly physical exam. This is also called an annual well check.  Dental exams once or twice a year.  Routine eye exams. Ask your health care provider how often you should have your eyes checked.  Personal lifestyle choices, including: ? Daily care of your teeth and gums. ? Regular physical activity. ? Eating a healthy diet. ? Avoiding tobacco and drug use. ? Limiting alcohol use. ? Practicing safe sex. ? Taking low-dose aspirin daily starting at age 37. ? Taking vitamin and mineral supplements as recommended by your health care provider. What happens during an annual well check? The services and screenings done by your health care provider during your annual well check will depend on your age, overall health, lifestyle risk factors, and family history of disease. Counseling Your health care provider may ask you questions about your:  Alcohol use.  Tobacco use.  Drug use.  Emotional well-being.  Home and relationship well-being.  Sexual activity.  Eating habits.  Work and work Statistician.  Method of birth control.  Menstrual cycle.  Pregnancy history. Screening You may have the following tests or measurements:  Height, weight, and BMI.  Blood pressure.  Lipid and cholesterol levels. These may be checked every 5 years, or more frequently if you are over 78 years old.  Skin check.  Lung cancer screening. You may have this screening every year starting at age 18 if you have a 30-pack-year history of smoking and currently smoke or have quit within the past 15 years.  Colorectal cancer screening. All adults should  have this screening starting at age 40 and continuing until age 3. Your health care provider may recommend screening at age 27. You will have tests every 1-10 years, depending on your results and the type of screening test. People at increased risk should start screening at an earlier age. Screening tests may include: ? Guaiac-based fecal occult blood testing. ? Fecal immunochemical test (FIT). ? Stool DNA test. ? Virtual colonoscopy. ? Sigmoidoscopy. During this test, a flexible tube with a tiny camera (sigmoidoscope) is used to examine your rectum and lower colon. The sigmoidoscope is inserted through your anus into your rectum and lower colon. ? Colonoscopy. During this test, a long, thin, flexible tube with a tiny camera (colonoscope) is used to examine your entire colon and rectum.  Hepatitis C blood test.  Hepatitis B blood test.  Sexually transmitted disease (STD) testing.  Diabetes screening. This is done by checking your blood sugar (glucose) after you have not eaten for a while (fasting). You may have this done every 1-3 years.  Mammogram. This may be done every 1-2 years. Talk to your health care provider about when you should start having regular mammograms. This may depend on whether you have a family history of breast cancer.  BRCA-related cancer screening. This may be done if you have a family history of breast, ovarian, tubal, or peritoneal cancers.  Pelvic exam and Pap test. This may be done every 3 years starting at age 59. Starting at age 2, this may be done every 5 years if you have a Pap test in combination with an HPV  test.  Bone density scan. This is done to screen for osteoporosis. You may have this scan if you are at high risk for osteoporosis. Discuss your test results, treatment options, and if necessary, the need for more tests with your health care provider. Vaccines Your health care provider may recommend certain vaccines, such as:  Influenza vaccine. This  is recommended every year.  Tetanus, diphtheria, and acellular pertussis (Tdap, Td) vaccine. You may need a Td booster every 10 years.  Varicella vaccine. You may need this if you have not been vaccinated.  Zoster vaccine. You may need this after age 60.  Measles, mumps, and rubella (MMR) vaccine. You may need at least one dose of MMR if you were born in 1957 or later. You may also need a second dose.  Pneumococcal 13-valent conjugate (PCV13) vaccine. You may need this if you have certain conditions and were not previously vaccinated.  Pneumococcal polysaccharide (PPSV23) vaccine. You may need one or two doses if you smoke cigarettes or if you have certain conditions.  Meningococcal vaccine. You may need this if you have certain conditions.  Hepatitis A vaccine. You may need this if you have certain conditions or if you travel or work in places where you may be exposed to hepatitis A.  Hepatitis B vaccine. You may need this if you have certain conditions or if you travel or work in places where you may be exposed to hepatitis B.  Haemophilus influenzae type b (Hib) vaccine. You may need this if you have certain conditions. Talk to your health care provider about which screenings and vaccines you need and how often you need them. This information is not intended to replace advice given to you by your health care provider. Make sure you discuss any questions you have with your health care provider. Document Released: 08/13/2015 Document Revised: 09/06/2017 Document Reviewed: 05/18/2015 Elsevier Interactive Patient Education  2019 [employer].

## 2018-08-20 LAB — HIV ANTIBODY (ROUTINE TESTING W REFLEX): HIV: NONREACTIVE

## 2018-12-25 ENCOUNTER — Encounter: Payer: Self-pay | Admitting: Family Medicine

## 2018-12-25 ENCOUNTER — Ambulatory Visit: Payer: No Typology Code available for payment source | Admitting: Family Medicine

## 2018-12-25 VITALS — BP 118/70 | HR 71 | Temp 98.7°F | Ht 61.5 in | Wt 108.8 lb

## 2018-12-25 DIAGNOSIS — M5441 Lumbago with sciatica, right side: Secondary | ICD-10-CM

## 2018-12-25 MED ORDER — PREDNISONE 5 MG PO TABS
ORAL_TABLET | ORAL | 0 refills | Status: DC
Start: 2018-12-25 — End: 2020-04-14

## 2018-12-25 NOTE — Patient Instructions (Signed)
Nice to meet you Please try the medicine  Please try the exercises  Please try heat on the lower back  Please send me a message in MyChart with any questions or updates.  Please see me back in 4-6 weeks.   --Dr. Raeford Razor

## 2018-12-25 NOTE — Progress Notes (Signed)
Stephanie Hoffman - 53 y.o. female MRN [id number]  Date of birth: 1965-09-26  SUBJECTIVE:  Including CC & ROS.  Chief Complaint  Patient presents with  . Pain    lower back, right hip pain right leg/ 1 month    Stephanie Hoffman is a 53 y.o. female that is presenting with right lower back pain and right leg pain.  The pain is been ongoing for a few weeks.  The pain seems to be getting worse.  Has not had any improvement with home modalities.  Denies any specific inciting event.  She feels it on the lateral component of the upper leg and lower leg.  Has some pain in the right lower back.  The pain is sharp and stabbing.  It seems to be worse with certain movements.  No saddle anesthesia or urinary incontinence   Review of Systems  Constitutional: Negative for fever.  HENT: Negative for congestion.   Respiratory: Negative for cough.   Cardiovascular: Negative for chest pain.  Gastrointestinal: Negative for abdominal pain.  Musculoskeletal: Positive for back pain.  Skin: Negative for color change.  Neurological: Negative for weakness.  Hematological: Negative for adenopathy.    HISTORY: Past Medical, Surgical, Social, and Family History Reviewed & Updated per EMR.   Pertinent Historical Findings include:  Past Medical History:  Diagnosis Date  . Angio-edema   . Gallstones    removed 2000  . Headache(784.0)   . Heart murmur   . Hyperlipidemia   . Pneumonia   . Recurrent upper respiratory infection (URI)   . Urticaria   . Varicose veins     Past Surgical History:  Procedure Laterality Date  . CESAREAN SECTION    . CHOLECYSTECTOMY     2000  . CHOLECYSTECTOMY N/A 2007  . KNEE SURGERY     left torn ligament 2007  . SHOULDER SURGERY     right and left  . WISDOM TOOTH EXTRACTION Bilateral 1997    Allergies  Allergen Reactions  . Amoxicillin Diarrhea  . Coconut Oil Itching  . Codeine Itching  . Magnesium-Containing Compounds Itching  . Penicillins Rash    Has  patient had a PCN reaction causing immediate rash, facial/tongue/throat swelling, SOB or lightheadedness with hypotension: No Has patient had a PCN reaction causing severe rash involving mucus membranes or skin necrosis: No Has patient had a PCN reaction that required hospitalization: No Has patient had a PCN reaction occurring within the last 10 years: No If all of the above answers are "NO", then may proceed with Cephalosporin use.   . Vitamin E-Safflower Oil Itching    Family History  Problem Relation Age of Onset  . Arthritis Mother   . Diabetes Mother   . Hyperlipidemia Mother   . Heart disease Mother   . Colon cancer Mother 62  . Hypertension Father   . Prostate cancer Maternal Grandfather 80  . Stroke Paternal Grandfather   . Stomach cancer Maternal Grandmother 79  . Esophageal cancer Neg Hx   . Rectal cancer Neg Hx      Social History   Socioeconomic History  . Marital status: Married    Spouse name: Not on file  . Number of children: Not on file  . Years of education: Not on file  . Highest education level: Not on file  Occupational History  . Not on file  Social Needs  . Financial resource strain: Not on file  . Food insecurity:    Worry: Not on file  Inability: Not on file  . Transportation needs:    Medical: Not on file    Non-medical: Not on file  Tobacco Use  . Smoking status: Never Smoker  . Smokeless tobacco: Never Used  Substance and Sexual Activity  . Alcohol use: No  . Drug use: No  . Sexual activity: Yes    Comment: husband had vastectomy  Lifestyle  . Physical activity:    Days per week: Not on file    Minutes per session: Not on file  . Stress: Not on file  Relationships  . Social connections:    Talks on phone: Not on file    Gets together: Not on file    Attends religious service: Not on file    Active member of club or organization: Not on file    Attends meetings of clubs or organizations: Not on file    Relationship status:  Not on file  . Intimate partner violence:    Fear of current or ex partner: Not on file    Emotionally abused: Not on file    Physically abused: Not on file    Forced sexual activity: Not on file  Other Topics Concern  . 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   Married   Regular exercise- yes   HH of 3 no pet s now    not ets     PHYSICAL EXAM:  VS: BP 118/70   Pulse 71   Temp 98.7 F (37.1 C) (Oral)   Ht 5' 1.5" (1.562 m)   Wt 108 lb 12.8 oz (49.4 kg)   SpO2 100%   BMI 20.22 kg/m  Physical Exam Gen: NAD, alert, cooperative with exam, well-appearing ENT: normal lips, normal nasal mucosa,  Eye: normal EOM, normal conjunctiva and lids CV:  no edema, +2 pedal pulses   Resp: no accessory muscle use, non-labored,  Skin: no rashes, no areas of induration  Neuro: normal tone, normal sensation to touch Psych:  normal insight, alert and oriented MSK:  Back/Right hip:  No significant tenderness palpation of the greater trochanter or SI joint. Normal internal and external rotation of the right hip. Normal strength resistance with hip flexion, knee flexion extension, plantarflexion and dorsiflexion. Negative straight leg raise. Neurovascular intact      ASSESSMENT & PLAN:   Acute right-sided low back pain with right-sided sciatica Pain seems to be related to radiculopathy.  Could be related to hip abduction weakness.  May have component of greater trochanteric bursitis. -Prednisone. -Counseled on home exercise therapy and supportive care. -If no improvement can consider injection versus physical therapy.

## 2018-12-26 DIAGNOSIS — M5441 Lumbago with sciatica, right side: Secondary | ICD-10-CM

## 2018-12-26 HISTORY — DX: Lumbago with sciatica, right side: M54.41

## 2018-12-26 NOTE — Assessment & Plan Note (Signed)
Pain seems to be related to radiculopathy.  Could be related to hip abduction weakness.  May have component of greater trochanteric bursitis. -Prednisone. -Counseled on home exercise therapy and supportive care. -If no improvement can consider injection versus physical therapy.

## 2019-07-29 LAB — HM MAMMOGRAPHY

## 2019-08-07 ENCOUNTER — Encounter: Payer: Self-pay | Admitting: Internal Medicine

## 2019-08-22 ENCOUNTER — Other Ambulatory Visit: Payer: Self-pay

## 2019-08-22 ENCOUNTER — Encounter: Payer: Self-pay | Admitting: Internal Medicine

## 2019-08-22 ENCOUNTER — Ambulatory Visit (INDEPENDENT_AMBULATORY_CARE_PROVIDER_SITE_OTHER): Payer: No Typology Code available for payment source | Admitting: Internal Medicine

## 2019-08-22 VITALS — BP 116/62 | HR 75 | Temp 98.5°F | Ht 61.5 in | Wt 108.6 lb

## 2019-08-22 DIAGNOSIS — Z0001 Encounter for general adult medical examination with abnormal findings: Secondary | ICD-10-CM | POA: Diagnosis not present

## 2019-08-22 DIAGNOSIS — Z634 Disappearance and death of family member: Secondary | ICD-10-CM

## 2019-08-22 DIAGNOSIS — Z23 Encounter for immunization: Secondary | ICD-10-CM | POA: Diagnosis not present

## 2019-08-22 DIAGNOSIS — M79641 Pain in right hand: Secondary | ICD-10-CM

## 2019-08-22 DIAGNOSIS — Z0000 Encounter for general adult medical examination without abnormal findings: Secondary | ICD-10-CM

## 2019-08-22 LAB — POC URINALSYSI DIPSTICK (AUTOMATED)
Bilirubin, UA: NEGATIVE
Glucose, UA: NEGATIVE
Ketones, UA: 1
Leukocytes, UA: NEGATIVE
Nitrite, UA: NEGATIVE
Protein, UA: NEGATIVE
Spec Grav, UA: 1.015 (ref 1.010–1.025)
Urobilinogen, UA: NEGATIVE E.U./dL — AB
pH, UA: 6 (ref 5.0–8.0)

## 2019-08-22 LAB — TSH: TSH: 1.29 mIU/L

## 2019-08-22 NOTE — Patient Instructions (Addendum)
Lab and urine today  I think pain is local osteoarthritis   But rechecking for inflammation markers.   continue to try to get sleep and  Healthy eating   If hands  Get very swollne and stiff  And progress let us know .    Health Maintenance, Female Adopting a healthy lifestyle and getting preventive care are important in promoting health and wellness. Ask your health care provider about:  The right schedule for you to have regular tests and exams.  Things you can do on your own to prevent diseases and keep yourself healthy. What should I know about diet, weight, and exercise? Eat a healthy diet   Eat a diet that includes plenty of vegetables, fruits, low-fat dairy products, and lean protein.  Do not eat a lot of foods that are high in solid fats, added sugars, or sodium. Maintain a healthy weight Body mass index (BMI) is used to identify weight problems. It estimates body fat based on height and weight. Your health care provider can help determine your BMI and help you achieve or maintain a healthy weight. Get regular exercise Get regular exercise. This is one of the most important things you can do for your health. Most adults should:  Exercise for at least 150 minutes each week. The exercise should increase your heart rate and make you sweat (moderate-intensity exercise).  Do strengthening exercises at least twice a week. This is in addition to the moderate-intensity exercise.  Spend less time sitting. Even light physical activity can be beneficial. Watch cholesterol and blood lipids Have your blood tested for lipids and cholesterol at 54 years of age, then have this test every 5 years. Have your cholesterol levels checked more often if:  Your lipid or cholesterol levels are high.  You are older than 54 years of age.  You are at high risk for heart disease. What should I know about cancer screening? Depending on your health history and family history, you may need to have  cancer screening at various ages. This may include screening for:  Breast cancer.  Cervical cancer.  Colorectal cancer.  Skin cancer.  Lung cancer. What should I know about heart disease, diabetes, and high blood pressure? Blood pressure and heart disease  High blood pressure causes heart disease and increases the risk of stroke. This is more likely to develop in people who have high blood pressure readings, are of African descent, or are overweight.  Have your blood pressure checked: ? Every 3-5 years if you are 19-45 years of age. ? Every year if you are 72 years old or older. Diabetes Have regular diabetes screenings. This checks your fasting blood sugar level. Have the screening done:  Once every three years after age 59 if you are at a normal weight and have a low risk for diabetes.  More often and at a younger age if you are overweight or have a high risk for diabetes. What should I know about preventing infection? Hepatitis B If you have a higher risk for hepatitis B, you should be screened for this virus. Talk with your health care provider to find out if you are at risk for hepatitis B infection. Hepatitis C Testing is recommended for:  Everyone born from [birth year] through 1965.  Anyone with known risk factors for hepatitis C. Sexually transmitted infections (STIs)  Get screened for STIs, including gonorrhea and chlamydia, if: ? You are sexually active and are younger than 54 years of age. ? You are  older than 54 years of age and your health care provider tells you that you are at risk for this type of infection. ? Your sexual activity has changed since you were last screened, and you are at increased risk for chlamydia or gonorrhea. Ask your health care provider if you are at risk.  Ask your health care provider about whether you are at high risk for HIV. Your health care provider may recommend a prescription medicine to help prevent HIV infection. If you choose to take  medicine to prevent HIV, you should first get tested for HIV. You should then be tested every 3 months for as long as you are taking the medicine. Pregnancy  If you are about to stop having your period (premenopausal) and you may become pregnant, seek counseling before you get pregnant.  Take 400 to 800 micrograms (mcg) of folic acid every day if you become pregnant.  Ask for birth control (contraception) if you want to prevent pregnancy. Osteoporosis and menopause Osteoporosis is a disease in which the bones lose minerals and strength with aging. This can result in bone fractures. If you are 41 years old or older, or if you are at risk for osteoporosis and fractures, ask your health care provider if you should:  Be screened for bone loss.  Take a calcium or vitamin D supplement to lower your risk of fractures.  Be given hormone replacement therapy (HRT) to treat symptoms of menopause. Follow these instructions at home: Lifestyle  Do not use any products that contain nicotine or tobacco, such as cigarettes, e-cigarettes, and chewing tobacco. If you need help quitting, ask your health care provider.  Do not use street drugs.  Do not share needles.  Ask your health care provider for help if you need support or information about quitting drugs. Alcohol use  Do not drink alcohol if: ? Your health care provider tells you not to drink. ? You are pregnant, may be pregnant, or are planning to become pregnant.  If you drink alcohol: ? Limit how much you use to 0-1 drink a day. ? Limit intake if you are breastfeeding.  Be aware of how much alcohol is in your drink. In the [place] one drink equals one 12 oz bottle of beer (355 mL), one 5 oz glass of wine (148 mL), or one 1 oz glass of hard liquor (44 mL). General instructions  Schedule regular health, dental, and eye exams.  Stay current with your vaccines.  Tell your health care provider if: ? You often feel depressed. ? You have  ever been abused or do not feel safe at home. Summary  Adopting a healthy lifestyle and getting preventive care are important in promoting health and wellness.  Follow your health care provider's instructions about healthy diet, exercising, and getting tested or screened for diseases.  Follow your health care provider's instructions on monitoring your cholesterol and blood pressure. This information is not intended to replace advice given to you by your health care provider. Make sure you discuss any questions you have with your health care provider. Document Revised: 07/10/2018 Document Reviewed: 07/10/2018 Elsevier Patient Education  2020 [employer].

## 2019-08-22 NOTE — Progress Notes (Signed)
This visit occurred during the SARS-CoV-2 public health emergency.  Safety protocols were in place, including screening questions prior to the visit, additional usage of staff PPE, and extensive cleaning of exam room while observing appropriate contact time as indicated for disinfecting solutions.    Chief Complaint  Patient presents with  . Annual Exam    Pt wants to discuss elbow pain and her right knuckle "popping out"    HPI: Patient  Stephanie Hoffman  54 y.o. comes in today for [place] visit  Mom passed away in past year 12-Nov-2022 Father care taking some with dementia  Working from home Still grieving  Tender area right base of thumb and left elbow  promince    Remote  Hx of rheum  eval 5-6 years ago and no dx    She is right handed and keyboards all day work   Government social research officer at [employer] Date Due  . COLONOSCOPY  01/27/2020  . MAMMOGRAM  07/28/2020  . PAP SMEAR-Modifier  08/08/2020  . TETANUS/TDAP  09/25/2023  . INFLUENZA VACCINE  Completed  . HIV Screening  Completed   Health Maintenance Review LIFESTYLE:  Exercise:    Some working out  . 5000 per day at least.  Tobacco/ETS: no Alcohol:  no Sugar beverages: fruit juiced  Diluted  Sleep: 6.5-7  8 on weekend. Drug use: no HH of  2  No pets  Work:  69 - 3  Some times works Saturday  Mom passed  March 22   Aspirated   And vascular dementia  And father dementia  And does finances .   ROS:  GEN/ HEENT: No fever, significant weight changes sweats headaches vision problems hearing changes, CV/ PULM; No chest pain shortness of breath cough, syncope,edema  change in exercise tolerance. GI /GU: No adominal pain, vomiting, change in bowel habits. No blood in the stool. No significant GU symptoms. SKIN/HEME: ,no acute skin rashes suspicious lesions or bleeding. No lymphadenopathy, nodules, masses.  NEURO/ PSYCH:  No neurologic signs such as weakness numbness. No depression  anxiety. IMM/ Allergy: No unusual infections.  Allergy .   REST of 12 system review negative except as per HPI   Past Medical History:  Diagnosis Date  . Angio-edema   . Gallstones    removed 11/12/1998  . Headache(784.0)   . Heart murmur   . Hyperlipidemia   . Pneumonia   . Recurrent upper respiratory infection (URI)   . Urticaria   . Varicose veins     Past Surgical History:  Procedure Laterality Date  . CESAREAN SECTION    . CHOLECYSTECTOMY     2000  . CHOLECYSTECTOMY N/A November 11, 2005  . KNEE SURGERY     left torn ligament 11/11/2005  . SHOULDER SURGERY     right and left  . WISDOM TOOTH EXTRACTION Bilateral 1997    Family History  Problem Relation Age of Onset  . Arthritis Mother   . Diabetes Mother   . Hyperlipidemia Mother   . Heart disease Mother   . Colon cancer Mother 55  . Hypertension Father   . Prostate cancer Maternal Grandfather November 12, 1978  . Stroke Paternal Grandfather   . Stomach cancer Maternal Grandmother 79  . Esophageal cancer Neg Hx   . Rectal cancer Neg Hx       Outpatient Medications Prior to Visit  Medication Sig Dispense Refill  . BIOTIN 5000 PO Take 10,000 mg by mouth daily.    Marland Kitchen  diphenhydramine-acetaminophen (TYLENOL PM) 25-500 MG TABS Take 1 tablet by mouth at bedtime as needed (sleep).     . Ginger, Zingiber officinalis, (GINGER ROOT PO) Take 1 capsule by mouth 1 day or 1 dose.    Marland Kitchen OVER THE COUNTER MEDICATION Multiple Vitamin Gummy    . Potassium 99 MG TABS Take 99 mg by mouth daily.    . predniSONE (DELTASONE) 5 MG tablet Take 6 pills for first day, 5 pills second day, 4 pills third day, 3 pills fourth day, 2 pills the fifth day, and 1 pill sixth day. 21 tablet 0  . UNABLE TO FIND Med Name: Amberen 2 capsules daily (hot flashes)     No facility-administered medications prior to visit.     EXAM:  BP 116/62 (BP Location: Right Arm, Patient Position: Sitting, Cuff Size: Normal)   Pulse 75   Temp 98.5 F (36.9 C) (Temporal)   Ht 5' 1.5" (1.562 m)    Wt 108 lb 9.6 oz (49.3 kg)   SpO2 97%   BMI 20.19 kg/m   Body mass index is 20.19 kg/m. Wt Readings from Last 3 Encounters:  08/22/19 108 lb 9.6 oz (49.3 kg)  12/25/18 108 lb 12.8 oz (49.4 kg)  08/19/18 110 lb (49.9 kg)    Physical Exam: Vital signs reviewed [id number] is a well-developed well-nourished alert cooperative    who appears stated age in no acute distress.  HEENT: normocephalic atraumatic , Eyes: PERRL EOM's full, conjunctiva clear, ., Ears: no deformity EAC's clear TMs with normal landmarks.OP,masked NECK: supple without masses, thyromegaly or bruits.edema CHEST/PULM:  Clear to auscultation and percussion breath sounds equal no wheeze , rales or rhonchi. No chest wall deformities or tenderness. Breast: normal by inspection . No dimpling, discharge, masses, tenderness or discharge . CV: PMI is nondisplaced, S1 S2 no gallops, murmurs, rubs. Peripheral pulses are full without delay.No JVD .  ABDOMEN: Bowel sounds normal nontender  No guard or rebound, no hepato splenomegal no CVA tenderness.  No hernia. Extremtities:  No clubbing cyanosis or edema, no acute joint swelling or redness no focal atrophy  Right hand prominent thumb mcp and bony prominence left oelbow and  radia   No atrophy and nl strength   Mild small VV no edema NEURO:  Oriented x3, cranial nerves 3-12 appear to be intact, no obvious focal weakness,gait within normal limits no abnormal reflexes or asymmetrical SKIN: No acute rashes normal turgor, color, no bruising or petechiae. PSYCH: Oriented, good eye contact, no obvious depression anxiety, cognition and judgment appear normal. LN: no cervical axillary inguinal adenopathy  Lab Results  Component Value Date   WBC 6.0 08/19/2018   HGB 13.7 08/19/2018   HCT 39.9 08/19/2018   PLT 272.0 08/19/2018   GLUCOSE 76 08/19/2018   CHOL 216 (H) 08/19/2018   TRIG 55.0 08/19/2018   HDL 76.40 08/19/2018   LDLDIRECT 136.9 08/27/2013   LDLCALC 129 (H) 08/19/2018   ALT 7  08/19/2018   AST 15 08/19/2018   NA 141 08/19/2018   K 4.0 08/19/2018   CL 104 08/19/2018   CREATININE 0.72 08/19/2018   BUN 21 08/19/2018   CO2 30 08/19/2018   TSH 0.93 08/08/2017   HGBA1C 5.7 08/08/2017   MICROALBUR 1.0 07/17/2016    BP Readings from Last 3 Encounters:  08/22/19 116/62  12/25/18 118/70  08/19/18 106/68   ASSESSMENT AND PLAN:  Discussed the following assessment and plan:    ICD-10-CM   1. Visit for preventive health examination  Z00.00 POCT Urinalysis Dipstick (Automated)    ANA    Basic metabolic panel    CBC with Differential/Platelet    C-reactive protein    Cyclic citrul peptide antibody, IgG    Hepatic function panel    Lipid panel    Rheumatoid Factor    Sedimentation rate    TSH    CANCELED: Basic metabolic panel    CANCELED: CBC with Differential/Platelet    CANCELED: Hepatic function panel    CANCELED: Lipid panel    CANCELED: TSH    CANCELED: ANA    CANCELED: Rheumatoid Factor    CANCELED: Cyclic citrul peptide antibody, IgG    CANCELED: Sedimentation rate    CANCELED: C-reactive protein  2. Need for immunization against influenza  Z23 Flu Vaccine QUAD 36+ mos IM    TSH  3. Pain of right hand  M79.641 POCT Urinalysis Dipstick (Automated)    ANA    Basic metabolic panel    CBC with Differential/Platelet    C-reactive protein    Cyclic citrul peptide antibody, IgG    Hepatic function panel    Lipid panel    Rheumatoid Factor    Sedimentation rate    TSH    CANCELED: Basic metabolic panel    CANCELED: CBC with Differential/Platelet    CANCELED: Hepatic function panel    CANCELED: Lipid panel    CANCELED: TSH    CANCELED: ANA    CANCELED: Rheumatoid Factor    CANCELED: Cyclic citrul peptide antibody, IgG    CANCELED: Sedimentation rate    CANCELED: C-reactive protein  4. Bereavement  Z63.4    Lab is fasting  But late in day ( schedule misshap)    If joint issues are   persistent or progressive let us know  But seems like   djd and overuse Patient Care Team: Mourad Cwikla, Standley Brooking, MD as PCP - General Elsie Saas, MD (Orthopedic Surgery) Milly Jakob, MD as Consulting Physician (Orthopedic Surgery) Hennie Duos, MD as Consulting Physician (Rheumatology) Patient Instructions  Lab and urine today  I think pain is local osteoarthritis   But rechecking for inflammation markers.   continue to try to get sleep and  Healthy eating   If hands  Get very swollne and stiff  And progress let us know .    Health Maintenance, Female Adopting a healthy lifestyle and getting preventive care are important in promoting health and wellness. Ask your health care provider about:  The right schedule for you to have regular tests and exams.  Things you can do on your own to prevent diseases and keep yourself healthy. What should I know about diet, weight, and exercise? Eat a healthy diet   Eat a diet that includes plenty of vegetables, fruits, low-fat dairy products, and lean protein.  Do not eat a lot of foods that are high in solid fats, added sugars, or sodium. Maintain a healthy weight Body mass index (BMI) is used to identify weight problems. It estimates body fat based on height and weight. Your health care provider can help determine your BMI and help you achieve or maintain a healthy weight. Get regular exercise Get regular exercise. This is one of the most important things you can do for your health. Most adults should:  Exercise for at least 150 minutes each week. The exercise should increase your heart rate and make you sweat (moderate-intensity exercise).  Do strengthening exercises at least twice a week. This is in addition to the  moderate-intensity exercise.  Spend less time sitting. Even light physical activity can be beneficial. Watch cholesterol and blood lipids Have your blood tested for lipids and cholesterol at 54 years of age, then have this test every 5 years. Have your cholesterol levels checked  more often if:  Your lipid or cholesterol levels are high.  You are older than 54 years of age.  You are at high risk for heart disease. What should I know about cancer screening? Depending on your health history and family history, you may need to have cancer screening at various ages. This may include screening for:  Breast cancer.  Cervical cancer.  Colorectal cancer.  Skin cancer.  Lung cancer. What should I know about heart disease, diabetes, and high blood pressure? Blood pressure and heart disease  High blood pressure causes heart disease and increases the risk of stroke. This is more likely to develop in people who have high blood pressure readings, are of African descent, or are overweight.  Have your blood pressure checked: ? Every 3-5 years if you are 26-40 years of age. ? Every year if you are 20 years old or older. Diabetes Have regular diabetes screenings. This checks your fasting blood sugar level. Have the screening done:  Once every three years after age 46 if you are at a normal weight and have a low risk for diabetes.  More often and at a younger age if you are overweight or have a high risk for diabetes. What should I know about preventing infection? Hepatitis B If you have a higher risk for hepatitis B, you should be screened for this virus. Talk with your health care provider to find out if you are at risk for hepatitis B infection. Hepatitis C Testing is recommended for:  Everyone born from [birth year] through 1965.  Anyone with known risk factors for hepatitis C. Sexually transmitted infections (STIs)  Get screened for STIs, including gonorrhea and chlamydia, if: ? You are sexually active and are younger than 54 years of age. ? You are older than 54 years of age and your health care provider tells you that you are at risk for this type of infection. ? Your sexual activity has changed since you were last screened, and you are at increased risk for  chlamydia or gonorrhea. Ask your health care provider if you are at risk.  Ask your health care provider about whether you are at high risk for HIV. Your health care provider may recommend a prescription medicine to help prevent HIV infection. If you choose to take medicine to prevent HIV, you should first get tested for HIV. You should then be tested every 3 months for as long as you are taking the medicine. Pregnancy  If you are about to stop having your period (premenopausal) and you may become pregnant, seek counseling before you get pregnant.  Take 400 to 800 micrograms (mcg) of folic acid every day if you become pregnant.  Ask for birth control (contraception) if you want to prevent pregnancy. Osteoporosis and menopause Osteoporosis is a disease in which the bones lose minerals and strength with aging. This can result in bone fractures. If you are 52 years old or older, or if you are at risk for osteoporosis and fractures, ask your health care provider if you should:  Be screened for bone loss.  Take a calcium or vitamin D supplement to lower your risk of fractures.  Be given hormone replacement therapy (HRT) to treat symptoms of menopause. Follow  these instructions at home: Lifestyle  Do not use any products that contain nicotine or tobacco, such as cigarettes, e-cigarettes, and chewing tobacco. If you need help quitting, ask your health care provider.  Do not use street drugs.  Do not share needles.  Ask your health care provider for help if you need support or information about quitting drugs. Alcohol use  Do not drink alcohol if: ? Your health care provider tells you not to drink. ? You are pregnant, may be pregnant, or are planning to become pregnant.  If you drink alcohol: ? Limit how much you use to 0-1 drink a day. ? Limit intake if you are breastfeeding.  Be aware of how much alcohol is in your drink. In the [place] one drink equals one 12 oz bottle of beer (355  mL), one 5 oz glass of wine (148 mL), or one 1 oz glass of hard liquor (44 mL). General instructions  Schedule regular health, dental, and eye exams.  Stay current with your vaccines.  Tell your health care provider if: ? You often feel depressed. ? You have ever been abused or do not feel safe at home. Summary  Adopting a healthy lifestyle and getting preventive care are important in promoting health and wellness.  Follow your health care provider's instructions about healthy diet, exercising, and getting tested or screened for diseases.  Follow your health care provider's instructions on monitoring your cholesterol and blood pressure. This information is not intended to replace advice given to you by your health care provider. Make sure you discuss any questions you have with your health care provider. Document Revised: 07/10/2018 Document Reviewed: 07/10/2018 Elsevier Patient Education  2020 [place] Viney Acocella M.D.

## 2019-08-27 LAB — HEPATIC FUNCTION PANEL
AG Ratio: 1.6 (calc) (ref 1.0–2.5)
ALT: 7 U/L (ref 6–29)
AST: 14 U/L (ref 10–35)
Albumin: 4.9 g/dL (ref 3.6–5.1)
Alkaline phosphatase (APISO): 56 U/L (ref 37–153)
Bilirubin, Direct: 0.1 mg/dL (ref 0.0–0.2)
Globulin: 3 g/dL (calc) (ref 1.9–3.7)
Indirect Bilirubin: 0.4 mg/dL (calc) (ref 0.2–1.2)
Total Bilirubin: 0.5 mg/dL (ref 0.2–1.2)
Total Protein: 7.9 g/dL (ref 6.1–8.1)

## 2019-08-27 LAB — RHEUMATOID FACTOR: Rhuematoid fact SerPl-aCnc: <14 IU/mL (ref <14)

## 2019-08-27 LAB — CBC WITH DIFFERENTIAL/PLATELET
Absolute Monocytes: 371 cells/uL (ref 200–950)
Basophils Absolute: 40 cells/uL (ref 0–200)
Basophils Relative: 0.7 %
Eosinophils Absolute: 11 cells/uL — ABNORMAL LOW (ref 15–500)
Eosinophils Relative: 0.2 %
HCT: 41.9 % (ref 35.0–45.0)
Hemoglobin: 14 g/dL (ref 11.7–15.5)
Lymphs Abs: 1345 cells/uL (ref 850–3900)
MCH: 29.2 pg (ref 27.0–33.0)
MCHC: 33.4 g/dL (ref 32.0–36.0)
MCV: 87.3 fL (ref 80.0–100.0)
MPV: 9.6 fL (ref 7.5–12.5)
Monocytes Relative: 6.5 %
Neutro Abs: 3933 cells/uL (ref 1500–7800)
Neutrophils Relative %: 69 %
Platelets: 271 10*3/uL (ref 140–400)
RBC: 4.8 10*6/uL (ref 3.80–5.10)
RDW: 11.8 % (ref 11.0–15.0)
Total Lymphocyte: 23.6 %
WBC: 5.7 10*3/uL (ref 3.8–10.8)

## 2019-08-27 LAB — BASIC METABOLIC PANEL
BUN: 20 mg/dL (ref 7–25)
CO2: 28 mmol/L (ref 20–32)
Calcium: 10 mg/dL (ref 8.6–10.4)
Chloride: 101 mmol/L (ref 98–110)
Creat: 0.8 mg/dL (ref 0.50–1.05)
Glucose, Bld: 82 mg/dL (ref 65–99)
Potassium: 3.9 mmol/L (ref 3.5–5.3)
Sodium: 138 mmol/L (ref 135–146)

## 2019-08-27 LAB — LIPID PANEL
Cholesterol: 248 mg/dL — ABNORMAL HIGH (ref <200)
HDL: 84 mg/dL (ref >=50)
LDL Cholesterol (Calc): 146 mg/dL (calc) — ABNORMAL HIGH
Non-HDL Cholesterol (Calc): 164 mg/dL (calc) — ABNORMAL HIGH (ref <130)
Total CHOL/HDL Ratio: 3 (calc) (ref <5.0)
Triglycerides: 74 mg/dL (ref <150)

## 2019-08-27 LAB — ANA: Anti Nuclear Antibody (ANA): POSITIVE — AB

## 2019-08-27 LAB — ANTI-NUCLEAR AB-TITER (ANA TITER): ANA Titer 1: 1:80 {titer} — ABNORMAL HIGH

## 2019-08-27 LAB — CYCLIC CITRUL PEPTIDE ANTIBODY, IGG: Cyclic Citrullin Peptide Ab: <16 UNITS

## 2019-08-27 LAB — C-REACTIVE PROTEIN: CRP: 0.5 mg/L (ref <8.0)

## 2019-08-27 LAB — SEDIMENTATION RATE: Sed Rate: 2 mm/h (ref 0–30)

## 2019-08-27 NOTE — Progress Notes (Signed)
Normal results except cholesterol is elevated.   One screening test  with arthritis is pending ,ANA positive may not be significant  to your health  will let you know when result back.    Work on   eating healthy and activity exrcise s tolerated   will help the cholesterol  The 10-year ASCVD risk score Mikey Bussing DC Jr., et al., 2013) is: 1.1% which is low risk

## 2019-09-02 NOTE — Progress Notes (Signed)
Follow up note  your ANA titer   is not high and may not be significant at all . If  your joint pains   progress and get much worse we can have you see rheumatology again but dont think it will be helpful at this time  ANA is a antinuclear antibody test  that can be elevated in some types of arthritis  but  the level has to be very high to be clinically signifiant and  yours  is barely elevated   . We can follow up at your visits in future

## 2019-11-19 ENCOUNTER — Encounter: Payer: Self-pay | Admitting: Internal Medicine

## 2020-02-17 ENCOUNTER — Encounter: Payer: Self-pay | Admitting: Internal Medicine

## 2020-04-11 ENCOUNTER — Telehealth: Payer: Self-pay

## 2020-04-11 NOTE — Telephone Encounter (Signed)
Left message for pt to call back to reschedule colon appt. Stephanie Hoffman pt.

## 2020-04-14 ENCOUNTER — Other Ambulatory Visit: Payer: Self-pay

## 2020-04-14 ENCOUNTER — Ambulatory Visit (AMBULATORY_SURGERY_CENTER): Payer: Self-pay | Admitting: *Deleted

## 2020-04-14 VITALS — Ht 61.5 in | Wt 113.0 lb

## 2020-04-14 DIAGNOSIS — Z800 Family history of malignant neoplasm of digestive organs: Secondary | ICD-10-CM

## 2020-04-14 NOTE — Progress Notes (Signed)
Hard to wake up from every surgery and colonoscopy per pt.   Patient is here in-person for PV. Patient denies any allergies to eggs or soy. Patient denies any problems with anesthesia/sedation. Patient denies any oxygen use at home. Patient denies taking any diet/weight loss medications or blood thinners. Patient is not being treated for MRSA or C-diff. Patient is aware of our care-partner policy and [facility] safety protocol. EMMI education assisgned to the patient for the procedure, sent to MyChart.   COVID-19 vaccines completed on 10/28/19, per patient.

## 2020-04-15 ENCOUNTER — Encounter: Payer: Self-pay | Admitting: Gastroenterology

## 2020-04-28 ENCOUNTER — Encounter: Payer: No Typology Code available for payment source | Admitting: Internal Medicine

## 2020-04-29 ENCOUNTER — Encounter: Payer: No Typology Code available for payment source | Admitting: Gastroenterology

## 2020-04-30 ENCOUNTER — Encounter: Payer: Self-pay | Admitting: Gastroenterology

## 2020-04-30 ENCOUNTER — Ambulatory Visit (AMBULATORY_SURGERY_CENTER): Payer: No Typology Code available for payment source | Admitting: Gastroenterology

## 2020-04-30 ENCOUNTER — Other Ambulatory Visit: Payer: Self-pay

## 2020-04-30 VITALS — BP 135/77 | HR 75 | Temp 97.5°F | Resp 19 | Ht 61.0 in | Wt 113.0 lb

## 2020-04-30 DIAGNOSIS — Z800 Family history of malignant neoplasm of digestive organs: Secondary | ICD-10-CM | POA: Diagnosis not present

## 2020-04-30 DIAGNOSIS — Z1211 Encounter for screening for malignant neoplasm of colon: Secondary | ICD-10-CM | POA: Diagnosis not present

## 2020-04-30 MED ORDER — SODIUM CHLORIDE 0.9 % IV SOLN: 500.0000 mL | INTRAVENOUS | Status: DC

## 2020-04-30 NOTE — Progress Notes (Signed)
pt tolerated well. VSS. awake and to recovery. Report given to RN.  

## 2020-04-30 NOTE — Progress Notes (Signed)
Pt's states no medical or surgical changes since previsit or office visit.  Vitals- Courtney 

## 2020-04-30 NOTE — Op Note (Signed)
[place] Patient Name: Stephanie Hoffman Procedure Date: 04/30/2020 9:12 AM MRN: [id number] Endoscopist: Remo Lipps P. Havery Moros , MD Age: 54 Referring MD:  Date of Birth: 09/28/1965 Gender: Female Account #: [id number] Procedure:                Colonoscopy Indications:              Screening in patient at increased risk: Family                            history of 1st-degree relative with colorectal                            cancer (mother dx age 53) - patient of Dr. Leroy Kennedy,                            I am perfoming the exam today as he is out of the                            office Medicines:                Monitored Anesthesia Care Procedure:                Pre-Anesthesia Assessment:                           - Prior to the procedure, a History and Physical                            was performed, and patient medications and                            allergies were reviewed. The patient's tolerance of                            previous anesthesia was also reviewed. The risks                            and benefits of the procedure and the sedation                            options and risks were discussed with the patient.                            All questions were answered, and informed consent                            was obtained. Prior Anticoagulants: The patient has                            taken no previous anticoagulant or antiplatelet                            agents. ASA Grade Assessment: II - A patient with  mild systemic disease. After reviewing the risks                            and benefits, the patient was deemed in                            satisfactory condition to undergo the procedure.                           After obtaining informed consent, the colonoscope                            was passed under direct vision. Throughout the                            procedure, the patient's blood pressure, pulse, and                             oxygen saturations were monitored continuously. The                            Colonoscope was introduced through the anus and                            advanced to the the cecum, identified by                            appendiceal orifice and ileocecal valve. The                            colonoscopy was performed without difficulty. The                            patient tolerated the procedure well. The quality                            of the bowel preparation was good. The ileocecal                            valve, appendiceal orifice, and rectum were                            photographed. Scope In: 9:19:19 AM Scope Out: 9:37:50 AM Scope Withdrawal Time: 0 hours 15 minutes 48 seconds  Total Procedure Duration: 0 hours 18 minutes 31 seconds  Findings:                 The perianal and digital rectal examinations were                            normal.                           The colon was tortuous.  The exam was otherwise without abnormality. No                            polyps. Complications:            No immediate complications. Estimated blood loss:                            None. Estimated Blood Loss:     Estimated blood loss: none. Impression:               - Tortuous colon.                           - The examination was otherwise normal.                           - No specimens collected. Recommendation:           - Patient has a contact number available for                            emergencies. The signs and symptoms of potential                            delayed complications were discussed with the                            patient. Return to normal activities tomorrow.                            Written discharge instructions were provided to the                            patient.                           - Resume previous diet.                           - Continue present medications.                            - Repeat colonoscopy in 5 years for screening                            purposes with Dr. Fraser Din P. Laisha Rau, MD 04/30/2020 9:42:20 AM This report has been signed electronically.

## 2020-04-30 NOTE — Patient Instructions (Signed)
YOU HAD AN ENDOSCOPIC PROCEDURE TODAY AT THE [facility]:   Refer to the procedure report that was given to you for any specific questions about what was found during the examination.  If the procedure report does not answer your questions, please call your gastroenterologist to clarify.  If you requested that your care partner not be given the details of your procedure findings, then the procedure report has been included in a sealed envelope for you to review at your convenience later.  YOU SHOULD EXPECT: Some feelings of bloating in the abdomen. Passage of more gas than usual.  Walking can help get rid of the air that was put into your GI tract during the procedure and reduce the bloating. If you had a lower endoscopy (such as a colonoscopy or flexible sigmoidoscopy) you may notice spotting of blood in your stool or on the toilet paper. If you underwent a bowel prep for your procedure, you may not have a normal bowel movement for a few days.  Please Note:  You might notice some irritation and congestion in your nose or some drainage.  This is from the oxygen used during your procedure.  There is no need for concern and it should clear up in a day or so.  SYMPTOMS TO REPORT IMMEDIATELY:   Following lower endoscopy (colonoscopy or flexible sigmoidoscopy):  Excessive amounts of blood in the stool  Significant tenderness or worsening of abdominal pains  Swelling of the abdomen that is new, acute  Fever of 100F or higher  For urgent or emergent issues, a gastroenterologist can be reached at any hour by calling [phone]. Do not use MyChart messaging for urgent concerns.    DIET:  We do recommend a small meal at first, but then you may proceed to your regular diet.  Drink plenty of fluids but you should avoid alcoholic beverages for 24 hours.  ACTIVITY:  You should plan to take it easy for the rest of today and you should NOT DRIVE or use heavy machinery until tomorrow (because  of the sedation medicines used during the test).    FOLLOW UP: Our staff will call the number listed on your records 48-72 hours following your procedure to check on you and address any questions or concerns that you may have regarding the information given to you following your procedure. If we do not reach you, we will leave a message.  We will attempt to reach you two times.  During this call, we will ask if you have developed any symptoms of COVID 19. If you develop any symptoms (ie: fever, flu-like symptoms, shortness of breath, cough etc.) before then, please call [phone].  If you test positive for Covid 19 in the 2 weeks post procedure, please call and report this information to us.    If any biopsies were taken you will be contacted by phone or by letter within the next 1-3 weeks.  Please call us at [phone] if you have not heard about the biopsies in 3 weeks.    SIGNATURES/CONFIDENTIALITY: You and/or your care partner have signed paperwork which will be entered into your electronic medical record.  These signatures attest to the fact that that the information above on your After Visit Summary has been reviewed and is understood.  Full responsibility of the confidentiality of this discharge information lies with you and/or your care-partner. 

## 2020-05-04 ENCOUNTER — Telehealth: Payer: Self-pay

## 2020-05-04 NOTE — Telephone Encounter (Signed)
  Follow up Call-  Call back number 04/30/2020  Post procedure Call Back phone  # [phone]  Permission to leave phone message Yes  Some recent data might be hidden     Patient questions:  Do you have a fever, pain , or abdominal swelling? No. Pain Score  0 *  Have you tolerated food without any problems? Yes.    Have you been able to return to your normal activities? Yes.    Do you have any questions about your discharge instructions: Diet   No. Medications  No. Follow up visit  No.  Do you have questions or concerns about your Care? No.  Actions: * If pain score is 4 or above: No action needed, pain <4.

## 2020-06-23 ENCOUNTER — Other Ambulatory Visit: Payer: Self-pay

## 2020-06-23 ENCOUNTER — Encounter: Payer: Self-pay | Admitting: Internal Medicine

## 2020-06-23 ENCOUNTER — Telehealth (INDEPENDENT_AMBULATORY_CARE_PROVIDER_SITE_OTHER): Payer: No Typology Code available for payment source | Admitting: Internal Medicine

## 2020-06-23 VITALS — Ht 61.0 in | Wt 110.0 lb

## 2020-06-23 DIAGNOSIS — R053 Chronic cough: Secondary | ICD-10-CM | POA: Diagnosis not present

## 2020-06-23 MED ORDER — PROMETHAZINE-DM 6.25-15 MG/5ML PO SYRP: 5.0000 mL | ORAL_SOLUTION | Freq: Every evening | ORAL | 0 refills | Status: DC | PRN

## 2020-06-23 MED ORDER — ALBUTEROL SULFATE HFA 108 (90 BASE) MCG/ACT IN AERS: 2.0000 | INHALATION_SPRAY | Freq: Four times a day (QID) | RESPIRATORY_TRACT | 1 refills | Status: DC | PRN

## 2020-06-23 MED ORDER — BENZONATATE 100 MG PO CAPS: 100.0000 mg | ORAL_CAPSULE | Freq: Three times a day (TID) | ORAL | 0 refills | Status: DC | PRN

## 2020-06-23 NOTE — Progress Notes (Signed)
Patient presenting with a cough. Has been off and on since May. She had bronchitis at that time. Has been on and off since.    Patient also states she feels like she is experiencing menopause for the last 6 months. Is taking estrogen supplements but claims she is having joint pain.   Pain rated 10/10 in the knees, shoulders, right hand and right foot joints.

## 2020-06-23 NOTE — Progress Notes (Signed)
Virtual Visit via Video Note  I connected with@ on 06/23/20 at 11:00 AM EST by a video enabled telemedicine application and verified that I am speaking with the correct person using two identifiers. Location patient: home Location provider:work office Persons participating in the virtual visit: patient, provider  [place] national recommendations  regarding COVID 19 pandemic   video visit is advised over in office visit for this patient.  Patient aware  of the limitations of evaluation and management by telemedicine and  availability of in person appointments. and agreed to proceed.   HPI: Stephanie Hoffman presents for video visit she has had problem with intermittent coughing episodes over the last 5 to 6 months.  She has been seen in urgent care twice at least at one point got steroids and a Z-Pak and albuterol.  Things get better and worse most recently she has a dry hacking cough although has a little bit of phlegm and it is worse at night keeps her up at night. There is no associated fever chest pain shortness of breath no major nasal stuffiness but could have history of sneezing and upper respiratory symptoms if she is outside with leaves.  She is using NyQuil and other over-the-counter meds. She works from home negative Covid exposure.  Had Covid testing in the summer.  No current itching and sneezing. Has used inhaler in the past but is out of them.  ROS: See pertinent positives and negatives per HPI.  She is having recurrent musculoskeletal problems and in past and her shoulders are clicking like out of joint she has of appointment for evaluation with Raliegh Ip after Thanksgiving.  Past Medical History:  Diagnosis Date  . Angio-edema   . Gallstones    removed 2000  . Headache(784.0)   . Heart murmur   . Hyperlipidemia   . Pneumonia   . Recurrent upper respiratory infection (URI)   . Urticaria   . Varicose veins     Past Surgical History:  Procedure Laterality Date   . CESAREAN SECTION    . CHOLECYSTECTOMY N/A 2007  . COLONOSCOPY  01/27/2015   Dr.Gessner  . KNEE SURGERY     left torn ligament 2007  . SHOULDER SURGERY     right and left  . WISDOM TOOTH EXTRACTION Bilateral 1997    Family History  Problem Relation Age of Onset  . Arthritis Mother   . Diabetes Mother   . Hyperlipidemia Mother   . Heart disease Mother   . Colon cancer Mother 60  . Hypertension Father   . Prostate cancer Maternal Grandfather 80  . Stroke Paternal Grandfather   . Stomach cancer Maternal Grandmother 79  . Esophageal cancer Neg Hx   . Rectal cancer Neg Hx   . Colon polyps Neg Hx     Social History   Tobacco Use  . Smoking status: Never Smoker  . Smokeless tobacco: Never Used  Vaping Use  . Vaping Use: Never used  Substance Use Topics  . Alcohol use: No  . Drug use: No      Current Outpatient Medications:  .  BIOTIN 5000 PO, Take 10,000 mg by mouth daily., Disp: , Rfl:  .  Cholecalciferol (VITAMIN D3 PO), Take by mouth., Disp: , Rfl:  .  Cyanocobalamin (VITAMIN B-12 PO), Take by mouth., Disp: , Rfl:  .  diphenhydramine-acetaminophen (TYLENOL PM) 25-500 MG TABS, Take 1 tablet by mouth at bedtime as needed (sleep). , Disp: , Rfl:  .  Flaxseed, Linseed, (GNP  FLAXSEED PO), Take by mouth daily in the afternoon., Disp: , Rfl:  .  GARLIC PO, Take by mouth., Disp: , Rfl:  .  Ginger, Zingiber officinalis, (GINGER ROOT PO), Take 1 capsule by mouth 1 day or 1 dose., Disp: , Rfl:  .  Ibuprofen-diphenhydrAMINE Cit (ADVIL PM PO), Take by mouth., Disp: , Rfl:  .  Menaquinone-7 (VITAMIN K2 PO), Take by mouth., Disp: , Rfl:  .  Multiple Minerals (CALCIUM/MAGNESIUM/ZINC PO), Take by mouth., Disp: , Rfl:  .  Multiple Vitamins-Minerals (WOMENS MULTIVITAMIN PO), Take by mouth., Disp: , Rfl:  .  Nutritional Supplements (ESTROVEN PO), Take by mouth., Disp: , Rfl:  .  Potassium 99 MG TABS, Take 99 mg by mouth daily., Disp: , Rfl:  .  Turmeric (QC TUMERIC COMPLEX PO), Take  by mouth., Disp: , Rfl:  .  albuterol (VENTOLIN HFA) 108 (90 Base) MCG/ACT inhaler, Inhale 2 puffs into the lungs every 6 (six) hours as needed for wheezing (tight cough  at night)., Disp: 1 each, Rfl: 1 .  benzonatate (TESSALON) 100 MG capsule, Take 1 capsule (100 mg total) by mouth 3 (three) times daily as needed for cough (in day)., Disp: 21 capsule, Rfl: 0 .  promethazine-dextromethorphan (PROMETHAZINE-DM) 6.25-15 MG/5ML syrup, Take 5 mLs by mouth at bedtime as needed for cough (up to every 4-6 hours)., Disp: 118 mL, Rfl: 0  EXAM: BP Readings from Last 3 Encounters:  04/30/20 135/77  08/22/19 116/62  12/25/18 118/70    VITALS per patient if applicable:  GENERAL: alert, oriented, appears well and in no acute distress she looks well without significant congestion little bit hoarse no stridor  HEENT: atraumatic, conjunttiva clear, no obvious abnormalities on inspection of external nose and ears  NECK: normal movements of the head and neck  LUNGS: on inspection no signs of respiratory distress, breathing rate appears normal, no obvious gross SOB, gasping or wheezing  CV: no obvious cyanosis  MS: moves all visible extremities without noticeable abnormality  PSYCH/NEURO: pleasant and cooperative, no obvious depression or anxiety, speech and thought processing grossly intact Lab Results  Component Value Date   WBC 5.7 08/22/2019   HGB 14.0 08/22/2019   HCT 41.9 08/22/2019   PLT 271 08/22/2019   GLUCOSE 82 08/22/2019   CHOL 248 (H) 08/22/2019   TRIG 74 08/22/2019   HDL 84 08/22/2019   LDLDIRECT 136.9 08/27/2013   LDLCALC 146 (H) 08/22/2019   ALT 7 08/22/2019   AST 14 08/22/2019   NA 138 08/22/2019   K 3.9 08/22/2019   CL 101 08/22/2019   CREATININE 0.80 08/22/2019   BUN 20 08/22/2019   CO2 28 08/22/2019   TSH 1.29 08/22/2019   HGBA1C 5.7 08/08/2017   MICROALBUR 1.0 07/17/2016    ASSESSMENT AND PLAN:  Discussed the following assessment and plan:    ICD-10-CM   1.  Cough, persistent  R05.3 DG Chest 2 View    Ambulatory referral to Allergy   Recurrent coughing ongoing over months.  She may have had an initial infection but suspect some underlying allergy based on how this is proceeding. At this time refill albuterol comfort care with cough medicine at night and in the day Have referred to allergy she was seen in number of years ago for angioedema and had negative testing at that time.   updated chest x-ray if having ongoing symptoms without help. Counseled.   Expectant management and discussion of plan and treatment with opportunity to ask questions and all were answered.  The patient agreed with the plan and demonstrated an understanding of the instructions.   Advised to call back or seek an in-person evaluation if worsening  or having  further concerns . Return if symptoms worsen or fail to improve as expected, for Allergy referral placed.Shanon Ace, MD

## 2020-07-29 LAB — HM MAMMOGRAPHY

## 2020-08-04 ENCOUNTER — Other Ambulatory Visit: Payer: Self-pay

## 2020-08-04 ENCOUNTER — Ambulatory Visit (INDEPENDENT_AMBULATORY_CARE_PROVIDER_SITE_OTHER): Payer: No Typology Code available for payment source | Admitting: Allergy

## 2020-08-04 ENCOUNTER — Encounter: Payer: Self-pay | Admitting: Allergy

## 2020-08-04 VITALS — BP 114/78 | HR 72 | Temp 98.1°F | Resp 18 | Ht 61.05 in | Wt 112.4 lb

## 2020-08-04 DIAGNOSIS — J45991 Cough variant asthma: Secondary | ICD-10-CM | POA: Diagnosis not present

## 2020-08-04 MED ORDER — QVAR REDIHALER 80 MCG/ACT IN AERB: 2.0000 | INHALATION_SPRAY | Freq: Two times a day (BID) | RESPIRATORY_TRACT | 5 refills | Status: DC

## 2020-08-04 MED ORDER — ALBUTEROL SULFATE HFA 108 (90 BASE) MCG/ACT IN AERS: 2.0000 | INHALATION_SPRAY | Freq: Four times a day (QID) | RESPIRATORY_TRACT | 1 refills | Status: DC | PRN

## 2020-08-04 NOTE — Patient Instructions (Addendum)
Cough -You may have developed cough variant asthma triggered by bronchitis -You have had improvement in symptoms with the use of prednisone as well as the albuterol inhaler which are all consistent with asthma/reactive airway -Your breathing test today did show evidence of obstruction which is also consistent with asthma/reactive airway.  Your breathing tests normalized after use of albuterol which confirms an asthma diagnosis at this time -Recommend starting a maintenance inhaler medication: Qvar 80 mcg 2 puffs twice a day -Have access to albuterol inhaler 2 puffs every 4-6 hours as needed for cough/wheeze/shortness of breath/chest tightness.  May use 15-20 minutes prior to activity.   Monitor frequency of use.   -Recommend you have the chest x-ray done that was ordered by your PCP -Will obtain environmental allergy panel today  Control goals:   Full participation in all desired activities (may need albuterol before activity)  Albuterol use two time or less a week on average (not counting use with activity)  Cough interfering with sleep two time or less a month  Oral steroids no more than once a year  No hospitalizations   Follow-up in 2 to 3 months or sooner if needed

## 2020-08-04 NOTE — Progress Notes (Signed)
Follow-up Note  RE: KELLEEN STOLZE MRN: [id number] DOB: 28-Mar-1966 Date of Office Visit: 08/04/2020   History of present illness: Stephanie Hoffman is a 55 y.o. female presenting today for cough. She was last seen in the office on 03/25/2018 by Dr. Dellis Anes for adverse food reaction and chronic rhinitis.  In May 2021 she reports having bronchitis and was on prednisone and antibiotic.  She improved from this.  She had another bout in July and again September.  She states she cough seems to recur when she is steroids and antibiotics.  She has been prescribed an albuterol inhaler as well.  She reports the cough is mostly dry at this time.  She states the cough started up again around November 2021 and has been a bit more productive.   She states she has seen orthopedist as she has had surgery on both her shoulders and states was recommended to take pain medication as well as a 2 week prednisone course in November.  She states while on the prednisone her cough improved but worsened again after the course was completed.  She feels the albuterol does help to relieve the cough at the time.  She reports using the albuterol 'way more' about 3-4 times a day.  She reports the coughing spells can keep her up at night. He has been seen by her PCP and multiple urgent care visits for coughing episodes.  She has no associated fever, chest pain or tightness, shortness of breath. She does feel like she gets a tickle in there throat before the coughing starts.  However she states she does not feel like she has nasal drainage.  She states she was provided with a nasal spray from [facility] that she was doing twice a day and states it was drying her nose out too much thus she stopped use.   She has tried use of over-the-counter therapies like NyQuil.   She reports not pets or carpeting in the home. She works from home.   A chest x-ray has been ordered by her PCP however it appears it has not been done yet.  She  has not not had Covid illness and reports has received 2 doses of the Covid vaccine and is set to get her booster this weekend.  She reports she continued to eat shellfish after her last visit with negative testing.  She states she has not had any further issues.  Review of systems: Review of Systems  Constitutional: Negative.   HENT: Negative.   Eyes: Negative.   Respiratory:       See HPI  Cardiovascular: Negative.   Gastrointestinal: Negative.   Musculoskeletal: Positive for joint pain.  Skin: Negative.   Neurological: Negative.     All other systems negative unless noted above in HPI  Past medical/social/surgical/family history have been reviewed and are unchanged unless specifically indicated below.  No changes  Medication List: Current Outpatient Medications  Medication Sig Dispense Refill  . albuterol (VENTOLIN HFA) 108 (90 Base) MCG/ACT inhaler Inhale 2 puffs into the lungs every 6 (six) hours as needed for wheezing (tight cough  at night). 1 each 1  . albuterol (VENTOLIN HFA) 108 (90 Base) MCG/ACT inhaler Inhale 2 puffs into the lungs every 6 (six) hours as needed for wheezing or shortness of breath. 18 g 1  . beclomethasone (QVAR REDIHALER) 80 MCG/ACT inhaler Inhale 2 puffs into the lungs 2 (two) times daily. 1 each 5  . benzonatate (TESSALON) 100 MG capsule Take  1 capsule (100 mg total) by mouth 3 (three) times daily as needed for cough (in day). 21 capsule 0  . BIOTIN 5000 PO Take 10,000 mg by mouth daily.    Renard Hamper Cohosh (REMIFEMIN PO) Take by mouth.    . Cholecalciferol (VITAMIN D3 PO) Take by mouth.    . Cyanocobalamin (VITAMIN B-12 PO) Take by mouth.    . diphenhydramine-acetaminophen (TYLENOL PM) 25-500 MG TABS Take 1 tablet by mouth at bedtime as needed (sleep).     . Flaxseed, Linseed, (GNP FLAXSEED PO) Take by mouth daily in the afternoon.    Marland Kitchen GARLIC PO Take by mouth.    . Ginger, Zingiber officinalis, (GINGER ROOT PO) Take 1 capsule by mouth 1 day or 1  dose.    . Ibuprofen-diphenhydrAMINE Cit (ADVIL PM PO) Take by mouth.    . Menaquinone-7 (VITAMIN K2 PO) Take by mouth.    . Multiple Minerals (CALCIUM/MAGNESIUM/ZINC PO) Take by mouth.    . Multiple Vitamins-Minerals (WOMENS MULTIVITAMIN PO) Take by mouth.    . Nutritional Supplements (ESTROVEN PO) Take by mouth.    . Potassium 99 MG TABS Take 99 mg by mouth daily.    . promethazine-dextromethorphan (PROMETHAZINE-DM) 6.25-15 MG/5ML syrup Take 5 mLs by mouth at bedtime as needed for cough (up to every 4-6 hours). 118 mL 0  . Turmeric (QC TUMERIC COMPLEX PO) Take by mouth.     No current facility-administered medications for this visit.     Known medication allergies: Allergies  Allergen Reactions  . Amoxicillin Diarrhea  . Coconut Oil Itching  . Codeine Itching  . Magnesium-Containing Compounds Itching  . Penicillins Rash    Has patient had a PCN reaction causing immediate rash, facial/tongue/throat swelling, SOB or lightheadedness with hypotension: No Has patient had a PCN reaction causing severe rash involving mucus membranes or skin necrosis: No Has patient had a PCN reaction that required hospitalization: No Has patient had a PCN reaction occurring within the last 10 years: No If all of the above answers are "NO", then may proceed with Cephalosporin use.   . Vitamin E-Safflower Oil Itching     Physical examination: Blood pressure 114/78.  General: Alert, interactive, in no acute distress. HEENT: PERRLA, TMs pearly gray, turbinates non-edematous without discharge, post-pharynx non erythematous. Neck: Supple without lymphadenopathy. Lungs: Clear to auscultation without wheezing, rhonchi or rales. {no increased work of breathing. CV: Normal S1, S2 without murmurs. Abdomen: Nondistended, nontender. Skin: Warm and dry, without lesions or rashes. Extremities:  No clubbing, cyanosis or edema. Neuro:   Grossly intact.  Diagnositics/Labs:  Spirometry: FEV1: 1.54L 64%, FVC:  2.74L 93% predicted.  Status post albuterol she had a significant improvement in FEV1 to no 1.97 L or 82% which normalized.  Assessment and plan:   Cough variant asthma -You may have developed cough variant asthma triggered by bronchitis -You have had improvement in symptoms with the use of prednisone as well as the albuterol inhaler which are all consistent with asthma/reactive airway -Your breathing test today did show evidence of obstruction which is also consistent with asthma/reactive airway.  Your breathing tests normalized after use of albuterol which confirms an asthma diagnosis at this time -Recommend starting a maintenance inhaler medication: Qvar 80 mcg 2 puffs twice a day -Have access to albuterol inhaler 2 puffs every 4-6 hours as needed for cough/wheeze/shortness of breath/chest tightness.  May use 15-20 minutes prior to activity.   Monitor frequency of use.   -Recommend you have the chest x-ray  done that was ordered by your PCP -Will obtain environmental allergy panel today  Control goals:   Full participation in all desired activities (may need albuterol before activity)  Albuterol use two time or less a week on average (not counting use with activity)  Cough interfering with sleep two time or less a month  Oral steroids no more than once a year  No hospitalizations   Follow-up in 2 to 3 months or sooner if needed  I appreciate the opportunity to take part in [facility] care. Please do not hesitate to contact me with questions.  Sincerely,   Prudy Feeler, MD Allergy/Immunology [facility]

## 2020-08-07 LAB — W001-IGE RAGWEED, SHORT: Ragweed, Short IgE: <0.1 kU/L

## 2020-08-07 LAB — D002-IGE D FARINAE: D Farinae IgE: <0.1 kU/L

## 2020-08-07 LAB — T003-IGE COMMON SILVER BIRCH: Common Silver Birch IgE: <0.1 kU/L

## 2020-08-07 LAB — IGE: IgE (Immunoglobulin E), Serum: 10 IU/mL (ref 6–495)

## 2020-08-07 LAB — M006-IGE ALTERNARIA ALTERNATA: Alternaria Alternata IgE: <0.1 kU/L

## 2020-08-07 LAB — I006-IGE COCKROACH, GERMAN: Cockroach, German IgE: <0.1 kU/L

## 2020-08-07 LAB — G010-IGE JOHNSON GRASS: Johnson Grass IgE: <0.1 kU/L

## 2020-08-07 LAB — M003-IGE ASPERGILLUS FUMIGATUS: Aspergillus Fumigatus IgE: <0.1 kU/L

## 2020-08-07 LAB — G006-IGE TIMOTHY GRASS: Timothy Grass IgE: <0.1 kU/L

## 2020-08-07 LAB — M002-IGE CLADOSPORIUM HERBARUM: Cladosporium Herbarum IgE: <0.1 kU/L

## 2020-08-07 LAB — ALLERGEN, CAT DANDER, E1: Cat Dander IgE: <0.1 kU/L

## 2020-08-07 LAB — G002-IGE BERMUDA GRASS: Bermuda Grass IgE: <0.1 kU/L

## 2020-08-07 LAB — M001-IGE PENICILLIUM CHRYSOGEN: Penicillium Chrysogen IgE: <0.1 kU/L

## 2020-08-07 LAB — D001-IGE D PTERONYSSINUS: D Pteronyssinus IgE: <0.1 kU/L

## 2020-08-13 ENCOUNTER — Other Ambulatory Visit: Payer: Self-pay | Admitting: Internal Medicine

## 2020-08-24 ENCOUNTER — Other Ambulatory Visit: Payer: Self-pay

## 2020-08-24 NOTE — Progress Notes (Signed)
Chief Complaint  Patient presents with  . Annual Exam    HPI: Patient  Stephanie Hoffman  55 y.o. comes in today for Preventive Health Care visit  Having hot flushes  Night  At times diruptive  lmp in July skipping  No other sx  provive  In day and at night   2 remafemin    Health Maintenance  Topic Date Due  . Hepatitis C Screening  Never done  . PAP SMEAR-Modifier  08/08/2020  . MAMMOGRAM  07/29/2021  . TETANUS/TDAP  09/25/2023  . COLONOSCOPY (Pts 45-15yrs Insurance coverage will need to be confirmed)  04/30/2025  . INFLUENZA VACCINE  Completed  . COVID-19 Vaccine  Completed  . 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 GI /GU: No adominal pain, vomiting, change in bowel habits. No blood in the stool. No significant GU symptoms. SKIN/HEME: ,no acute skin rashes suspicious lesions or bleeding. No lymphadenopathy, nodules, masses.  NEURO/ PSYCH:  No neurologic signs such as weakness numbness. No depression anxiety. IMM/ Allergy: No unusual infections.  Allergy .   REST of 12 system review negative except as per HPI   Past Medical History:  Diagnosis Date  . Angio-edema   . Gallstones    removed 2000  . Headache(784.0)   . Heart murmur   . Hyperlipidemia   . Pneumonia   . Recurrent upper respiratory infection (URI)   . Urticaria   . Varicose veins     Past Surgical History:  Procedure Laterality Date  . CESAREAN SECTION    . CHOLECYSTECTOMY N/A 2007  . COLONOSCOPY  01/27/2015   Dr.Gessner  . KNEE SURGERY      left torn ligament 2007  . SHOULDER SURGERY     right and left  . WISDOM TOOTH EXTRACTION Bilateral 1997    Family History  Problem Relation Age of Onset  . Arthritis Mother   . Diabetes Mother   . Hyperlipidemia Mother   . Heart disease Mother   . Colon cancer Mother 20  . Hypertension Father   . Prostate cancer Maternal Grandfather 80  . Stroke Paternal Grandfather   . Stomach cancer Maternal Grandmother 79  . Esophageal cancer Neg Hx   . Rectal cancer Neg Hx   . Colon polyps Neg Hx     Social History   Socioeconomic History  . Marital status: Married    Spouse name: Not on file  . Number of children: Not on file  . Years of education: Not on file  . Highest education level: Not on file  Occupational History  . Not on file  Tobacco Use  . Smoking status: Never Smoker  . Smokeless tobacco: Never Used  Vaping Use  . Vaping Use: Never used  Substance and Sexual Activity  . Alcohol use: No  . Drug use: No  . Sexual activity: Yes    Comment: husband had vastectomy  Other Topics Concern  . 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 albuterol (VENTOLIN HFA) 108 (90 Base) MCG/ACT inhaler Inhale 2 puffs into the lungs every 6 (six) hours as needed for wheezing (tight cough  at night). 1 each 1  . albuterol (VENTOLIN HFA) 108 (90 Base) MCG/ACT inhaler Inhale 2 puffs into the lungs every 6 (six) hours as needed for wheezing or shortness of breath. 18 g 1  . beclomethasone (QVAR REDIHALER) 80 MCG/ACT inhaler Inhale 2 puffs into the  lungs 2 (two) times daily. 1 each 5  . benzonatate (TESSALON) 100 MG capsule Take 1 capsule (100 mg total) by mouth 3 (three) times daily as needed for cough (in day). 21 capsule 0  . BIOTIN 5000 PO Take 10,000 mg by mouth daily.    Renard Hamper Cohosh (REMIFEMIN PO) Take by mouth.    . Cholecalciferol (VITAMIN D3 PO) Take by mouth.    . Cyanocobalamin (VITAMIN B-12 PO) Take by mouth.    . diphenhydramine-acetaminophen (TYLENOL PM) 25-500 MG TABS Take 1 tablet by mouth at bedtime as needed (sleep).     . Flaxseed, Linseed, (GNP FLAXSEED PO) Take by mouth daily in the afternoon.    Marland Kitchen GARLIC PO Take by mouth.    . Ginger, Zingiber officinalis, (GINGER ROOT PO) Take 1 capsule by mouth 1 day or 1 dose.    . Ibuprofen-diphenhydrAMINE Cit (ADVIL PM PO) Take by mouth.    . Menaquinone-7 (VITAMIN K2 PO) Take by mouth.    . Multiple Minerals (CALCIUM/MAGNESIUM/ZINC PO) Take by mouth.    . Multiple Vitamins-Minerals (WOMENS MULTIVITAMIN PO) Take by mouth.    . Nutritional Supplements (ESTROVEN PO) Take by mouth.    . Potassium 99 MG TABS Take 99 mg by mouth daily.    . promethazine-dextromethorphan (PROMETHAZINE-DM) 6.25-15 MG/5ML syrup Take 5 mLs by mouth at bedtime as needed for cough (up to every 4-6 hours). 118 mL 0  . Turmeric (QC TUMERIC COMPLEX PO) Take by mouth.     No facility-administered medications prior to visit.     EXAM:  BP 118/80   Pulse 76   Temp 98.8 F (37.1 C) (Oral)   Ht 5\' 1"  (1.549 m)   Wt 108 lb 6.4 oz (49.2 kg)   SpO2 99%   BMI 20.48 kg/m   Body mass index is 20.48 kg/m. Wt Readings from Last 3 Encounters:  08/25/20 108 lb 6.4 oz (49.2 kg)  08/04/20 112 lb 6.4 oz (51 kg)  06/23/20 110 lb (49.9 kg)    Physical Exam: Vital signs reviewed [id number] is a well-developed well-nourished alert cooperative    who appearsr stated age in no acute distress.  HEENT: normocephalic atraumatic , Eyes: PERRL EOM's full, conjunctiva clear,  tenderness., Ears: no deformity EAC's  clear TMs with normal landmarks. Mouth: masked NECK: supple without masses, thyromegaly or bruits. CHEST/PULM:  Clear to auscultation and percussion breath sounds equal no wheeze , rales or rhonchi. No chest wall deformities or tenderness. Breast: normal by inspection . No dimpling, discharge, masses, tenderness  or discharge . CV: PMI is nondisplaced, S1 S2 no gallops, murmurs, rubs. Peripheral pulses are full without delay.No JVD .  ABDOMEN: Bowel sounds normal nontender  No guard or rebound, no hepato splenomegal no CVA tenderness.  No hernia. Extremtities:  No clubbing cyanosis or edema, no acute joint swelling or redness no focal atrophy NEURO:  Oriented x3, cranial nerves 3-12 appear to be intact, no obvious focal weakness,gait within normal limits no abnormal reflexes or asymmetrical SKIN: No acute rashes normal turgor, color, no bruising or petechiae. PSYCH: Oriented, good eye contact, no obvious depression anxiety, cognition and judgment appear normal. LN: no cervical axillary inguinal adenopathy  Lab Results  Component Value Date   WBC 5.7 08/25/2020   HGB 13.0 08/25/2020   HCT 37.9 08/25/2020   PLT 243.0 08/25/2020   GLUCOSE 91 08/25/2020   CHOL 224 (H) 08/25/2020   TRIG 67.0 08/25/2020   HDL 81.00 08/25/2020   LDLDIRECT 136.9 08/27/2013   LDLCALC 129 (H) 08/25/2020   ALT 10 08/25/2020   AST 13 08/25/2020   NA 141 08/25/2020   K 3.8 08/25/2020   CL 104 08/25/2020   CREATININE 0.80 08/25/2020   BUN 18 08/25/2020   CO2 31 08/25/2020   TSH 1.81 08/25/2020   HGBA1C 5.9 08/25/2020   MICROALBUR 1.0 07/17/2016    BP Readings from Last 3 Encounters:  08/25/20 118/80  08/04/20 114/78  04/30/20 135/77    Lab plan  reviewed with patient   ASSESSMENT AND PLAN:  Discussed the following assessment and plan:    ICD-10-CM   1. Visit for preventive health examination  Z00.00 Hemoglobin A1c    CBC with Differential/Platelet    Basic metabolic panel    Hepatic function  panel    Lipid panel    TSH    TSH    Lipid panel    Hepatic function panel    CBC with Differential/Platelet    Hemoglobin 123456    Basic metabolic panel  2. Hyperlipidemia, unspecified hyperlipidemia type  E78.5 Hemoglobin A1c    CBC with Differential/Platelet    Basic metabolic panel    Hepatic function panel    Lipid panel    TSH    TSH    Lipid panel    Hepatic function panel    CBC with Differential/Platelet    Hemoglobin 123456    Basic metabolic panel  3. Family history of diabetes mellitus  Z83.3 Hemoglobin A1c    CBC with Differential/Platelet    Basic metabolic panel    Hepatic function panel    Lipid panel    TSH    TSH    Lipid panel    Hepatic function panel    CBC with Differential/Platelet    Hemoglobin 123456    Basic metabolic panel  4. Hot flushes, perimenopausal  N95.1 Hemoglobin A1c    CBC with Differential/Platelet    Basic metabolic panel    Hepatic function panel    Lipid panel    TSH    TSH    Lipid panel    Hepatic function panel    CBC with Differential/Platelet    Hemoglobin 123456    Basic metabolic panel  5. Need for hepatitis C screening test  Z11.59 Hepatitis C antibody    Hepatitis C antibody  6. Need for immunization against influenza  Z23 Flu Vaccine QUAD 36+ mos IM  reviewed  Menopausal sx and  Sx mitigation  If needed getting some better  fam hx of  dm  Return in about 1 year (around 08/25/2021) for cpx  or as needed shingrix as discussed .  Patient Care Team: Jajaira Ruis, Standley Brooking, MD as PCP - Loni Beckwith, MD (Orthopedic Surgery) Milly Jakob, MD as Consulting Physician (Orthopedic Surgery) Hennie Duos, MD as Consulting Physician (Rheumatology) Patient Instructions   Continue lifestyle intervention healthy eating and exercise . And to help with menopausal sx .   Will notify you  of labs when available.   Shingrix vaccine  When convenient .  Last pap was nl and neg hpv  2019  Can repeat in 5 years due 2024 or earlier     Health Maintenance, Female Adopting a healthy lifestyle and getting preventive care are important in promoting health and wellness. Ask your health care provider about:  The right schedule for you to have regular tests and exams.  Things you can do on your own to prevent diseases and keep yourself healthy. What should I know about diet, weight, and exercise? Eat a healthy diet  Eat a diet that includes plenty of vegetables, fruits, low-fat dairy products, and lean protein.  Do not eat a lot of foods that are high in solid fats, added sugars, or sodium.   Maintain a healthy weight Body mass index (BMI) is used to identify weight problems. It estimates body fat based on height and weight. Your health care provider can help determine your BMI and help you achieve or maintain a healthy weight. Get regular exercise Get regular exercise. This is one of the most important things you can do for your health. Most adults should:  Exercise for at least 150 minutes each week. The exercise should increase your heart rate and make you sweat (moderate-intensity exercise).  Do strengthening exercises at least twice a week. This is in addition to the moderate-intensity exercise.  Spend less time sitting. Even light physical activity can be beneficial. Watch cholesterol and blood lipids Have your blood tested for lipids and cholesterol at 55 years of age, then have this test every 5 years. Have your cholesterol levels checked more often if:  Your lipid or cholesterol levels are high.  You are older than 55 years of age.  You are at high risk for heart disease. What should I know about cancer screening? Depending on your health history and family history, you may need to have cancer screening at various ages. This may include screening for:  Breast cancer.  Cervical cancer.  Colorectal cancer.  Skin cancer.  Lung cancer. What should I know about heart disease, diabetes, and high blood  pressure? Blood pressure and heart disease  High blood pressure causes heart disease and increases the risk of stroke. This is more likely to develop in people who have high blood pressure readings, are of African descent, or are overweight.  Have your blood pressure checked: ? Every 3-5 years if you are 22-4 years of age. ? Every year if you are 43 years old or older. Diabetes Have regular diabetes screenings. This checks your fasting blood sugar level. Have the screening done:  Once every three years after age 78 if you are at a normal weight and have a low risk for diabetes.  More often and at a younger age if you are overweight or have a high risk for diabetes. What should I know about preventing infection? Hepatitis B If you have a higher risk for hepatitis B, you should be screened for this virus. Talk with your health care provider  to find out if you are at risk for hepatitis B infection. Hepatitis C Testing is recommended for:  Everyone born from [birth year] through 1965.  Anyone with known risk factors for hepatitis C. Sexually transmitted infections (STIs)  Get screened for STIs, including gonorrhea and chlamydia, if: ? You are sexually active and are younger than 55 years of age. ? You are older than 55 years of age and your health care provider tells you that you are at risk for this type of infection. ? Your sexual activity has changed since you were last screened, and you are at increased risk for chlamydia or gonorrhea. Ask your health care provider if you are at risk.  Ask your health care provider about whether you are at high risk for HIV. Your health care provider may recommend a prescription medicine to help prevent HIV infection. If you choose to take medicine to prevent HIV, you should first get tested for HIV. You should then be tested every 3 months for as long as you are taking the medicine. Pregnancy  If you are about to stop having your period (premenopausal) and  you may become pregnant, seek counseling before you get pregnant.  Take 400 to 800 micrograms (mcg) of folic acid every day if you become pregnant.  Ask for birth control (contraception) if you want to prevent pregnancy. Osteoporosis and menopause Osteoporosis is a disease in which the bones lose minerals and strength with aging. This can result in bone fractures. If you are 63 years old or older, or if you are at risk for osteoporosis and fractures, ask your health care provider if you should:  Be screened for bone loss.  Take a calcium or vitamin D supplement to lower your risk of fractures.  Be given hormone replacement therapy (HRT) to treat symptoms of menopause. Follow these instructions at home: Lifestyle  Do not use any products that contain nicotine or tobacco, such as cigarettes, e-cigarettes, and chewing tobacco. If you need help quitting, ask your health care provider.  Do not use street drugs.  Do not share needles.  Ask your health care provider for help if you need support or information about quitting drugs. Alcohol use  Do not drink alcohol if: ? Your health care provider tells you not to drink. ? You are pregnant, may be pregnant, or are planning to become pregnant.  If you drink alcohol: ? Limit how much you use to 0-1 drink a day. ? Limit intake if you are breastfeeding.  Be aware of how much alcohol is in your drink. In the [place] one drink equals one 12 oz bottle of beer (355 mL), one 5 oz glass of wine (148 mL), or one 1 oz glass of hard liquor (44 mL). General instructions  Schedule regular health, dental, and eye exams.  Stay current with your vaccines.  Tell your health care provider if: ? You often feel depressed. ? You have ever been abused or do not feel safe at home. Summary  Adopting a healthy lifestyle and getting preventive care are important in promoting health and wellness.  Follow your health care provider's instructions about healthy  diet, exercising, and getting tested or screened for diseases.  Follow your health care provider's instructions on monitoring your cholesterol and blood pressure. This information is not intended to replace advice given to you by your health care provider. Make sure you discuss any questions you have with your health care provider. Document Revised: 07/10/2018 Document Reviewed: 07/10/2018 Elsevier Patient  Education  2021 [place] Ernie Kasler M.D.

## 2020-08-25 ENCOUNTER — Encounter: Payer: Self-pay | Admitting: Internal Medicine

## 2020-08-25 ENCOUNTER — Ambulatory Visit (INDEPENDENT_AMBULATORY_CARE_PROVIDER_SITE_OTHER): Payer: No Typology Code available for payment source | Admitting: Internal Medicine

## 2020-08-25 VITALS — BP 118/80 | HR 76 | Temp 98.8°F | Ht 61.0 in | Wt 108.4 lb

## 2020-08-25 DIAGNOSIS — E785 Hyperlipidemia, unspecified: Secondary | ICD-10-CM

## 2020-08-25 DIAGNOSIS — Z0000 Encounter for general adult medical examination without abnormal findings: Secondary | ICD-10-CM | POA: Diagnosis not present

## 2020-08-25 DIAGNOSIS — Z833 Family history of diabetes mellitus: Secondary | ICD-10-CM | POA: Diagnosis not present

## 2020-08-25 DIAGNOSIS — N951 Menopausal and female climacteric states: Secondary | ICD-10-CM

## 2020-08-25 DIAGNOSIS — Z23 Encounter for immunization: Secondary | ICD-10-CM

## 2020-08-25 DIAGNOSIS — Z1159 Encounter for screening for other viral diseases: Secondary | ICD-10-CM

## 2020-08-25 LAB — CBC WITH DIFFERENTIAL/PLATELET
Basophils Absolute: 0.1 10*3/uL (ref 0.0–0.1)
Basophils Relative: 0.9 % (ref 0.0–3.0)
Eosinophils Absolute: 0 10*3/uL (ref 0.0–0.7)
Eosinophils Relative: 0.8 % (ref 0.0–5.0)
HCT: 37.9 % (ref 36.0–46.0)
Hemoglobin: 13 g/dL (ref 12.0–15.0)
Lymphocytes Relative: 25.5 % (ref 12.0–46.0)
Lymphs Abs: 1.5 10*3/uL (ref 0.7–4.0)
MCHC: 34.3 g/dL (ref 30.0–36.0)
MCV: 87.2 fl (ref 78.0–100.0)
Monocytes Absolute: 0.4 10*3/uL (ref 0.1–1.0)
Monocytes Relative: 7.6 % (ref 3.0–12.0)
Neutro Abs: 3.7 10*3/uL (ref 1.4–7.7)
Neutrophils Relative %: 65.2 % (ref 43.0–77.0)
Platelets: 243 10*3/uL (ref 150.0–400.0)
RBC: 4.35 Mil/uL (ref 3.87–5.11)
RDW: 13.3 % (ref 11.5–15.5)
WBC: 5.7 10*3/uL (ref 4.0–10.5)

## 2020-08-25 LAB — TSH: TSH: 1.81 u[IU]/mL (ref 0.35–4.50)

## 2020-08-25 LAB — BASIC METABOLIC PANEL
BUN: 18 mg/dL (ref 6–23)
CO2: 31 mEq/L (ref 19–32)
Calcium: 9.9 mg/dL (ref 8.4–10.5)
Chloride: 104 mEq/L (ref 96–112)
Creatinine, Ser: 0.8 mg/dL (ref 0.40–1.20)
GFR: 83.39 mL/min (ref >60.00)
Glucose, Bld: 91 mg/dL (ref 70–99)
Potassium: 3.8 mEq/L (ref 3.5–5.1)
Sodium: 141 mEq/L (ref 135–145)

## 2020-08-25 LAB — HEPATIC FUNCTION PANEL
ALT: 10 U/L (ref 0–35)
AST: 13 U/L (ref 0–37)
Albumin: 4.5 g/dL (ref 3.5–5.2)
Alkaline Phosphatase: 59 U/L (ref 39–117)
Bilirubin, Direct: 0.1 mg/dL (ref 0.0–0.3)
Total Bilirubin: 0.5 mg/dL (ref 0.2–1.2)
Total Protein: 6.9 g/dL (ref 6.0–8.3)

## 2020-08-25 LAB — LIPID PANEL
Cholesterol: 224 mg/dL — ABNORMAL HIGH (ref 0–200)
HDL: 81 mg/dL (ref >39.00)
LDL Cholesterol: 129 mg/dL — ABNORMAL HIGH (ref 0–99)
NonHDL: 142.79
Total CHOL/HDL Ratio: 3
Triglycerides: 67 mg/dL (ref 0.0–149.0)
VLDL: 13.4 mg/dL (ref 0.0–40.0)

## 2020-08-25 LAB — HEMOGLOBIN A1C: Hgb A1c MFr Bld: 5.9 % (ref 4.6–6.5)

## 2020-08-25 NOTE — Patient Instructions (Signed)
Continue lifestyle intervention healthy eating and exercise . And to help with menopausal sx .   Will notify you  of labs when available.   Shingrix vaccine  When convenient .  Last pap was nl and neg hpv  2019  Can repeat in 5 years due 2024 or earlier    Health Maintenance, Female Adopting a healthy lifestyle and getting preventive care are important in promoting health and wellness. Ask your health care provider about:  The right schedule for you to have regular tests and exams.  Things you can do on your own to prevent diseases and keep yourself healthy. What should I know about diet, weight, and exercise? Eat a healthy diet  Eat a diet that includes plenty of vegetables, fruits, low-fat dairy products, and lean protein.  Do not eat a lot of foods that are high in solid fats, added sugars, or sodium.   Maintain a healthy weight Body mass index (BMI) is used to identify weight problems. It estimates body fat based on height and weight. Your health care provider can help determine your BMI and help you achieve or maintain a healthy weight. Get regular exercise Get regular exercise. This is one of the most important things you can do for your health. Most adults should:  Exercise for at least 150 minutes each week. The exercise should increase your heart rate and make you sweat (moderate-intensity exercise).  Do strengthening exercises at least twice a week. This is in addition to the moderate-intensity exercise.  Spend less time sitting. Even light physical activity can be beneficial. Watch cholesterol and blood lipids Have your blood tested for lipids and cholesterol at 55 years of age, then have this test every 5 years. Have your cholesterol levels checked more often if:  Your lipid or cholesterol levels are high.  You are older than 55 years of age.  You are at high risk for heart disease. What should I know about cancer screening? Depending on your health history and  family history, you may need to have cancer screening at various ages. This may include screening for:  Breast cancer.  Cervical cancer.  Colorectal cancer.  Skin cancer.  Lung cancer. What should I know about heart disease, diabetes, and high blood pressure? Blood pressure and heart disease  High blood pressure causes heart disease and increases the risk of stroke. This is more likely to develop in people who have high blood pressure readings, are of African descent, or are overweight.  Have your blood pressure checked: ? Every 3-5 years if you are 54-28 years of age. ? Every year if you are 36 years old or older. Diabetes Have regular diabetes screenings. This checks your fasting blood sugar level. Have the screening done:  Once every three years after age 2 if you are at a normal weight and have a low risk for diabetes.  More often and at a younger age if you are overweight or have a high risk for diabetes. What should I know about preventing infection? Hepatitis B If you have a higher risk for hepatitis B, you should be screened for this virus. Talk with your health care provider to find out if you are at risk for hepatitis B infection. Hepatitis C Testing is recommended for:  Everyone born from [birth year] through 1965.  Anyone with known risk factors for hepatitis C. Sexually transmitted infections (STIs)  Get screened for STIs, including gonorrhea and chlamydia, if: ? You are sexually active and are younger than  55 years of age. ? You are older than 55 years of age and your health care provider tells you that you are at risk for this type of infection. ? Your sexual activity has changed since you were last screened, and you are at increased risk for chlamydia or gonorrhea. Ask your health care provider if you are at risk.  Ask your health care provider about whether you are at high risk for HIV. Your health care provider may recommend a prescription medicine to help prevent  HIV infection. If you choose to take medicine to prevent HIV, you should first get tested for HIV. You should then be tested every 3 months for as long as you are taking the medicine. Pregnancy  If you are about to stop having your period (premenopausal) and you may become pregnant, seek counseling before you get pregnant.  Take 400 to 800 micrograms (mcg) of folic acid every day if you become pregnant.  Ask for birth control (contraception) if you want to prevent pregnancy. Osteoporosis and menopause Osteoporosis is a disease in which the bones lose minerals and strength with aging. This can result in bone fractures. If you are 60 years old or older, or if you are at risk for osteoporosis and fractures, ask your health care provider if you should:  Be screened for bone loss.  Take a calcium or vitamin D supplement to lower your risk of fractures.  Be given hormone replacement therapy (HRT) to treat symptoms of menopause. Follow these instructions at home: Lifestyle  Do not use any products that contain nicotine or tobacco, such as cigarettes, e-cigarettes, and chewing tobacco. If you need help quitting, ask your health care provider.  Do not use street drugs.  Do not share needles.  Ask your health care provider for help if you need support or information about quitting drugs. Alcohol use  Do not drink alcohol if: ? Your health care provider tells you not to drink. ? You are pregnant, may be pregnant, or are planning to become pregnant.  If you drink alcohol: ? Limit how much you use to 0-1 drink a day. ? Limit intake if you are breastfeeding.  Be aware of how much alcohol is in your drink. In the [place] one drink equals one 12 oz bottle of beer (355 mL), one 5 oz glass of wine (148 mL), or one 1 oz glass of hard liquor (44 mL). General instructions  Schedule regular health, dental, and eye exams.  Stay current with your vaccines.  Tell your health care provider if: ? You  often feel depressed. ? You have ever been abused or do not feel safe at home. Summary  Adopting a healthy lifestyle and getting preventive care are important in promoting health and wellness.  Follow your health care provider's instructions about healthy diet, exercising, and getting tested or screened for diseases.  Follow your health care provider's instructions on monitoring your cholesterol and blood pressure. This information is not intended to replace advice given to you by your health care provider. Make sure you discuss any questions you have with your health care provider. Document Revised: 07/10/2018 Document Reviewed: 07/10/2018 Elsevier Patient Education  2021 [employer].

## 2020-08-26 LAB — HEPATITIS C ANTIBODY
Hepatitis C Ab: NONREACTIVE
SIGNAL TO CUT-OFF: 0 (ref <1.00)

## 2020-08-29 NOTE — Progress Notes (Signed)
No diabetes  cholesterol better than last . Continue lifestyle intervention healthy eating and exercise .

## 2020-08-30 NOTE — Progress Notes (Signed)
Mychart message sent: No diabetes  cholesterol better than last . Continue lifestyle intervention healthy eating and exercise .

## 2020-10-06 ENCOUNTER — Ambulatory Visit: Payer: No Typology Code available for payment source | Admitting: Allergy

## 2020-10-18 ENCOUNTER — Telehealth: Payer: No Typology Code available for payment source | Admitting: Internal Medicine

## 2020-10-18 NOTE — Telephone Encounter (Signed)
Sorry not feeling well please put her on my video schedule for tomorrow 4:00 would be fine have her do her saline washes and can try Afrin x1 day in the meantime.

## 2020-10-19 ENCOUNTER — Encounter: Payer: Self-pay | Admitting: Internal Medicine

## 2020-10-19 ENCOUNTER — Telehealth (INDEPENDENT_AMBULATORY_CARE_PROVIDER_SITE_OTHER): Payer: No Typology Code available for payment source | Admitting: Internal Medicine

## 2020-10-19 VITALS — Wt 110.0 lb

## 2020-10-19 DIAGNOSIS — J069 Acute upper respiratory infection, unspecified: Secondary | ICD-10-CM | POA: Diagnosis not present

## 2020-10-19 DIAGNOSIS — J0100 Acute maxillary sinusitis, unspecified: Secondary | ICD-10-CM

## 2020-10-19 MED ORDER — DOXYCYCLINE HYCLATE 100 MG PO TABS: 100.0000 mg | ORAL_TABLET | Freq: Two times a day (BID) | ORAL | 0 refills | Status: DC

## 2020-10-19 NOTE — Telephone Encounter (Signed)
Video visit scheduled for 3/22.

## 2020-10-19 NOTE — Progress Notes (Signed)
Virtual Visit via Video Note  I connected with@ on 10/19/20 at  4:00 PM EDT by a video enabled telemedicine application and verified that I am speaking with the correct person using two identifiers. Location patient: home Location provider:work office Persons participating in the virtual visit: patient, provider  [place] national recommendations  regarding COVID 19 pandemic   video visit is advised over in office visit for this patient.  Patient aware  of the limitations of evaluation and management by telemedicine and  availability of in person appointments. and agreed to proceed.   HPI: Stephanie Hoffman presents for video visit onset of 9+ days of upper respiratory symptoms see previous message. Onset was sore throat and then nasal congestion and cough.  Since that time she is taking a number of over-the-counter cold medicines and decongestants with only temporary relief she now has persistent maxillary right more than left tenderness with right more than left nasal discharge now green.  And some tenderness in the glands on the right side of her neck. No fever had a negative Covid test with this illness. Nosebleeds a little bit on the right. She got significant diarrhea with amoxicillin otherwise not allergic to Amoxil     ROS: See pertinent positives and negatives per HPI.  Past Medical History:  Diagnosis Date  . Angio-edema   . Gallstones    removed 2000  . Headache(784.0)   . Heart murmur   . Hyperlipidemia   . Pneumonia   . Recurrent upper respiratory infection (URI)   . Urticaria   . Varicose veins     Past Surgical History:  Procedure Laterality Date  . CESAREAN SECTION    . CHOLECYSTECTOMY N/A 2007  . COLONOSCOPY  01/27/2015   Dr.Gessner  . KNEE SURGERY     left torn ligament 2007  . SHOULDER SURGERY     right and left  . WISDOM TOOTH EXTRACTION Bilateral 1997    Family History  Problem Relation Age of Onset  . Arthritis Mother   . Diabetes Mother   .  Hyperlipidemia Mother   . Heart disease Mother   . Colon cancer Mother 60  . Hypertension Father   . Prostate cancer Maternal Grandfather 80  . Stroke Paternal Grandfather   . Stomach cancer Maternal Grandmother 79  . Esophageal cancer Neg Hx   . Rectal cancer Neg Hx   . Colon polyps Neg Hx     Social History   Tobacco Use  . Smoking status: Never Smoker  . Smokeless tobacco: Never Used  Vaping Use  . Vaping Use: Never used  Substance Use Topics  . Alcohol use: No  . Drug use: No      Current Outpatient Medications:  .  albuterol (VENTOLIN HFA) 108 (90 Base) MCG/ACT inhaler, Inhale 2 puffs into the lungs every 6 (six) hours as needed for wheezing (tight cough  at night)., Disp: 1 each, Rfl: 1 .  albuterol (VENTOLIN HFA) 108 (90 Base) MCG/ACT inhaler, Inhale 2 puffs into the lungs every 6 (six) hours as needed for wheezing or shortness of breath., Disp: 18 g, Rfl: 1 .  BIOTIN 5000 PO, Take 10,000 mg by mouth daily., Disp: , Rfl:  .  Black Cohosh (REMIFEMIN PO), Take by mouth., Disp: , Rfl:  .  Cholecalciferol (VITAMIN D3 PO), Take by mouth., Disp: , Rfl:  .  Cyanocobalamin (VITAMIN B-12 PO), Take by mouth., Disp: , Rfl:  .  diphenhydramine-acetaminophen (TYLENOL PM) 25-500 MG TABS, Take 1 tablet  by mouth at bedtime as needed (sleep). , Disp: , Rfl:  .  doxycycline (VIBRA-TABS) 100 MG tablet, Take 1 tablet (100 mg total) by mouth 2 (two) times daily. For SInusitis, Disp: 14 tablet, Rfl: 0 .  Flaxseed, Linseed, (GNP FLAXSEED PO), Take by mouth daily in the afternoon., Disp: , Rfl:  .  GARLIC PO, Take by mouth., Disp: , Rfl:  .  Ginger, Zingiber officinalis, (GINGER ROOT PO), Take 1 capsule by mouth 1 day or 1 dose., Disp: , Rfl:  .  Ibuprofen-diphenhydrAMINE Cit (ADVIL PM PO), Take by mouth., Disp: , Rfl:  .  Menaquinone-7 (VITAMIN K2 PO), Take by mouth., Disp: , Rfl:  .  Multiple Minerals (CALCIUM/MAGNESIUM/ZINC PO), Take by mouth., Disp: , Rfl:  .  Multiple Vitamins-Minerals  (WOMENS MULTIVITAMIN PO), Take by mouth., Disp: , Rfl:  .  Nutritional Supplements (ESTROVEN PO), Take by mouth., Disp: , Rfl:  .  Potassium 99 MG TABS, Take 99 mg by mouth daily., Disp: , Rfl:  .  promethazine-dextromethorphan (PROMETHAZINE-DM) 6.25-15 MG/5ML syrup, Take 5 mLs by mouth at bedtime as needed for cough (up to every 4-6 hours)., Disp: 118 mL, Rfl: 0 .  Turmeric (QC TUMERIC COMPLEX PO), Take by mouth., Disp: , Rfl:   EXAM: BP Readings from Last 3 Encounters:  08/25/20 118/80  08/04/20 114/78  04/30/20 135/77    VITALS per patient if applicable:  GENERAL: alert, oriented, appears well and in no acute distress she looks congested and irritated right nasal nares.  Speech cognition appears to be normal no facial edema  HEENT: atraumatic, conjunttiva clear, no obvious abnormalities on inspection of external nose and ears  NECK: normal movements of the head and neck  LUNGS: on inspection no signs of respiratory distress, breathing rate appears normal, no obvious gross SOB, gasping or wheezing  CV: no obvious cyanosis  MS: moves all visible extremities without noticeable abnormality  PSYCH/NEURO: pleasant and cooperative, tired under the weather. Lab Results  Component Value Date   WBC 5.7 08/25/2020   HGB 13.0 08/25/2020   HCT 37.9 08/25/2020   PLT 243.0 08/25/2020   GLUCOSE 91 08/25/2020   CHOL 224 (H) 08/25/2020   TRIG 67.0 08/25/2020   HDL 81.00 08/25/2020   LDLDIRECT 136.9 08/27/2013   LDLCALC 129 (H) 08/25/2020   ALT 10 08/25/2020   AST 13 08/25/2020   NA 141 08/25/2020   K 3.8 08/25/2020   CL 104 08/25/2020   CREATININE 0.80 08/25/2020   BUN 18 08/25/2020   CO2 31 08/25/2020   TSH 1.81 08/25/2020   HGBA1C 5.9 08/25/2020   MICROALBUR 1.0 07/17/2016    ASSESSMENT AND PLAN:  Discussed the following assessment and plan:    ICD-10-CM   1. Acute maxillary sinusitis, recurrence not specified  J01.00   2. Protracted URI  J06.9    Upper respiratory  infection with secondary right-sided sinusitis. Continue nasal sinus hygiene add antibiotic doxycycline 101 p.o. twice daily expectant management should be significantly improved in regard to the pain and pressure before the weekend if not contact us for further advice consider switching antibiotic to second line if needed. Counseled.   Expectant management and discussion of plan and treatment with opportunity to ask questions and all were answered. The patient agreed with the plan and demonstrated an understanding of the instructions.   Advised to call back or seek an in-person evaluation if worsening  or having  further concerns . Return if symptoms worsen or fail to improve as expected.  Shanon Ace, MD

## 2021-01-27 NOTE — Telephone Encounter (Signed)
Have her maka in person visit  ( wit neg covid)  Then go from there

## 2021-01-27 NOTE — Telephone Encounter (Signed)
Ok to make appt

## 2021-01-28 ENCOUNTER — Other Ambulatory Visit: Payer: Self-pay | Admitting: Sports Medicine

## 2021-01-28 DIAGNOSIS — M545 Low back pain, unspecified: Secondary | ICD-10-CM

## 2021-02-03 ENCOUNTER — Ambulatory Visit
Admission: RE | Admit: 2021-02-03 | Discharge: 2021-02-03 | Disposition: A | Discharge status: Home / Self Care | Payer: No Typology Code available for payment source | Source: Ambulatory Visit | Attending: Sports Medicine | Admitting: Sports Medicine

## 2021-02-03 DIAGNOSIS — M545 Low back pain, unspecified: Secondary | ICD-10-CM

## 2021-02-04 ENCOUNTER — Ambulatory Visit: Payer: No Typology Code available for payment source | Admitting: Family Medicine

## 2021-02-21 ENCOUNTER — Ambulatory Visit: Payer: No Typology Code available for payment source | Admitting: Internal Medicine

## 2021-02-21 ENCOUNTER — Encounter: Payer: Self-pay | Admitting: Internal Medicine

## 2021-02-21 ENCOUNTER — Other Ambulatory Visit: Payer: Self-pay

## 2021-02-21 VITALS — BP 124/68 | HR 73 | Temp 99.0°F | Ht 61.0 in | Wt 108.0 lb

## 2021-02-21 DIAGNOSIS — K649 Unspecified hemorrhoids: Secondary | ICD-10-CM

## 2021-02-21 MED ORDER — DIBUCAINE (PERIANAL) 1 % EX OINT: 1.0000 "application " | TOPICAL_OINTMENT | CUTANEOUS | 1 refills | Status: DC | PRN

## 2021-02-21 MED ORDER — HYDROCORTISONE ACETATE 25 MG RE SUPP: 25.0000 mg | Freq: Two times a day (BID) | RECTAL | 0 refills | Status: DC

## 2021-02-21 NOTE — Progress Notes (Signed)
Chief Complaint  Patient presents with   Hemorrhoids    Patient complains of hemorrhoids, x3 days, Tried Preparation H with little relief     HPI: Stephanie Hoffman 55 y.o. come in for SDA  appt for problem with hemorrhoids   Last Tuesday had constipation that was relieved and then 3 days ago started to get discomfort in the rectal area worse 2 days ago without bleeding but had a lump.  Is using OTC Preparation H and witch hazel pads.  .  Never had hemorrhoids last colon was clear 2021 No bleeding   trace of blood.  May be initially. Bathroom ok today .   We will to evacuate :pain is on the outside.  Gessnes/ [facility]  ROS: See pertinent positives and negatives per HPI.  Past Medical History:  Diagnosis Date   Angio-edema    Gallstones    removed 2000   Headache(784.0)    Heart murmur    Hyperlipidemia    Pneumonia    Recurrent upper respiratory infection (URI)    Urticaria    Varicose veins     Family History  Problem Relation Age of Onset   Arthritis Mother    Diabetes Mother    Hyperlipidemia Mother    Heart disease Mother    Colon cancer Mother 84   Hypertension Father    Prostate cancer Maternal Grandfather 52   Stroke Paternal Grandfather    Stomach cancer Maternal Grandmother 94   Esophageal cancer Neg Hx    Rectal cancer Neg Hx    Colon polyps Neg Hx     Social History   Socioeconomic History   Marital status: Married    Spouse name: Not on file   Number of children: Not on file   Years of education: Not on file   Highest education level: Not on file  Occupational History   Not on file  Tobacco Use   Smoking status: Never   Smokeless tobacco: Never  Vaping Use   Vaping Use: Never used  Substance and Sexual Activity   Alcohol use: No   Drug use: No   Sexual activity: Yes    Comment: husband had vastectomy  Other Topics Concern   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 1 each 1   albuterol (VENTOLIN HFA) 108 (90 Base) MCG/ACT inhaler Inhale 2 puffs into the lungs every 6 (six) hours as needed for wheezing or shortness of breath. 18 g 1   BIOTIN 5000 PO Take 10,000 mg by mouth daily.     Black Cohosh (REMIFEMIN PO) Take by mouth.     Cholecalciferol (VITAMIN D3 PO) Take by mouth.     Cyanocobalamin (VITAMIN B-12 PO) Take by mouth.     diphenhydramine-acetaminophen (TYLENOL PM) 25-500 MG TABS Take 1  tablet by mouth at bedtime as needed (sleep).      doxycycline (VIBRA-TABS) 100 MG tablet Take 1 tablet (100 mg total) by mouth 2 (two) times daily. For SInusitis 14 tablet 0   Flaxseed, Linseed, (GNP FLAXSEED PO) Take by mouth daily in the afternoon.     GARLIC PO Take by mouth.     Ginger, Zingiber officinalis, (GINGER ROOT PO) Take 1 capsule by mouth 1 day or 1 dose.     Ibuprofen-diphenhydrAMINE Cit (ADVIL PM PO) Take by mouth.     Menaquinone-7 (VITAMIN K2 PO) Take by mouth.     Multiple Minerals (CALCIUM/MAGNESIUM/ZINC PO) Take by mouth.     Multiple Vitamins-Minerals (WOMENS MULTIVITAMIN PO) Take by mouth.     Nutritional Supplements (ESTROVEN PO) Take by mouth.     Potassium 99 MG TABS Take 99 mg by mouth daily.     promethazine-dextromethorphan (PROMETHAZINE-DM) 6.25-15 MG/5ML syrup Take 5 mLs by mouth at bedtime as needed for cough (up to every 4-6 hours). 118 mL 0   Turmeric (QC TUMERIC COMPLEX PO) Take by mouth.     No  facility-administered medications prior to visit.     EXAM:  BP 124/68 (BP Location: Left Arm, Patient Position: Sitting, Cuff Size: Normal)   Pulse 73   Temp 99 F (37.2 C) (Oral)   Ht '5\' 1"'$  (1.549 m)   Wt 108 lb (49 kg)   SpO2 97%   BMI 20.41 kg/m   Body mass index is 20.41 kg/m.  GENERAL: vitals reviewed and listed above, alert, oriented, appears well hydrated and in no acute distress HEENT: atraumatic, conjunctiva  clear, no obvious abnormalities on inspection of external nose and ears OP :masked  NECK: no obvious masses on inspection palpation  Abd soft  Ext GU  6-16m dark pink round hemorrhoid  no ulcer   not tense  but tender not purple.  MS: moves all extremities without noticeable focal  abnormality PSYCH: pleasant and cooperative, no obvious depression or anxiety  BP Readings from Last 3 Encounters:  02/21/21 124/68  08/25/20 118/80  08/04/20 114/78    ASSESSMENT AND PLAN:  Discussed the following assessment and plan:  Acute hemorrhoid Sx rx  planned   do not think procedure will be that helpful  not tense and 3 days into  process.    Expectant management.  And plan   and fu if  persistent or progressive can call tomorrow if worse and referral if needed  Colonoscopy was clear in 2021 -Patient advised to return or notify health care team  if  new concerns arise.  Patient Instructions  This is an acute hemorroid.  Continue with  local  as you are doing and warm sitz baths   Add topical   anesthestic for now.  Should improve over the next 48 hours and if  worse tomorrow [place]  but these should fade over the  next week . Or so .   Hemorrhoids Hemorrhoids are swollen veins in and around the rectum or anus. There are two types of hemorrhoids: Internal hemorrhoids. These occur in the veins that are just inside the rectum. They may poke through to the outside and become irritated and painful. External hemorrhoids. These occur in the veins  that are outside the anus and can be felt as a painful swelling or hard lump near the anus. Most hemorrhoids do not cause serious problems, and they can be managed with home treatments such as diet  and lifestyle changes. If home treatments do nothelp the symptoms, procedures can be done to shrink or remove the hemorrhoids. What are the causes? This condition is caused by increased pressure in the anal area. This pressure may result from various things, including: Constipation. Straining to have a bowel movement. Diarrhea. Pregnancy. Obesity. Sitting for long periods of time. Heavy lifting or other activity that causes you to strain. Anal sex. Riding a bike for a long period of time. What are the signs or symptoms? Symptoms of this condition include: Pain. Anal itching or irritation. Rectal bleeding. Leakage of stool (feces). Anal swelling. One or more lumps around the anus. How is this diagnosed? This condition can often be diagnosed through a visual exam. Other exams or tests may also be done, such as: An exam that involves feeling the rectal area with a gloved hand (digital rectal exam). An exam of the anal canal that is done using a small tube (anoscope). A blood test, if you have lost a significant amount of blood. A test to look inside the colon using a flexible tube with a camera on the end (sigmoidoscopy or colonoscopy). How is this treated? This condition can usually be treated at home. However, various procedures may be done if dietary changes, lifestyle changes, and other home treatments do not help your symptoms. These procedures can help make the hemorrhoids smaller or remove them completely. Some of these procedures involve surgery, and others do not. Common procedures include: Rubber band ligation. Rubber bands are placed at the base of the hemorrhoids to cut off their blood supply. Sclerotherapy. Medicine is injected into the hemorrhoids to shrink them. Infrared  coagulation. A type of light energy is used to get rid of the hemorrhoids. Hemorrhoidectomy surgery. The hemorrhoids are surgically removed, and the veins that supply them are tied off. Stapled hemorrhoidopexy surgery. The surgeon staples the base of the hemorrhoid to the rectal wall. Follow these instructions at home: Eating and drinking  Eat foods that have a lot of fiber in them, such as whole grains, beans, nuts, fruits, and vegetables. Ask your health care provider about taking products that have added fiber (fiber supplements). Reduce the amount of fat in your diet. You can do this by eating low-fat dairy products, eating less red meat, and avoiding processed foods. Drink enough fluid to keep your urine pale yellow.  Managing pain and swelling  Take warm sitz baths for 20 minutes, 3-4 times a day to ease pain and discomfort. You may do this in a bathtub or using a portable sitz bath that fits over the toilet. If directed, apply ice to the affected area. Using ice packs between sitz baths may be helpful. Put ice in a plastic bag. Place a towel between your skin and the bag. Leave the ice on for 20 minutes, 2-3 times a day.  General instructions Take over-the-counter and prescription medicines only as told by your health care provider. Use medicated creams or suppositories as told. Get regular exercise. Ask your health care provider how much and what kind of exercise is best for you. In general, you should do moderate exercise for at least 30 minutes on most days of the week (150 minutes each week). This can include activities such as walking, biking, or yoga. Go to the bathroom when you have the urge to have a bowel movement. Do not wait. Avoid straining to have bowel movements. Keep the anal area dry and clean. Use wet toilet paper or moist towelettes after  a bowel movement. Do not sit on the toilet for long periods of time. This increases blood pooling and pain. Keep all follow-up  visits as told by your health care provider. This is important. Contact a health care provider if you have: Increasing pain and swelling that are not controlled by treatment or medicine. Difficulty having a bowel movement, or you are unable to have a bowel movement. Pain or inflammation outside the area of the hemorrhoids. Get help right away if you have: Uncontrolled bleeding from your rectum. Summary Hemorrhoids are swollen veins in and around the rectum or anus. Most hemorrhoids can be managed with home treatments such as diet and lifestyle changes. Taking warm sitz baths can help ease pain and discomfort. In severe cases, procedures or surgery can be done to shrink or remove the hemorrhoids. This information is not intended to replace advice given to you by your health care provider. Make sure you discuss any questions you have with your healthcare provider. Document Revised: 12/13/2018 Document Reviewed: 12/06/2017 Elsevier Patient Education  2022 [place] Jacinda Kanady M.D.

## 2021-02-21 NOTE — Patient Instructions (Signed)
This is an acute hemorroid.  Continue with  local  as you are doing and warm sitz baths   Add topical   anesthestic for now.  Should improve over the next 48 hours and if  worse tomorrow contact us for poss referral  but these should fade over the  next week . Or so .   Hemorrhoids Hemorrhoids are swollen veins in and around the rectum or anus. There are two types of hemorrhoids: Internal hemorrhoids. These occur in the veins that are just inside the rectum. They may poke through to the outside and become irritated and painful. External hemorrhoids. These occur in the veins that are outside the anus and can be felt as a painful swelling or hard lump near the anus. Most hemorrhoids do not cause serious problems, and they can be managed with home treatments such as diet and lifestyle changes. If home treatments do nothelp the symptoms, procedures can be done to shrink or remove the hemorrhoids. What are the causes? This condition is caused by increased pressure in the anal area. This pressure may result from various things, including: Constipation. Straining to have a bowel movement. Diarrhea. Pregnancy. Obesity. Sitting for long periods of time. Heavy lifting or other activity that causes you to strain. Anal sex. Riding a bike for a long period of time. What are the signs or symptoms? Symptoms of this condition include: Pain. Anal itching or irritation. Rectal bleeding. Leakage of stool (feces). Anal swelling. One or more lumps around the anus. How is this diagnosed? This condition can often be diagnosed through a visual exam. Other exams or tests may also be done, such as: An exam that involves feeling the rectal area with a gloved hand (digital rectal exam). An exam of the anal canal that is done using a small tube (anoscope). A blood test, if you have lost a significant amount of blood. A test to look inside the colon using a flexible tube with a camera on the end (sigmoidoscopy  or colonoscopy). How is this treated? This condition can usually be treated at home. However, various procedures may be done if dietary changes, lifestyle changes, and other home treatments do not help your symptoms. These procedures can help make the hemorrhoids smaller or remove them completely. Some of these procedures involve surgery, and others do not. Common procedures include: Rubber band ligation. Rubber bands are placed at the base of the hemorrhoids to cut off their blood supply. Sclerotherapy. Medicine is injected into the hemorrhoids to shrink them. Infrared coagulation. A type of light energy is used to get rid of the hemorrhoids. Hemorrhoidectomy surgery. The hemorrhoids are surgically removed, and the veins that supply them are tied off. Stapled hemorrhoidopexy surgery. The surgeon staples the base of the hemorrhoid to the rectal wall. Follow these instructions at home: Eating and drinking  Eat foods that have a lot of fiber in them, such as whole grains, beans, nuts, fruits, and vegetables. Ask your health care provider about taking products that have added fiber (fiber supplements). Reduce the amount of fat in your diet. You can do this by eating low-fat dairy products, eating less red meat, and avoiding processed foods. Drink enough fluid to keep your urine pale yellow.  Managing pain and swelling  Take warm sitz baths for 20 minutes, 3-4 times a day to ease pain and discomfort. You may do this in a bathtub or using a portable sitz bath that fits over the toilet. If directed, apply ice to the  affected area. Using ice packs between sitz baths may be helpful. Put ice in a plastic bag. Place a towel between your skin and the bag. Leave the ice on for 20 minutes, 2-3 times a day.  General instructions Take over-the-counter and prescription medicines only as told by your health care provider. Use medicated creams or suppositories as told. Get regular exercise. Ask your health  care provider how much and what kind of exercise is best for you. In general, you should do moderate exercise for at least 30 minutes on most days of the week (150 minutes each week). This can include activities such as walking, biking, or yoga. Go to the bathroom when you have the urge to have a bowel movement. Do not wait. Avoid straining to have bowel movements. Keep the anal area dry and clean. Use wet toilet paper or moist towelettes after a bowel movement. Do not sit on the toilet for long periods of time. This increases blood pooling and pain. Keep all follow-up visits as told by your health care provider. This is important. Contact a health care provider if you have: Increasing pain and swelling that are not controlled by treatment or medicine. Difficulty having a bowel movement, or you are unable to have a bowel movement. Pain or inflammation outside the area of the hemorrhoids. Get help right away if you have: Uncontrolled bleeding from your rectum. Summary Hemorrhoids are swollen veins in and around the rectum or anus. Most hemorrhoids can be managed with home treatments such as diet and lifestyle changes. Taking warm sitz baths can help ease pain and discomfort. In severe cases, procedures or surgery can be done to shrink or remove the hemorrhoids. This information is not intended to replace advice given to you by your health care provider. Make sure you discuss any questions you have with your healthcare provider. Document Revised: 12/13/2018 Document Reviewed: 12/06/2017 Elsevier Patient Education  [place].

## 2021-03-01 ENCOUNTER — Ambulatory Visit: Payer: No Typology Code available for payment source | Admitting: Internal Medicine

## 2021-03-07 ENCOUNTER — Telehealth: Payer: Self-pay | Admitting: Internal Medicine

## 2021-03-07 DIAGNOSIS — K649 Unspecified hemorrhoids: Secondary | ICD-10-CM

## 2021-03-07 NOTE — Telephone Encounter (Signed)
Patient is wanting another late appointment for either 4:00pm or 4:30pm for her hemorrhoid problem.  She says that she has finished the medication and she is still having issues. It is sore to the touch and has not gone away.  Informed patient that Dr. Regis Bill is out of the office this weekend and offered other appointments with other providers. Patient would like to only see Dr. Regis Bill.  Patient's contact number is [phone].  Please advise.

## 2021-03-15 NOTE — Telephone Encounter (Signed)
If still having problems  I suggest we get  GI to see her with  referral . Please advise  how she is doing  I was out of office last week.

## 2021-03-15 NOTE — Telephone Encounter (Signed)
Referral to GI has been placed and pt is aware.

## 2021-03-15 NOTE — Telephone Encounter (Signed)
I spoke with the pt and she reported that her hemorrhoid problem is still ongoing. Pt agreed to be referred to GI.

## 2021-03-15 NOTE — Telephone Encounter (Signed)
Yes please refer

## 2021-04-07 ENCOUNTER — Ambulatory Visit: Payer: No Typology Code available for payment source | Admitting: Family Medicine

## 2021-04-07 ENCOUNTER — Other Ambulatory Visit: Payer: Self-pay

## 2021-04-07 ENCOUNTER — Ambulatory Visit: Payer: Self-pay

## 2021-04-07 VITALS — Ht 61.5 in | Wt 105.0 lb

## 2021-04-07 DIAGNOSIS — D1720 Benign lipomatous neoplasm of skin and subcutaneous tissue of unspecified limb: Secondary | ICD-10-CM | POA: Diagnosis not present

## 2021-04-07 DIAGNOSIS — M5416 Radiculopathy, lumbar region: Secondary | ICD-10-CM | POA: Diagnosis not present

## 2021-04-07 DIAGNOSIS — M67432 Ganglion, left wrist: Secondary | ICD-10-CM

## 2021-04-07 DIAGNOSIS — M23303 Other meniscus derangements, unspecified medial meniscus, right knee: Secondary | ICD-10-CM

## 2021-04-07 MED ORDER — DICLOFENAC SODIUM 2 % EX SOLN: 1.0000 "application " | Freq: Two times a day (BID) | CUTANEOUS | 2 refills | Status: DC

## 2021-04-07 NOTE — Progress Notes (Signed)
Stephanie Hoffman - 55 y.o. female MRN [id number]  Date of birth: 05-29-1966  SUBJECTIVE:  Including CC & ROS.  No chief complaint on file.   Stephanie Hoffman is a 55 y.o. female that is presenting with left mass on forearm, lumbar radiculopathy and right knee pain.   Review of Systems See HPI   HISTORY: Past Medical, Surgical, Social, and Family History Reviewed & Updated per EMR.   Pertinent Historical Findings include:  Past Medical History:  Diagnosis Date   Angio-edema    Gallstones    removed 2000   Headache(784.0)    Heart murmur    Hyperlipidemia    Pneumonia    Recurrent upper respiratory infection (URI)    Urticaria    Varicose veins     Past Surgical History:  Procedure Laterality Date   CESAREAN SECTION     CHOLECYSTECTOMY N/A 2007   COLONOSCOPY  01/27/2015   Dr.Gessner   KNEE SURGERY     left torn ligament 2007   SHOULDER SURGERY     right and left   WISDOM TOOTH EXTRACTION Bilateral 1997    Family History  Problem Relation Age of Onset   Arthritis Mother    Diabetes Mother    Hyperlipidemia Mother    Heart disease Mother    Colon cancer Mother 46   Hypertension Father    Prostate cancer Maternal Grandfather 30   Stroke Paternal Grandfather    Stomach cancer Maternal Grandmother 28   Esophageal cancer Neg Hx    Rectal cancer Neg Hx    Colon polyps Neg Hx     Social History   Socioeconomic History   Marital status: Married    Spouse name: Not on file   Number of children: Not on file   Years of education: Not on file   Highest education level: Not on file  Occupational History   Not on file  Tobacco Use   Smoking status: Never   Smokeless tobacco: Never  Vaping Use   Vaping Use: Never used  Substance and Sexual Activity   Alcohol use: No   Drug use: No   Sexual activity: Yes    Comment: husband had vastectomy  Other Topics Concern   Not on file  Social History Narrative   Occupation: post Teacher, 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   Married   Regular exercise- yes   HH of 3 no pet s now    not ets   Social Determinants of Radio broadcast assistant Strain: Not on file  Food Insecurity: Not on file  Transportation Needs: Not on file  Physical Activity: Not on file  Stress: Not on file  Social Connections: Not on file  Intimate Partner Violence: Not on file     PHYSICAL EXAM:  VS: Ht 5' 1.5" (1.562 m)   Wt 105 lb (47.6 kg)   BMI 19.52 kg/m  Physical Exam Gen: NAD, alert, cooperative with exam, well-appearing   Limited ultrasound: Left forearm mass, right knee:  Left forearm mass: There appears to be a heterogenous material within the mass. Is compressible. No hyperemia associated with it.  Right knee: Trace effusion. Normal-appearing quadricep patellar tendon. Mild degenerative changes of the medial meniscus  Summary: Cyst versus mass on forearm.  Degenerative changes of the meniscus of the right knee  Ultrasound and interpretation by Clearance Coots, MD  Aspiration/Injection Procedure Note CLEOFAS HAYNER [birth year]  Procedure: Injection Indications: Left forearm mass  Procedure  Details Consent: Risks of procedure as well as the alternatives and risks of each were explained to the (patient/caregiver).  Consent for procedure obtained. Time Out: Verified patient identification, verified procedure, site/side was marked, verified correct patient position, special equipment/implants available, medications/allergies/relevent history reviewed, required imaging and test results available.  Performed.  The area was cleaned with iodine and alcohol swabs.    The left forearm mass was injected using3 cc's of 1% lidocaine with a 25 1 1/2" needle.  Ultrasound was used.     A sterile dressing was applied.  Patient did tolerate procedure well.      ASSESSMENT & PLAN:   Lumbar radiculopathy Acute on chronic in nature.  Has had similar symptoms in the past.  Does get  improvement with prednisone.  Previous MRI was completed. -Counseled on home exercise therapy and supportive care. -We will pursue epidural injection.  Lipoma of forearm Initially thought to be a ganglion cyst.  Injected the area but was not expanding and more solid in nature.  Likely lipoma given the response to the injection. -Counseled on home exercise therapy and supportive care. -Can refer for removal.  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 -Counseled on home exercise therapy and supportive care. - Pennsaid. -Could consider injection

## 2021-04-07 NOTE — Patient Instructions (Signed)
Good to see you Please try the exercises for the knee  Please try the pennsaid  Once you get the MRI to me, I can order an epidural   Please send me a message in MyChart with any questions or updates.  Please see me back in 4 weeks.   --Dr. Raeford Razor

## 2021-04-07 NOTE — Assessment & Plan Note (Signed)
Has degenerative change appreciated in the knee. -Counseled on home exercise therapy and supportive care. - Pennsaid. -Could consider injection

## 2021-04-07 NOTE — Assessment & Plan Note (Signed)
Initially thought to be a ganglion cyst.  Injected the area but was not expanding and more solid in nature.  Likely lipoma given the response to the injection. -Counseled on home exercise therapy and supportive care. -Can refer for removal.

## 2021-04-07 NOTE — Assessment & Plan Note (Signed)
Acute on chronic in nature.  Has had similar symptoms in the past.  Does get improvement with prednisone.  Previous MRI was completed. -Counseled on home exercise therapy and supportive care. -We will pursue epidural injection.

## 2021-04-13 ENCOUNTER — Other Ambulatory Visit: Payer: Self-pay | Admitting: Family Medicine

## 2021-04-13 ENCOUNTER — Encounter: Payer: Self-pay | Admitting: Family Medicine

## 2021-04-13 DIAGNOSIS — D1720 Benign lipomatous neoplasm of skin and subcutaneous tissue of unspecified limb: Secondary | ICD-10-CM

## 2021-05-09 ENCOUNTER — Ambulatory Visit: Payer: Self-pay

## 2021-05-09 ENCOUNTER — Ambulatory Visit (INDEPENDENT_AMBULATORY_CARE_PROVIDER_SITE_OTHER): Payer: No Typology Code available for payment source | Admitting: Family Medicine

## 2021-05-09 VITALS — Ht 61.5 in | Wt 105.0 lb

## 2021-05-09 DIAGNOSIS — M533 Sacrococcygeal disorders, not elsewhere classified: Secondary | ICD-10-CM

## 2021-05-09 DIAGNOSIS — M23303 Other meniscus derangements, unspecified medial meniscus, right knee: Secondary | ICD-10-CM

## 2021-05-09 MED ORDER — TRIAMCINOLONE ACETONIDE 40 MG/ML IJ SUSP
40.0000 mg | Freq: Once | INTRAMUSCULAR | Status: AC
  Administered 2021-05-09: 40 mg via INTRA_ARTICULAR

## 2021-05-09 NOTE — Progress Notes (Signed)
[name] - 55 y.o. female MRN [id number]  Date of birth: [birth year]  SUBJECTIVE:  Including CC & ROS.  No chief complaint on file.   Stephanie Hoffman is a 55 y.o. female that is presenting today for worsening of her left radicular type pain.  Also has pain at the SI joint where this radicular pain originates.  Review of her imaging showing possible L5-S1 origin of her pain.  Has used Tiger balm for the knee pain.    Review of Systems See HPI   HISTORY: Past Medical, Surgical, Social, and Family History Reviewed & Updated per EMR.   Pertinent Historical Findings include:  Past Medical History:  Diagnosis Date   Angio-edema    Gallstones    removed 2000   Headache(784.0)    Heart murmur    Hyperlipidemia    Pneumonia    Recurrent upper respiratory infection (URI)    Urticaria    Varicose veins     Past Surgical History:  Procedure Laterality Date   CESAREAN SECTION     CHOLECYSTECTOMY N/A 2007   COLONOSCOPY  01/27/2015   Dr.Gessner   KNEE SURGERY     left torn ligament 2007   SHOULDER SURGERY     right and left   WISDOM TOOTH EXTRACTION Bilateral 1997    Family History  Problem Relation Age of Onset   Arthritis Mother    Diabetes Mother    Hyperlipidemia Mother    Heart disease Mother    Colon cancer Mother 38   Hypertension Father    Prostate cancer Maternal Grandfather 50   Stroke Paternal Grandfather    Stomach cancer Maternal Grandmother 47   Esophageal cancer Neg Hx    Rectal cancer Neg Hx    Colon polyps Neg Hx     Social History   Socioeconomic History   Marital status: Married    Spouse name: Not on file   Number of children: Not on file   Years of education: Not on file   Highest education level: Not on file  Occupational History   Not on file  Tobacco Use   Smoking status: Never   Smokeless tobacco: Never  Vaping Use   Vaping Use: Never used  Substance and Sexual Activity   Alcohol use: No   Drug use: No   Sexual  activity: Yes    Comment: husband had vastectomy  Other Topics Concern   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   Married   Regular exercise- yes   HH of 3 no pet s now    not ets   Social Determinants of Radio broadcast assistant Strain: Not on file  Food Insecurity: Not on file  Transportation Needs: Not on file  Physical Activity: Not on file  Stress: Not on file  Social Connections: Not on file  Intimate Partner Violence: Not on file     PHYSICAL EXAM:  VS: Ht 5' 1.5" (1.562 m)   Wt 105 lb (47.6 kg)   BMI 19.52 kg/m  Physical Exam Gen: NAD, alert, cooperative with exam, well-appearing    Aspiration/Injection Procedure Note TACORA ATHANAS 09[birth year]  Procedure: Injection Indications: Left SI joint pain  Procedure Details Consent: Risks of procedure as well as the alternatives and risks of each were explained to the (patient/caregiver).  Consent for procedure obtained. Time Out: Verified patient identification, verified procedure, site/side was marked, verified  correct patient position, special equipment/implants available, medications/allergies/relevent history reviewed, required imaging and test results available.  Performed.  The area was cleaned with iodine and alcohol swabs.    The left SI joint was injected using 3 cc of 1% lidocaine on a 22-gauge 3-1/2 inch needle.  The syringe was switched and fracture containing 1 cc's of 40 mg Kenalog and 4 cc's of 0.25% bupivacaine was injected.  The syringe was seen at its endpoint and ultrasound was needed for proper placement into the joint.  Ultrasound was used. Images were obtained in long views showing the injection.     A sterile dressing was applied.  Patient did tolerate procedure well.      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  Seems more  of a meniscal irritation that is ongoing.  No effusion on exam. -Counseled on home exercise therapy and supportive care. -Counseled on obtaining Pennsaid. -Could consider injection.  Sacroiliac joint dysfunction of left side Previous radicular pain could be associated with the SI joint.  Does have pain on exam over the joint. -Counseled on home exercise therapy and supportive care. -SI joint injection today. -Could consider an epidural

## 2021-05-09 NOTE — Assessment & Plan Note (Signed)
Previous radicular pain could be associated with the SI joint.  Does have pain on exam over the joint. -Counseled on home exercise therapy and supportive care. -SI joint injection today. -Could consider an epidural

## 2021-05-09 NOTE — Assessment & Plan Note (Signed)
Having some pain with flexion unable to kneel on her knee.  Seems more of a meniscal irritation that is ongoing.  No effusion on exam. -Counseled on home exercise therapy and supportive care. -Counseled on obtaining Pennsaid. -Could consider injection.

## 2021-05-09 NOTE — Patient Instructions (Signed)
Good to see you Please check to see if you were contacted about the pennsaid  Please let me know if the pain returns. We could try an epidural going forward.   Please send me a message in MyChart with any questions or updates.  Please see me back in 4 weeks.   --Dr. Raeford Razor

## 2021-06-13 ENCOUNTER — Encounter: Payer: Self-pay | Admitting: Family Medicine

## 2021-06-13 ENCOUNTER — Other Ambulatory Visit: Payer: Self-pay

## 2021-06-13 ENCOUNTER — Ambulatory Visit (INDEPENDENT_AMBULATORY_CARE_PROVIDER_SITE_OTHER): Payer: No Typology Code available for payment source | Admitting: Family Medicine

## 2021-06-13 ENCOUNTER — Ambulatory Visit: Payer: Self-pay

## 2021-06-13 ENCOUNTER — Ambulatory Visit (HOSPITAL_BASED_OUTPATIENT_CLINIC_OR_DEPARTMENT_OTHER)
Admission: RE | Admit: 2021-06-13 | Discharge: 2021-06-13 | Disposition: A | Discharge status: Home / Self Care | Payer: No Typology Code available for payment source | Source: Ambulatory Visit | Attending: Family Medicine | Admitting: Family Medicine

## 2021-06-13 DIAGNOSIS — M23303 Other meniscus derangements, unspecified medial meniscus, right knee: Secondary | ICD-10-CM | POA: Diagnosis present

## 2021-06-13 DIAGNOSIS — M533 Sacrococcygeal disorders, not elsewhere classified: Secondary | ICD-10-CM

## 2021-06-13 MED ORDER — METHYLPREDNISOLONE ACETATE 40 MG/ML IJ SUSP: 40.0000 mg | Freq: Once | INTRAMUSCULAR | Status: DC

## 2021-06-13 MED ORDER — TRIAMCINOLONE ACETONIDE 40 MG/ML IJ SUSP
40.0000 mg | Freq: Once | INTRAMUSCULAR | Status: AC
  Administered 2021-06-13: 40 mg via INTRA_ARTICULAR

## 2021-06-13 NOTE — Patient Instructions (Signed)
Good to see you Please use ice as needed  Please continue the exercises  I will call with the results from today   Please send me a message in MyChart with any questions or updates.  Please see me back in 4-6 weeks.   --Dr. Raeford Razor

## 2021-06-13 NOTE — Progress Notes (Signed)
Stephanie Hoffman - 55 y.o. female MRN [id number]  Date of birth: Dec 19, 1965  SUBJECTIVE:  Including CC & ROS.  No chief complaint on file.   Stephanie Hoffman is a 55 y.o. female that is following up for her SI joint dysfunction and right knee pain.  The SI joint and radicular pain have improved since the injection.  She continues to have ongoing right knee pain.   Review of Systems See HPI   HISTORY: Past Medical, Surgical, Social, and Family History Reviewed & Updated per EMR.   Pertinent Historical Findings include:  Past Medical History:  Diagnosis Date   Angio-edema    Gallstones    removed 2000   Headache(784.0)    Heart murmur    Hyperlipidemia    Pneumonia    Recurrent upper respiratory infection (URI)    Urticaria    Varicose veins     Past Surgical History:  Procedure Laterality Date   CESAREAN SECTION     CHOLECYSTECTOMY N/A 2007   COLONOSCOPY  01/27/2015   Dr.Gessner   KNEE SURGERY     left torn ligament 2007   SHOULDER SURGERY     right and left   WISDOM TOOTH EXTRACTION Bilateral 1997    Family History  Problem Relation Age of Onset   Arthritis Mother    Diabetes Mother    Hyperlipidemia Mother    Heart disease Mother    Colon cancer Mother 33   Hypertension Father    Prostate cancer Maternal Grandfather 17   Stroke Paternal Grandfather    Stomach cancer Maternal Grandmother 24   Esophageal cancer Neg Hx    Rectal cancer Neg Hx    Colon polyps Neg Hx     Social History   Socioeconomic History   Marital status: Married    Spouse name: Not on file   Number of children: Not on file   Years of education: Not on file   Highest education level: Not on file  Occupational History   Not on file  Tobacco Use   Smoking status: Never   Smokeless tobacco: Never  Vaping Use   Vaping Use: Never used  Substance and Sexual Activity   Alcohol use: No   Drug use: No   Sexual activity: Yes    Comment: husband had vastectomy  Other Topics  Concern   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   Married   Regular exercise- yes   HH of 3 no pet s now    not ets   Social Determinants of Radio broadcast assistant Strain: Not on file  Food Insecurity: Not on file  Transportation Needs: Not on file  Physical Activity: Not on file  Stress: Not on file  Social Connections: Not on file  Intimate Partner Violence: Not on file     PHYSICAL EXAM:  VS: BP 140/80 (BP Location: Left Arm, Patient Position: Sitting)   Ht 5' 1.5" (1.562 m)   Wt 105 lb (47.6 kg)   LMP 02/12/2020   BMI 19.52 kg/m  Physical Exam Gen: NAD, alert, cooperative with exam, well-appearing    Aspiration/Injection Procedure Note Stephanie Hoffman July 02, 1966  Procedure: Injection Indications: Right knee pain  Procedure Details Consent: Risks of procedure as well as the alternatives and risks of each were explained to the (patient/caregiver).  Consent for procedure obtained. Time Out: Verified patient identification, verified procedure, site/side was marked,  verified correct patient position, special equipment/implants available, medications/allergies/relevent history reviewed, required imaging and test results available.  Performed.  The area was cleaned with iodine and alcohol swabs.    The right knee superior lateral suprapatellar pouch was injected using 3 cc of 1% lidocaine on a 21-gauge 1-1/2 inch needle.  The syringe was switched and a mixture containing 1 cc's of 40 mg Kenalog and 4 cc's of 0.25% bupivacaine was injected.  Ultrasound was used. Images were obtained in long views showing the injection.     A sterile dressing was applied.  Patient did tolerate procedure well.    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  Mild effusion on exam. -Counseled on home exercise therapy and supportive care. -X-ray - injection today .  -Could consider physical therapy.

## 2021-06-13 NOTE — Assessment & Plan Note (Signed)
No pain since the injection  - counseled on home exercise therapy and supportive care - could consider epidural if needed

## 2021-06-13 NOTE — Assessment & Plan Note (Addendum)
Acutely wornseing.  Mild effusion on exam. -Counseled on home exercise therapy and supportive care. -X-ray - injection today .  -Could consider physical therapy.

## 2021-06-14 ENCOUNTER — Telehealth: Payer: Self-pay | Admitting: Family Medicine

## 2021-06-14 NOTE — Telephone Encounter (Signed)
Informed of results.   Rosemarie Ax, MD [facility] 06/14/2021, 1:34 PM

## 2021-07-26 ENCOUNTER — Ambulatory Visit: Payer: No Typology Code available for payment source | Admitting: Family Medicine

## 2021-08-04 LAB — HM MAMMOGRAPHY

## 2021-08-12 ENCOUNTER — Encounter: Payer: Self-pay | Admitting: Internal Medicine

## 2021-08-28 NOTE — Progress Notes (Signed)
Chief Complaint  Patient presents with   Annual Exam    fasting    HPI: Patient  Stephanie Hoffman  56 y.o. comes in today for Preventive Health Care visit  Doing ok  back is stable has had injections which help  does am routine every am    Health Maintenance  Topic Date Due   PAP SMEAR-Modifier  02/26/2022 (Originally 08/08/2020)   COVID-19 Vaccine (4 - Booster for [place] series) 02/26/2022 (Originally 10/02/2020)   Zoster Vaccines- Shingrix (1 of 2) 02/26/2022 (Originally 12/30/2015)   MAMMOGRAM  08/04/2022   TETANUS/TDAP  09/25/2023   COLONOSCOPY (Pts 45-34yrs Insurance coverage will need to be confirmed)  04/30/2025   INFLUENZA VACCINE  Completed   Hepatitis C Screening  Completed   HIV Screening  Completed   HPV VACCINES  Aged Out   Health Maintenance Review LIFESTYLE:  Exercise:  30 min q am Tobacco/ETS:n Alcohol: n Sugar beverages:n x small oj  Sleep:6-7  Drug use: no HH of 2 no pets  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  sometimes hard to focus concentrate home or work  Production designer, theatre/television/film garlic pills and menopausal teas   Had first shingrix at [facility]  ok sore arm  had flu vaccine nov 30  Last pap 2019 neg hpv    ROS:   REST of 12 system review negative except as per HPI   Past Medical History:  Diagnosis Date   Angio-edema    Gallstones    removed 2000   Headache(784.0)    Heart murmur    Hyperlipidemia    Pneumonia    Recurrent upper respiratory infection (URI)    Urticaria    Varicose veins     Past Surgical History:  Procedure Laterality Date   CESAREAN SECTION     CHOLECYSTECTOMY N/A 2007   COLONOSCOPY  01/27/2015   Dr.Gessner   KNEE SURGERY     left torn ligament 2007   SHOULDER SURGERY     right and left   WISDOM TOOTH EXTRACTION Bilateral 1997    Family History  Problem Relation Age of Onset   Arthritis Mother    Diabetes Mother    Hyperlipidemia Mother    Heart disease Mother    Colon cancer Mother 83   Hypertension Father    Prostate  cancer Maternal Grandfather 64   Stroke Paternal Grandfather    Stomach cancer Maternal Grandmother 79   Esophageal cancer Neg Hx    Rectal cancer Neg Hx    Colon polyps Neg Hx     Social History   Socioeconomic History   Marital status: Married    Spouse name: Not on file   Number of children: Not on file   Years of education: Not on file   Highest education level: Not on file  Occupational History   Not on file  Tobacco Use   Smoking status: Never   Smokeless tobacco: Never  Vaping Use   Vaping Use: Never used  Substance and Sexual Activity   Alcohol use: No   Drug use: No   Sexual activity: Yes    Comment: husband had vastectomy  Other Topics Concern   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     Cyanocobalamin (VITAMIN B-12 PO) Take by mouth.     diphenhydramine-acetaminophen (TYLENOL PM) 25-500 MG TABS Take 1 tablet by mouth at bedtime as needed (sleep).      GARLIC PO Take by mouth.     Ibuprofen-diphenhydrAMINE Cit (ADVIL PM PO) Take by mouth.     Menaquinone-7 (VITAMIN K2 PO) Take by mouth.     Multiple Vitamins-Minerals (WOMENS MULTIVITAMIN PO) Take by mouth.     Nutritional Supplements (ESTROVEN PO) Take by mouth.     Potassium 99 MG TABS Take 99 mg by mouth daily.     Turmeric (QC TUMERIC COMPLEX PO) Take by mouth.     albuterol (VENTOLIN HFA) 108 (90 Base) MCG/ACT inhaler Inhale 2 puffs into the lungs every 6 (six) hours as needed for wheezing (tight cough  at night). 1 each 1    albuterol (VENTOLIN HFA) 108 (90 Base) MCG/ACT inhaler Inhale 2 puffs into the lungs every 6 (six) hours as needed for wheezing or shortness of breath. 18 g 1   BIOTIN 5000 PO Take 10,000 mg by mouth daily.     Black Cohosh (REMIFEMIN PO) Take by mouth.     dibucaine (NUPERCAINAL) 1 % OINT Place 1 application rectally as needed for hemorrhoids. 28 g 1   Diclofenac Sodium (PENNSAID) 2 % SOLN Place 1 application onto the skin 2 (two) times daily. 112 g 2   doxycycline (VIBRA-TABS) 100 MG tablet Take 1 tablet (100 mg total) by mouth 2 (two) times daily. For SInusitis (Patient not taking: Reported on 08/29/2021) 14 tablet 0   Flaxseed, Linseed, (GNP FLAXSEED PO) Take by mouth daily in the afternoon.     Ginger, Zingiber officinalis, (GINGER ROOT PO) Take 1 capsule by mouth 1 day or 1 dose.     hydrocortisone (ANUSOL-HC) 25 MG suppository Place 1 suppository (25 mg total) rectally 2 (two) times daily. 12 suppository 0   Multiple Minerals (CALCIUM/MAGNESIUM/ZINC PO) Take by mouth.     promethazine-dextromethorphan (PROMETHAZINE-DM) 6.25-15 MG/5ML syrup Take 5 mLs by mouth at bedtime as needed for cough (up to every 4-6 hours). 118 mL 0   No facility-administered medications prior to visit.     EXAM:  BP 120/80 (BP Location: Left Arm, Patient Position: Sitting, Cuff Size: Normal)    Pulse 71    Temp 98.8 F (37.1 C) (Oral)    Ht 5' 1.5" (1.562 m)    Wt 102 lb 12.8 oz (46.6 kg)    LMP 02/12/2020    SpO2 100%    BMI 19.11 kg/m   Body mass index is 19.11 kg/m. Wt Readings from Last 3 Encounters:  08/29/21 102 lb 12.8 oz (46.6 kg)  06/13/21 105 lb (47.6 kg)  05/09/21 105 lb (47.6 kg)    Physical Exam: Vital signs reviewed WUJ:WJXB is a well-developed well-nourished alert cooperative    who appearsr stated age in no acute distress.  HEENT: normocephalic atraumatic , Eyes: PERRL EOM's full, conjunctiva clear, Nares: paten,t no deformity discharge or tenderness., Ears: no deformity EAC's clear  TMs with normal landmarks. Mouth:NECK: supple without masses, thyromegaly or bruits. CHEST/PULM:  Clear to auscultation and percussion breath sounds equal no wheeze , rales or  rhonchi. No chest wall deformities or tenderness. Breast: normal by inspection . No dimpling, discharge, masses, tenderness or discharge . CV: PMI is nondisplaced, S1 S2 no gallops, murmurs, rubs. Peripheral pulses are full without delay.No JVD .  ABDOMEN: Bowel sounds normal nontender  No guard or rebound, no hepato splenomegal no CVA tenderness.  Extremtities:  No clubbing cyanosis or edema, no acute joint swelling or redness no focal atrophy NEURO:  Oriented x3, cranial nerves 3-12 appear to be intact, no obvious focal weakness,gait within normal limits no abnormal reflexes or asymmetrical SKIN: No acute rashes normal turgor, color, no bruising or petechiae. PSYCH: Oriented, good eye contact, no obvious depression anxiety, cognition and judgment appear normal. LN: no cervical axillary inguinal adenopathy  Lab Results  Component Value Date   WBC 5.7 08/25/2020   HGB 13.0 08/25/2020   HCT 37.9 08/25/2020   PLT 243.0 08/25/2020   GLUCOSE 91 08/25/2020   CHOL 224 (H) 08/25/2020   TRIG 67.0 08/25/2020   HDL 81.00 08/25/2020   LDLDIRECT 136.9 08/27/2013   LDLCALC 129 (H) 08/25/2020   ALT 10 08/25/2020   AST 13 08/25/2020   NA 141 08/25/2020   K 3.8 08/25/2020   CL 104 08/25/2020   CREATININE 0.80 08/25/2020   BUN 18 08/25/2020   CO2 31 08/25/2020   TSH 1.81 08/25/2020   HGBA1C 5.9 08/25/2020   MICROALBUR 1.0 07/17/2016    BP Readings from Last 3 Encounters:  08/29/21 120/80  06/13/21 140/80  02/21/21 124/68    Lab plan  reviewed with patient  fasting today   ASSESSMENT AND PLAN:  Discussed the following assessment and plan:    ICD-10-CM   1. Visit for preventive health examination  E26.83 Basic metabolic panel    CBC with Differential/Platelet    Hepatic function panel    Lipid panel    TSH     Hemoglobin A1c    Hemoglobin A1c    TSH    Lipid panel    Hepatic function panel    CBC with Differential/Platelet    Basic metabolic panel    2. Hyperlipidemia, unspecified hyperlipidemia type  M19.6 Basic metabolic panel    CBC with Differential/Platelet    Hepatic function panel    Lipid panel    TSH    Hemoglobin A1c    Hemoglobin A1c    TSH    Lipid panel    Hepatic function panel    CBC with Differential/Platelet    Basic metabolic panel    3. Chronic back pain, unspecified back location, unspecified back pain laterality  Q22.9 Basic metabolic panel   N98.92 CBC with Differential/Platelet    Hepatic function panel    Lipid panel    TSH    Hemoglobin A1c    Hemoglobin A1c    TSH    Lipid panel    Hepatic function panel    CBC with Differential/Platelet    Basic metabolic panel    4. Medication management  J19.417 Basic metabolic panel    CBC with Differential/Platelet    Hepatic function panel    Lipid panel    TSH    Hemoglobin A1c    Hemoglobin A1c    TSH    Lipid panel    Hepatic function panel    CBC with Differential/Platelet    Basic metabolic panel    Disc HCM Lipids  lsi   Uses supplements  but  no obv untoward  effects  Return in about 1  year (around 08/29/2022) for depending on results.  Patient Care Team: Ethon Wymer, Standley Brooking, MD as PCP - Loni Beckwith, MD (Orthopedic Surgery) Milly Jakob, MD as Consulting Physician (Orthopedic Surgery) Hennie Duos, MD as Consulting Physician (Rheumatology) Patient Instructions  Good to see you today . Will notify you  of labs when available. Will be on my chart .  Continue lifestyle intervention healthy eating and exercise .   The 10-year ASCVD risk score (Arnett DK, et al., 2019) is: 1.4%   Values used to calculate the score:     Age: 46 years     Sex: Female     Is Non-Hispanic African American: No     Diabetic: No     Tobacco smoker: No     Systolic Blood Pressure: 742 mmHg     Is  BP treated: No     HDL Cholesterol: 81 mg/dL     Total Cholesterol: 224 mg/dL   Standley Brooking. Rodrecus Belsky M.D.

## 2021-08-29 ENCOUNTER — Encounter: Payer: Self-pay | Admitting: Internal Medicine

## 2021-08-29 ENCOUNTER — Ambulatory Visit (INDEPENDENT_AMBULATORY_CARE_PROVIDER_SITE_OTHER): Payer: No Typology Code available for payment source | Admitting: Internal Medicine

## 2021-08-29 VITALS — BP 120/80 | HR 71 | Temp 98.8°F | Ht 61.5 in | Wt 102.8 lb

## 2021-08-29 DIAGNOSIS — Z79899 Other long term (current) drug therapy: Secondary | ICD-10-CM

## 2021-08-29 DIAGNOSIS — M549 Dorsalgia, unspecified: Secondary | ICD-10-CM

## 2021-08-29 DIAGNOSIS — Z0000 Encounter for general adult medical examination without abnormal findings: Secondary | ICD-10-CM

## 2021-08-29 DIAGNOSIS — G8929 Other chronic pain: Secondary | ICD-10-CM

## 2021-08-29 DIAGNOSIS — E785 Hyperlipidemia, unspecified: Secondary | ICD-10-CM

## 2021-08-29 LAB — BASIC METABOLIC PANEL
BUN: 21 mg/dL (ref 6–23)
CO2: 30 mEq/L (ref 19–32)
Calcium: 9.8 mg/dL (ref 8.4–10.5)
Chloride: 102 mEq/L (ref 96–112)
Creatinine, Ser: 0.78 mg/dL (ref 0.40–1.20)
GFR: 85.36 mL/min (ref >60.00)
Glucose, Bld: 89 mg/dL (ref 70–99)
Potassium: 4 mEq/L (ref 3.5–5.1)
Sodium: 141 mEq/L (ref 135–145)

## 2021-08-29 LAB — CBC WITH DIFFERENTIAL/PLATELET
Basophils Absolute: 0 10*3/uL (ref 0.0–0.1)
Basophils Relative: 0.6 % (ref 0.0–3.0)
Eosinophils Absolute: 0 10*3/uL (ref 0.0–0.7)
Eosinophils Relative: 0.7 % (ref 0.0–5.0)
HCT: 39.4 % (ref 36.0–46.0)
Hemoglobin: 12.9 g/dL (ref 12.0–15.0)
Lymphocytes Relative: 33.6 % (ref 12.0–46.0)
Lymphs Abs: 1.6 10*3/uL (ref 0.7–4.0)
MCHC: 32.8 g/dL (ref 30.0–36.0)
MCV: 88.2 fl (ref 78.0–100.0)
Monocytes Absolute: 0.3 10*3/uL (ref 0.1–1.0)
Monocytes Relative: 7.5 % (ref 3.0–12.0)
Neutro Abs: 2.7 10*3/uL (ref 1.4–7.7)
Neutrophils Relative %: 57.6 % (ref 43.0–77.0)
Platelets: 260 10*3/uL (ref 150.0–400.0)
RBC: 4.47 Mil/uL (ref 3.87–5.11)
RDW: 12.8 % (ref 11.5–15.5)
WBC: 4.6 10*3/uL (ref 4.0–10.5)

## 2021-08-29 LAB — HEPATIC FUNCTION PANEL
ALT: 13 U/L (ref 0–35)
AST: 20 U/L (ref 0–37)
Albumin: 4.6 g/dL (ref 3.5–5.2)
Alkaline Phosphatase: 60 U/L (ref 39–117)
Bilirubin, Direct: 0.1 mg/dL (ref 0.0–0.3)
Total Bilirubin: 0.7 mg/dL (ref 0.2–1.2)
Total Protein: 7 g/dL (ref 6.0–8.3)

## 2021-08-29 LAB — LIPID PANEL
Cholesterol: 197 mg/dL (ref 0–200)
HDL: 79.5 mg/dL (ref >39.00)
LDL Cholesterol: 109 mg/dL — ABNORMAL HIGH (ref 0–99)
NonHDL: 117.71
Total CHOL/HDL Ratio: 2
Triglycerides: 46 mg/dL (ref 0.0–149.0)
VLDL: 9.2 mg/dL (ref 0.0–40.0)

## 2021-08-29 LAB — HEMOGLOBIN A1C: Hgb A1c MFr Bld: 5.8 % (ref 4.6–6.5)

## 2021-08-29 LAB — TSH: TSH: 1.28 u[IU]/mL (ref 0.35–5.50)

## 2021-08-29 NOTE — Patient Instructions (Addendum)
Good to see you today . Will notify you  of labs when available. Will be on my chart .  Continue lifestyle intervention healthy eating and exercise .   The 10-year ASCVD risk score (Arnett DK, et al., 2019) is: 1.4%   Values used to calculate the score:     Age: 56 years     Sex: Female     Is Non-Hispanic African American: No     Diabetic: No     Tobacco smoker: No     Systolic Blood Pressure: 282 mmHg     Is BP treated: No     HDL Cholesterol: 81 mg/dL     Total Cholesterol: 224 mg/dL

## 2021-09-04 NOTE — Progress Notes (Signed)
Cholesterol is lower than last year ( favorable) a1c 5.8 borderline normal but no diabetes . Blood count liver kidney thyroid all normal . Continue lifestyle intervention healthy eating and exercise . And see you in a year.

## 2022-02-08 ENCOUNTER — Encounter: Payer: Self-pay | Admitting: Family Medicine

## 2022-02-08 ENCOUNTER — Ambulatory Visit: Payer: Self-pay

## 2022-02-08 ENCOUNTER — Ambulatory Visit (INDEPENDENT_AMBULATORY_CARE_PROVIDER_SITE_OTHER): Payer: No Typology Code available for payment source | Admitting: Family Medicine

## 2022-02-08 ENCOUNTER — Telehealth: Payer: Self-pay | Admitting: Family Medicine

## 2022-02-08 VITALS — BP 112/70 | Ht 61.5 in | Wt 102.0 lb

## 2022-02-08 DIAGNOSIS — M533 Sacrococcygeal disorders, not elsewhere classified: Secondary | ICD-10-CM | POA: Diagnosis not present

## 2022-02-08 DIAGNOSIS — M7551 Bursitis of right shoulder: Secondary | ICD-10-CM

## 2022-02-08 DIAGNOSIS — M25511 Pain in right shoulder: Secondary | ICD-10-CM

## 2022-02-08 MED ORDER — NITROGLYCERIN 0.2 MG/HR TD PT24: MEDICATED_PATCH | TRANSDERMAL | 11 refills | Status: DC

## 2022-02-08 NOTE — Assessment & Plan Note (Signed)
Acutely occurring.  Does have a history of surgery from several years ago.   -Counseled on home exercise therapy and supportive care. -Nitro patches. -Pursue shockwave therapy. -Could consider injection or physical therapy.

## 2022-02-08 NOTE — Assessment & Plan Note (Signed)
Acute on chronic in nature.  Having an exacerbation.  She did well with the previous injection. -Counseled on home exercise therapy and supportive care. -Counseled on compression. -Could consider injection.

## 2022-02-08 NOTE — Progress Notes (Signed)
  Stephanie Hoffman - 56 y.o. female MRN [id number]  Date of birth: 07-Dec-1965  SUBJECTIVE:  Including CC & ROS.  No chief complaint on file.   Stephanie Hoffman is a 56 y.o. female that is presenting with acute right shoulder pain and acute on chronic left lateral hip and leg pain.  The shoulder pain has been ongoing for the past few weeks.  She does have a history of surgery in the shoulder from the early 2000's.  No specific injury inciting event.   Review of Systems See HPI   HISTORY: Past Medical, Surgical, Social, and Family History Reviewed & Updated per EMR.   Pertinent Historical Findings include:  Past Medical History:  Diagnosis Date   Angio-edema    Gallstones    removed 2000   Headache(784.0)    Heart murmur    Hyperlipidemia    Pneumonia    Recurrent upper respiratory infection (URI)    Urticaria    Varicose veins     Past Surgical History:  Procedure Laterality Date   CESAREAN SECTION     CHOLECYSTECTOMY N/A 2007   COLONOSCOPY  01/27/2015   Dr.Gessner   KNEE SURGERY     left torn ligament 2007   SHOULDER SURGERY     right and left   WISDOM TOOTH EXTRACTION Bilateral 1997     PHYSICAL EXAM:  VS: BP 112/70 (BP Location: Left Arm, Patient Position: Sitting)   Ht 5' 1.5" (1.562 m)   Wt 102 lb (46.3 kg)   LMP 02/12/2020   BMI 18.96 kg/m  Physical Exam Gen: NAD, alert, cooperative with exam, well-appearing MSK:  Neurovascularly intact    Limited ultrasound: Right shoulder:  Normal-appearing biceps tendon. Mild calcification at the footprint of the subscapularis. Mild subacromial bursitis and chronic changes of the supraspinatus.   Summary: Subacromial bursitis  Ultrasound and interpretation by Clearance Coots, MD    ASSESSMENT & PLAN:   Sacroiliac joint dysfunction of left side Acute on chronic in nature.  Having an exacerbation.  She did well with the previous injection. -Counseled on home exercise therapy and supportive  care. -Counseled on compression. -Could consider injection.  Subacromial bursitis of right shoulder joint Acutely occurring.  Does have a history of surgery from several years ago.   -Counseled on home exercise therapy and supportive care. -Nitro patches. -Pursue shockwave therapy. -Could consider injection or physical therapy.

## 2022-02-08 NOTE — Telephone Encounter (Signed)
error 

## 2022-02-08 NOTE — Patient Instructions (Signed)
Good to see you Please try heat on the back and try over the counter voltaren  Please consider the compression  Please try the nitro patches   Please send me a message in MyChart with any questions or updates.  Please see me back to start shockwave therapy for the shoulder.   --Dr. Raeford Razor  Nitroglycerin Protocol  Apply 1/4 nitroglycerin patch to affected area daily. Change position of patch within the affected area every 24 hours. You may experience a headache during the first 1-2 weeks of using the patch, these should subside. If you experience headaches after beginning nitroglycerin patch treatment, you may take your preferred over the counter pain reliever. Another side effect of the nitroglycerin patch is skin irritation or rash related to patch adhesive. Please notify our office if you develop more severe headaches or rash, and stop the patch. Tendon healing with nitroglycerin patch may require 12 to 24 weeks depending on the extent of injury. Men should not use if taking Viagra, Cialis, or Levitra.  Do not use if you have migraines or rosacea.

## 2022-02-15 ENCOUNTER — Encounter: Payer: Self-pay | Admitting: Family Medicine

## 2022-02-15 ENCOUNTER — Ambulatory Visit (INDEPENDENT_AMBULATORY_CARE_PROVIDER_SITE_OTHER): Payer: Self-pay | Admitting: Family Medicine

## 2022-02-15 DIAGNOSIS — M7551 Bursitis of right shoulder: Secondary | ICD-10-CM

## 2022-02-15 NOTE — Progress Notes (Signed)
  Stephanie Hoffman - 56 y.o. female MRN [id number]  Date of birth: Aug 30, 1965  SUBJECTIVE:  Including CC & ROS.  No chief complaint on file.   Stephanie Hoffman is a 56 y.o. female that is here for shockwave therapy.   Review of Systems See HPI   HISTORY: Past Medical, Surgical, Social, and Family History Reviewed & Updated per EMR.   Pertinent Historical Findings include:  Past Medical History:  Diagnosis Date   Angio-edema    Gallstones    removed 2000   Headache(784.0)    Heart murmur    Hyperlipidemia    Pneumonia    Recurrent upper respiratory infection (URI)    Urticaria    Varicose veins     Past Surgical History:  Procedure Laterality Date   CESAREAN SECTION     CHOLECYSTECTOMY N/A 2007   COLONOSCOPY  01/27/2015   Dr.Gessner   KNEE SURGERY     left torn ligament 2007   SHOULDER SURGERY     right and left   WISDOM TOOTH EXTRACTION Bilateral 1997     PHYSICAL EXAM:  VS: LMP 02/12/2020  Physical Exam Gen: NAD, alert, cooperative with exam, well-appearing MSK:  Neurovascularly intact    ECSWT Note Stephanie Hoffman [birth year]  Procedure: ECSWT Indications: right shoulder pain   Procedure Details Consent: Risks of procedure as well as the alternatives and risks of each were explained to the (patient/caregiver).  Consent for procedure obtained. Time Out: Verified patient identification, verified procedure, site/side was marked, verified correct patient position, special equipment/implants available, medications/allergies/relevent history reviewed, required imaging and test results available.  Performed.  The area was cleaned with iodine and alcohol swabs.    The right shoulder was targeted for Extracorporeal shockwave therapy.   Preset: shoulder tendinitis  Power Level: 60 Frequency: 10 Impulse/cycles: 2000 Head size: medium  Session: 1  Patient did tolerate procedure well.    ASSESSMENT & PLAN:   Subacromial bursitis of right shoulder  joint Completed shockwave therapy

## 2022-02-15 NOTE — Assessment & Plan Note (Signed)
Completed shockwave therapy  

## 2022-03-01 ENCOUNTER — Ambulatory Visit: Payer: No Typology Code available for payment source | Admitting: Family Medicine

## 2022-03-08 ENCOUNTER — Ambulatory Visit (INDEPENDENT_AMBULATORY_CARE_PROVIDER_SITE_OTHER): Payer: No Typology Code available for payment source | Admitting: Family Medicine

## 2022-03-08 VITALS — BP 130/70 | Ht 61.5 in | Wt 102.0 lb

## 2022-03-08 DIAGNOSIS — M5416 Radiculopathy, lumbar region: Secondary | ICD-10-CM

## 2022-03-08 MED ORDER — NAPROXEN 500 MG PO TABS: 500.0000 mg | ORAL_TABLET | Freq: Two times a day (BID) | ORAL | 3 refills | Status: DC

## 2022-03-08 NOTE — Progress Notes (Signed)
  Stephanie Hoffman - 56 y.o. female MRN [id number]  Date of birth: 20-May-1966  SUBJECTIVE:  Including CC & ROS.  No chief complaint on file.   Stephanie Hoffman is a 56 y.o. female that is presenting with bilateral lower extremity pain.  She has a history of radicular type pain.   Review of Systems See HPI   HISTORY: Past Medical, Surgical, Social, and Family History Reviewed & Updated per EMR.   Pertinent Historical Findings include:  Past Medical History:  Diagnosis Date   Angio-edema    Gallstones    removed 2000   Headache(784.0)    Heart murmur    Hyperlipidemia    Pneumonia    Recurrent upper respiratory infection (URI)    Urticaria    Varicose veins     Past Surgical History:  Procedure Laterality Date   CESAREAN SECTION     CHOLECYSTECTOMY N/A 2007   COLONOSCOPY  01/27/2015   Dr.Gessner   KNEE SURGERY     left torn ligament 2007   SHOULDER SURGERY     right and left   WISDOM TOOTH EXTRACTION Bilateral 1997     PHYSICAL EXAM:  VS: BP 130/70   Ht 5' 1.5" (1.562 m)   Wt 102 lb (46.3 kg)   LMP 02/12/2020   BMI 18.96 kg/m  Physical Exam Gen: NAD, alert, cooperative with exam, well-appearing MSK:  Neurovascularly intact       ASSESSMENT & PLAN:   Lumbar radiculopathy Acutely occurring.  Having pain that is more consistent with a nerve impingement.  Has degenerative changes observed on previous MRI. -Counseled on home exercise therapy and supportive care. -Pursue epidural

## 2022-03-08 NOTE — Patient Instructions (Signed)
Good to see you Please use the naproxen as needed   I have sent the order for the epidural to [facility]  Please send me a message in MyChart with any questions or updates.  Please give me a call one week after the epidural .   --Dr. Raeford Razor

## 2022-03-08 NOTE — Assessment & Plan Note (Signed)
Acutely occurring.  Having pain that is more consistent with a nerve impingement.  Has degenerative changes observed on previous MRI. -Counseled on home exercise therapy and supportive care. -Pursue epidural

## 2022-03-17 ENCOUNTER — Inpatient Hospital Stay: Admission: RE | Admit: 2022-03-17 | Payer: No Typology Code available for payment source | Source: Ambulatory Visit

## 2022-06-15 ENCOUNTER — Encounter: Payer: Self-pay | Admitting: Family Medicine

## 2022-08-10 LAB — HM MAMMOGRAPHY

## 2022-08-11 ENCOUNTER — Encounter: Payer: Self-pay | Admitting: Internal Medicine

## 2022-08-31 ENCOUNTER — Encounter: Payer: No Typology Code available for payment source | Admitting: Internal Medicine

## 2022-09-26 ENCOUNTER — Telehealth: Payer: Self-pay | Admitting: Internal Medicine

## 2022-09-26 NOTE — Telephone Encounter (Signed)
Because MD will be OOO on 09/28/22, Pt's CPE was rescheduled for 10/18/22.  I noticed that Pt also has labs scheduled for 09/29/22.  Does MD still want Pt to keep that appointment, or should she do her labs on the 20th of March, when she comes in for her CPE?  Please advise.

## 2022-09-27 NOTE — Telephone Encounter (Signed)
No future lab orders were placed.  Nothing noted patient that  should return prior to CPE for labs.   Please reverify if patient can wait for labs after seeing provider on 10/18/22.

## 2022-09-28 ENCOUNTER — Encounter: Payer: No Typology Code available for payment source | Admitting: Internal Medicine

## 2022-09-29 ENCOUNTER — Other Ambulatory Visit: Payer: No Typology Code available for payment source

## 2022-09-29 NOTE — Telephone Encounter (Signed)
Lab appointment is cancelled for 09/29/22

## 2022-10-18 ENCOUNTER — Ambulatory Visit (INDEPENDENT_AMBULATORY_CARE_PROVIDER_SITE_OTHER): Payer: No Typology Code available for payment source | Admitting: Internal Medicine

## 2022-10-18 ENCOUNTER — Encounter: Payer: Self-pay | Admitting: Internal Medicine

## 2022-10-18 ENCOUNTER — Other Ambulatory Visit (HOSPITAL_COMMUNITY)
Admission: RE | Admit: 2022-10-18 | Discharge: 2022-10-18 | Disposition: A | Discharge status: Home / Self Care | Payer: No Typology Code available for payment source | Source: Ambulatory Visit | Attending: Internal Medicine | Admitting: Internal Medicine

## 2022-10-18 VITALS — BP 114/78 | HR 70 | Temp 98.3°F | Ht 61.0 in | Wt 106.1 lb

## 2022-10-18 DIAGNOSIS — N941 Unspecified dyspareunia: Secondary | ICD-10-CM

## 2022-10-18 DIAGNOSIS — Z79899 Other long term (current) drug therapy: Secondary | ICD-10-CM | POA: Diagnosis not present

## 2022-10-18 DIAGNOSIS — E785 Hyperlipidemia, unspecified: Secondary | ICD-10-CM | POA: Diagnosis not present

## 2022-10-18 DIAGNOSIS — Z0000 Encounter for general adult medical examination without abnormal findings: Secondary | ICD-10-CM

## 2022-10-18 DIAGNOSIS — N951 Menopausal and female climacteric states: Secondary | ICD-10-CM | POA: Diagnosis not present

## 2022-10-18 DIAGNOSIS — Z01419 Encounter for gynecological examination (general) (routine) without abnormal findings: Secondary | ICD-10-CM

## 2022-10-18 DIAGNOSIS — Z124 Encounter for screening for malignant neoplasm of cervix: Secondary | ICD-10-CM | POA: Diagnosis present

## 2022-10-18 NOTE — Progress Notes (Signed)
Chief Complaint  Patient presents with   Annual Exam    HPI: Patient  Stephanie Hoffman  57 y.o. comes in today for Preventive Health Care visit  And some concerns  Would like screening labs today  trying to eat right  exercise   Menopause :vLast cycle  3 years ago  night flushes less frequent took something for this  Focus is off and some anxiety  taking otc supp  melatonin    Feels closed  in vaginal area ?  painful. Intercourse.  K y supp.  And supplemtn  no more stinging but feels tight pain ful and "closed "   Health Maintenance  Topic Date Due   PAP SMEAR-Modifier  08/08/2020   INFLUENZA VACCINE  02/28/2022   COVID-19 Vaccine (4 - 2023-24 season) 03/31/2022   MAMMOGRAM  08/11/2023   DTaP/Tdap/Td (4 - Td or Tdap) 09/25/2023   COLONOSCOPY (Pts 45-19yrs Insurance coverage will need to be confirmed)  04/30/2025   Hepatitis C Screening  Completed   HIV Screening  Completed   Zoster Vaccines- Shingrix  Completed   HPV VACCINES  Aged Out   Health Maintenance Review LIFESTYLE:  Exercise:  q  d  Tobacco/ETS: no Alcohol:  no Sugar beverages: not reg  green tea  honey  Sleep: melatonin   Drug use: no HH of   2  no pets  Work: 8 hours x 5 days sometimes more     ROS:  GEN/ HEENT: No fever, significant weight changes sweats headaches vision problems hearing changes, CV/ PULM; No chest pain shortness of breath cough, syncope,edema  change in exercise tolerance. GI /GU: No adominal pain, vomiting, change in bowel habits. No blood in the stool. N SKIN/HEME: ,no acute skin rashes suspicious lesions or bleeding. No lymphadenopathy, nodules, masses.  NEURO/ PSYCH:  No neurologic signs such as weakness numbness. No depression anxiety. IMM/ Allergy: No unusual infections.  Allergy .   REST of 12 system review negative except as per HPI   Past Medical History:  Diagnosis Date   Angio-edema    Gallstones    removed 2000   Headache(784.0)    Heart murmur    Hyperlipidemia     Pneumonia    Recurrent upper respiratory infection (URI)    Urticaria    Varicose veins     Past Surgical History:  Procedure Laterality Date   CESAREAN SECTION     CHOLECYSTECTOMY N/A 2007   COLONOSCOPY  01/27/2015   Dr.Gessner   KNEE SURGERY     left torn ligament 2007   SHOULDER SURGERY     right and left   WISDOM TOOTH EXTRACTION Bilateral 1997    Family History  Problem Relation Age of Onset   Arthritis Mother    Diabetes Mother    Hyperlipidemia Mother    Heart disease Mother    Colon cancer Mother 81   Hypertension Father    Prostate cancer Maternal Grandfather 2   Stroke Paternal Grandfather    Stomach cancer Maternal Grandmother 52   Esophageal cancer Neg Hx    Rectal cancer Neg Hx    Colon polyps Neg Hx     Social History   Socioeconomic History   Marital status: Married    Spouse name: Not on file   Number of children: Not on file   Years of education: Not on file   Highest education level: Not on file  Occupational History   Not on file  Tobacco Use  Smoking status: Never   Smokeless tobacco: Never  Vaping Use   Vaping Use: Never used  Substance and Sexual Activity   Alcohol use: No   Drug use: No   Sexual activity: Yes    Comment: husband had vastectomy  Other Topics Concern   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     Cyanocobalamin (VITAMIN B-12 PO) Take by mouth.     diphenhydramine-acetaminophen (TYLENOL PM) 25-500 MG TABS Take 1 tablet by mouth  at bedtime as needed (sleep).      Menaquinone-7 (VITAMIN K2 PO) Take by mouth.     Multiple Vitamins-Minerals (WOMENS MULTIVITAMIN PO) Take by mouth.     Potassium 99 MG TABS Take 99 mg by mouth daily.     Turmeric (QC TUMERIC COMPLEX PO) Take by mouth.     GARLIC PO Take by mouth.     naproxen (NAPROSYN) 500 MG tablet Take 1 tablet (500 mg total) by mouth 2 (two) times daily with a meal. 60 tablet 3   Nutritional Supplements (ESTROVEN PO) Take by mouth.     No facility-administered medications prior to visit.     EXAM:  BP 114/78 (BP Location: Right Arm, Patient Position: Sitting, Cuff Size: Normal)   Pulse 70   Temp 98.3 F (36.8 C) (Oral)   Ht 5\' 1"  (1.549 m)   Wt 106 lb 1.6 oz (48.1 kg)   LMP 02/12/2020   SpO2 98%   BMI 20.05 kg/m   Body mass index is 20.05 kg/m. Wt Readings from Last 3 Encounters:  10/18/22 106 lb 1.6 oz (48.1 kg)  03/08/22 102 lb (46.3 kg)  02/08/22 102 lb (46.3 kg)    Physical Exam: Vital signs reviewed [id number] is a well-developed well-nourished alert cooperative    who appearsr stated age in no acute distress.  HEENT: normocephalic atraumatic , Eyes: PERRL EOM's full, conjunctiva clear, Nares: paten,t no deformity discharge or tenderness., Ears: no deformity EAC's clear TMs with normal landmarks. Mouth masked  NECK: supple without masses, thyromegaly or bruits. CHEST/PULM:  Clear to auscultation and percussion breath sounds equal no wheeze , rales or rhonchi. No chest wall deformities or tenderness. Breast: normal by inspection . No dimpling, discharge, masses, tenderness or discharge . CV: PMI is nondisplaced, S1 S2 no gallops, murmurs, rubs. Peripheral pulses are full without delay.No JVD .  ABDOMEN: Bowel sounds normal nontender  No guard or rebound, no hepato splenomegal no CVA tenderness.   Extremtities:  No clubbing cyanosis or edema, no acute joint swelling or redness no focal atrophy few VV  NEURO:  Oriented x3, cranial nerves 3-12 appear  to be intact, no obvious focal weakness,gait within normal limits no abnormal reflexes or asymmetrical SKIN: No acute rashes normal turgor, color, no bruising or petechiae. PSYCH: Oriented, good eye contact, no obvious depression anxiety, cognition and judgment appear normal. LN: no cervical axillary inguinal adenopathy Pelvic: NL ext GU,  labia clear without lesions or rash . Nl introitus pink mucosa whit dc no redness or lesion  small speculum used Vagina no lesions .Cervix: clear  UTERUS: Neg CMT Adnexa:  clear no masses . PAP donewith hpv cotestting      Lab Results  Component Value Date   WBC 4.6 08/29/2021   HGB 12.9 08/29/2021   HCT 39.4 08/29/2021   PLT 260.0 08/29/2021   GLUCOSE 89 08/29/2021   CHOL 197 08/29/2021   TRIG 46.0 08/29/2021   HDL 79.50 08/29/2021   LDLDIRECT 136.9 08/27/2013   LDLCALC 109 (H) 08/29/2021   ALT 13 08/29/2021   AST 20 08/29/2021   NA 141 08/29/2021   K 4.0 08/29/2021   CL 102 08/29/2021   CREATININE 0.78 08/29/2021   BUN 21 08/29/2021   CO2 30 08/29/2021   TSH 1.28 08/29/2021   HGBA1C 5.8 08/29/2021   MICROALBUR 1.0 07/17/2016    BP Readings from Last 3 Encounters:  10/18/22 114/78  03/08/22 130/70  02/08/22 112/70    Lab plan reviewed with patient   ASSESSMENT AND PLAN:  Discussed the following assessment and plan:    ICD-10-CM   1. Visit for preventive health examination  Z00.00 CBC with Differential/Platelet    Comprehensive metabolic panel    Lipid panel    TSH    Hemoglobin A1c    2. Encounter for gynecological examination without abnormal finding  Z01.419     3. Hyperlipidemia, unspecified hyperlipidemia type  E78.5 CBC with Differential/Platelet    Comprehensive metabolic panel    Lipid panel    TSH    Hemoglobin A1c    4. Medication management  Z79.899 CBC with Differential/Platelet    Comprehensive metabolic panel    Lipid panel    TSH    Hemoglobin A1c    5. Menopausal symptom  N95.1     6. Screening for  cervical cancer  Z12.4 PAP [[facility]]    7. Dyspareunia, female  N94.10    cont lubricant hand out may benefit from exercises other intervntion ? vag atrophy plus vaginismus? exam unrevealing today mucosa appears pink mild discomfort    Lab today  counseling and consider see gyn if  on going problems  Poss pelvic exercises with lubricant  Other   consider vag estrogen .  Return in about 1 year (around 10/18/2023) for depending on results and how doing.  Patient Care Team: Nalaysia Manganiello, Standley Brooking, MD as PCP - Loni Beckwith, MD (Orthopedic Surgery) Milly Jakob, MD as Consulting Physician (Orthopedic Surgery) Hennie Duos, MD as Consulting Physician (Rheumatology) Patient Instructions  Good to see you today . Exam is  normal . Continue lubricants   consider  adding topical estrogen cream but thinking that  other relaxation of pelvic muscles may be best  Consdier seeing gyne for more advice   Standley Brooking. Andie Mortimer M.D.

## 2022-10-18 NOTE — Patient Instructions (Addendum)
Good to see you today . Exam is  normal . Continue lubricants   consider  adding topical estrogen cream but thinking that  other relaxation of pelvic muscles may be best  Consdier seeing gyne for more advice

## 2022-10-19 LAB — CBC WITH DIFFERENTIAL/PLATELET
Basophils Absolute: 0 10*3/uL (ref 0.0–0.1)
Basophils Relative: 0.6 % (ref 0.0–3.0)
Eosinophils Absolute: 0.1 10*3/uL (ref 0.0–0.7)
Eosinophils Relative: 0.9 % (ref 0.0–5.0)
HCT: 41 % (ref 36.0–46.0)
Hemoglobin: 13.7 g/dL (ref 12.0–15.0)
Lymphocytes Relative: 25.5 % (ref 12.0–46.0)
Lymphs Abs: 1.5 10*3/uL (ref 0.7–4.0)
MCHC: 33.3 g/dL (ref 30.0–36.0)
MCV: 87.8 fl (ref 78.0–100.0)
Monocytes Absolute: 0.4 10*3/uL (ref 0.1–1.0)
Monocytes Relative: 7.2 % (ref 3.0–12.0)
Neutro Abs: 3.8 10*3/uL (ref 1.4–7.7)
Neutrophils Relative %: 65.8 % (ref 43.0–77.0)
Platelets: 280 10*3/uL (ref 150.0–400.0)
RBC: 4.67 Mil/uL (ref 3.87–5.11)
RDW: 12.4 % (ref 11.5–15.5)
WBC: 5.8 10*3/uL (ref 4.0–10.5)

## 2022-10-19 LAB — COMPREHENSIVE METABOLIC PANEL
ALT: 10 U/L (ref 0–35)
AST: 18 U/L (ref 0–37)
Albumin: 5 g/dL (ref 3.5–5.2)
Alkaline Phosphatase: 64 U/L (ref 39–117)
BUN: 22 mg/dL (ref 6–23)
CO2: 26 mEq/L (ref 19–32)
Calcium: 10 mg/dL (ref 8.4–10.5)
Chloride: 104 mEq/L (ref 96–112)
Creatinine, Ser: 0.82 mg/dL (ref 0.40–1.20)
GFR: 79.75 mL/min (ref >60.00)
Glucose, Bld: 81 mg/dL (ref 70–99)
Potassium: 3.9 mEq/L (ref 3.5–5.1)
Sodium: 141 mEq/L (ref 135–145)
Total Bilirubin: 0.7 mg/dL (ref 0.2–1.2)
Total Protein: 7.7 g/dL (ref 6.0–8.3)

## 2022-10-19 LAB — LIPID PANEL
Cholesterol: 243 mg/dL — ABNORMAL HIGH (ref 0–200)
HDL: 93.9 mg/dL (ref >39.00)
LDL Cholesterol: 137 mg/dL — ABNORMAL HIGH (ref 0–99)
NonHDL: 149.53
Total CHOL/HDL Ratio: 3
Triglycerides: 61 mg/dL (ref 0.0–149.0)
VLDL: 12.2 mg/dL (ref 0.0–40.0)

## 2022-10-19 LAB — HEMOGLOBIN A1C: Hgb A1c MFr Bld: 5.6 % (ref 4.6–6.5)

## 2022-10-20 LAB — TSH: TSH: 0.89 u[IU]/mL (ref 0.35–5.50)

## 2022-10-23 LAB — CYTOLOGY - PAP
Comment: NEGATIVE
Diagnosis: NEGATIVE
High risk HPV: NEGATIVE

## 2022-10-30 NOTE — Progress Notes (Signed)
Pap is normal with neg hpv risk . Cholesterol is higher than last year  but HDL still high and favorable. Rest of labs  are normal  range  The 10-year ASCVD risk score (Arnett DK, et al., 2019) is: 1.3%   Values used to calculate the score:     Age: 57 years     Sex: Female     Is Non-Hispanic African American: No     Diabetic: No     Tobacco smoker: No     Systolic Blood Pressure: 99991111 mmHg     Is BP treated: No     HDL Cholesterol: 93.9 mg/dL     Total Cholesterol: 243 mg/dL

## 2022-11-13 ENCOUNTER — Encounter: Payer: Self-pay | Admitting: *Deleted

## 2023-01-10 DIAGNOSIS — M79641 Pain in right hand: Secondary | ICD-10-CM | POA: Insufficient documentation

## 2023-03-26 ENCOUNTER — Other Ambulatory Visit (HOSPITAL_BASED_OUTPATIENT_CLINIC_OR_DEPARTMENT_OTHER): Payer: Self-pay

## 2023-03-26 ENCOUNTER — Ambulatory Visit (HOSPITAL_BASED_OUTPATIENT_CLINIC_OR_DEPARTMENT_OTHER): Payer: No Typology Code available for payment source | Admitting: Student

## 2023-03-26 ENCOUNTER — Ambulatory Visit (HOSPITAL_BASED_OUTPATIENT_CLINIC_OR_DEPARTMENT_OTHER): Payer: No Typology Code available for payment source

## 2023-03-26 ENCOUNTER — Encounter (HOSPITAL_BASED_OUTPATIENT_CLINIC_OR_DEPARTMENT_OTHER): Payer: Self-pay | Admitting: Student

## 2023-03-26 DIAGNOSIS — M5442 Lumbago with sciatica, left side: Secondary | ICD-10-CM

## 2023-03-26 DIAGNOSIS — M5441 Lumbago with sciatica, right side: Secondary | ICD-10-CM

## 2023-03-26 DIAGNOSIS — G8929 Other chronic pain: Secondary | ICD-10-CM | POA: Diagnosis not present

## 2023-03-26 MED ORDER — METHYLPREDNISOLONE 4 MG PO TBPK
ORAL_TABLET | ORAL | 0 refills | Status: DC
  Filled 2023-03-26: qty 21, 6d supply, fill #0

## 2023-03-26 MED ORDER — METHOCARBAMOL 500 MG PO TABS
500.0000 mg | ORAL_TABLET | Freq: Three times a day (TID) | ORAL | 0 refills | Status: AC | PRN
  Filled 2023-03-26: qty 30, 10d supply, fill #0

## 2023-03-27 NOTE — Progress Notes (Signed)
Chief Complaint: Low back pain     History of Present Illness:    Stephanie Hoffman is a 57 y.o. female presents today for evaluation of low back pain.  She does have a history of low back issues that dates back to 2017.  Patient states that current episode of pain began about 4 days ago with no known injury or cause.  Pain is located mostly in the right side of the low back, and radiates down the right leg into the foot.  Pain levels are moderate, and this has been waking her up at night.  Denies any numbness, tingling, or groin pain.  She has tried taking a warm shower as well as Tylenol arthritis which did help some.  Denies any previous physical therapy.  Does have history of hip bursitis diagnosed in 2018.  She did have a lumbar MRI 02/03/2021 which revealed degenerative changes and a disc bulge at L5-S1 stenosis per report.   Surgical History:   None  PMH/PSH/Family History/Social History/Meds/Allergies:    Past Medical History:  Diagnosis Date   Angio-edema    Gallstones    removed 2000   Headache(784.0)    Heart murmur    Hyperlipidemia    Pneumonia    Recurrent upper respiratory infection (URI)    Urticaria    Varicose veins    Past Surgical History:  Procedure Laterality Date   CESAREAN SECTION     CHOLECYSTECTOMY N/A 2007   COLONOSCOPY  01/27/2015   Dr.Gessner   KNEE SURGERY     left torn ligament 2007   SHOULDER SURGERY     right and left   WISDOM TOOTH EXTRACTION Bilateral 1997   Social History   Socioeconomic History   Marital status: Married    Spouse name: Not on file   Number of children: Not on file   Years of education: Not on file   Highest education level: Not on file  Occupational History   Not on file  Tobacco Use   Smoking status: Never   Smokeless tobacco: Never  Vaping Use   Vaping status: Never Used  Substance and Sexual Activity   Alcohol use: No   Drug use: No   Sexual activity: Yes     Comment: husband had vastectomy  Other Topics Concern   Not on file  Social History Narrative   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   Married   Regular exercise- yes   HH of 3 no pet s now    not ets   Social Determinants of Corporate investment banker Strain: Not on file  Food Insecurity: Not on file  Transportation Needs: Not on file  Physical Activity: Not on file  Stress: Not on file  Social Connections: Not on file   Family History  Problem Relation Age of Onset   Arthritis Mother    Diabetes Mother    Hyperlipidemia Mother    Heart disease Mother    Colon cancer Mother 63   Hypertension Father    Prostate cancer Maternal Grandfather 74   Stroke Paternal Grandfather    Stomach cancer Maternal Grandmother 65   Esophageal cancer Neg Hx    Rectal cancer Neg Hx    Colon polyps Neg Hx    Allergies  Allergen Reactions   Amoxicillin Diarrhea   Coconut (Cocos Nucifera) Itching   Codeine Itching   Magnesium-Containing Compounds Itching   Penicillins Rash    Has patient had a PCN reaction causing immediate rash, facial/tongue/throat swelling, SOB or lightheadedness with hypotension: No Has patient had a PCN reaction causing severe rash involving mucus membranes or skin necrosis: No Has patient had a PCN reaction that required hospitalization: No Has patient had a PCN reaction occurring within the last 10 years: No If all of the above answers are "NO", then may proceed with Cephalosporin use.    Vitamin E-Safflower Oil Itching   Current Outpatient Medications  Medication Sig Dispense Refill   methocarbamol (ROBAXIN) 500 MG tablet Take 1 tablet (500 mg total) by mouth every 8 (eight) hours as needed for up to 10 days for muscle spasms. 30 tablet 0   methylPREDNISolone (MEDROL DOSEPAK) 4 MG TBPK tablet Take per packet instructions 21 tablet 0   Cholecalciferol (VITAMIN D3 PO) Take by mouth.     Cyanocobalamin (VITAMIN B-12 PO) Take by mouth.      diphenhydramine-acetaminophen (TYLENOL PM) 25-500 MG TABS Take 1 tablet by mouth at bedtime as needed (sleep).      Menaquinone-7 (VITAMIN K2 PO) Take by mouth.     Multiple Vitamins-Minerals (WOMENS MULTIVITAMIN PO) Take by mouth.     Potassium 99 MG TABS Take 99 mg by mouth daily.     Turmeric (QC TUMERIC COMPLEX PO) Take by mouth.     No current facility-administered medications for this visit.   DG Lumbar Spine Complete  Result Date: 03/26/2023 CLINICAL DATA:  Right low back pain. EXAM: LUMBAR SPINE - COMPLETE 4+ VIEW COMPARISON:  None Available. FINDINGS: There is no evidence of lumbar spine fracture. Alignment is normal. Narrow intervertebral space with mild anterior spurring noted at L5-S1. IMPRESSION: Mild degenerative joint changes at L5-S1. Electronically Signed   By: Sherian Rein M.D.   On: 03/26/2023 16:28    Review of Systems:   A ROS was performed including pertinent positives and negatives as documented in the HPI.  Physical Exam :   Constitutional: NAD and appears stated age Neurological: Alert and oriented Psych: Appropriate affect and cooperative Last menstrual period 02/12/2020.   Comprehensive Musculoskeletal Exam:    Tenderness to palpation throughout the right lumbar area and paraspinal muscles with noted muscle tension.  Lumbar spinous processes nontender.  Normal lumbar ROM with flexion, extension, and rotation.  Full passive hip range of motion bilaterally negative straight leg raise and Faber.  Knee flexion/extension and ankle dorsiflexion/plantarflexion strength is 5/5.  Patellar reflex 2+.  Neurosensory exam intact.  Imaging:   Xray (lumbar spine): Mild disc space loss at L5-S1 with small anterior osteophyte.  Otherwise negative.    I personally reviewed and interpreted the radiographs.   Assessment:   57 y.o. female with acute flareup of back pain she has had for about 7 years.  Based on her findings today as well as MRI report from 2022, I do  believe she would be a great candidate for physical therapy.  There does seem to be some residual right-sided muscular tightness, so she might also do well with dry needling.  I will plan to send her in a Medrol Dosepak as well as Robaxin to help manage symptoms in the meantime.  I will plan to see her back in about 6 weeks for follow-up for reevaluation and assessment of progress with therapy.  Plan :    -Start Medrol  Dosepak -Start Robaxin 500 mg as needed -Referral to physical therapy for lumbar program -Return to clinic in 6 weeks for reassessment     I personally saw and evaluated the patient, and participated in the management and treatment plan.  Hazle Nordmann, PA-C Orthopedics

## 2023-04-06 ENCOUNTER — Ambulatory Visit: Payer: No Typology Code available for payment source | Admitting: Physical Therapy

## 2023-04-06 ENCOUNTER — Encounter: Payer: Self-pay | Admitting: Physical Therapy

## 2023-04-06 ENCOUNTER — Other Ambulatory Visit: Payer: Self-pay

## 2023-04-06 DIAGNOSIS — M5459 Other low back pain: Secondary | ICD-10-CM

## 2023-04-06 DIAGNOSIS — R293 Abnormal posture: Secondary | ICD-10-CM | POA: Diagnosis not present

## 2023-04-06 DIAGNOSIS — M6281 Muscle weakness (generalized): Secondary | ICD-10-CM | POA: Diagnosis not present

## 2023-04-06 DIAGNOSIS — R29898 Other symptoms and signs involving the musculoskeletal system: Secondary | ICD-10-CM

## 2023-04-06 NOTE — Therapy (Signed)
OUTPATIENT PHYSICAL THERAPY THORACOLUMBAR EVALUATION   Patient Name: Stephanie Hoffman MRN: [id number] DOB:June 18, 1966, 57 y.o., female Today's Date: 04/06/2023  END OF SESSION:  PT End of Session - 04/06/23 1523     Visit Number 1    Number of Visits 13    Date for PT Re-Evaluation 05/18/23    Authorization Type [employer]/GE[employer]    Authorization Time Period 04/06/23 to 05/18/23    Authorization - Number of Visits 60    PT Start Time 1434    PT Stop Time 1514    PT Time Calculation (min) 40 min    Activity Tolerance Patient tolerated treatment well    Behavior During Therapy [facility] for tasks assessed/performed             Past Medical History:  Diagnosis Date   Angio-edema    Gallstones    removed 2000   Headache(784.0)    Heart murmur    Hyperlipidemia    Pneumonia    Recurrent upper respiratory infection (URI)    Urticaria    Varicose veins    Past Surgical History:  Procedure Laterality Date   CESAREAN SECTION     CHOLECYSTECTOMY N/A 2007   COLONOSCOPY  01/27/2015   Dr.Gessner   KNEE SURGERY     left torn ligament 2007   SHOULDER SURGERY     right and left   WISDOM TOOTH EXTRACTION Bilateral 1997   Patient Active Problem List   Diagnosis Date Noted   Subacromial bursitis of right shoulder joint 02/08/2022   Sacroiliac joint dysfunction of left side 05/09/2021   Lipoma of forearm 04/07/2021   Lumbar radiculopathy 04/07/2021   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  11/17/2013   Neck pain on right side 11/17/2013   Family hx of colon cancer 07/12/2012   ARTHRITIS, LEFT SHOULDER  04/13/2010   BREAST MASS, LEFT 02/04/2010   HEADACHE 05/02/2007    PCP: Berniece Andreas MD   REFERRING PROVIDER: Amador Cunas, PA-C  REFERRING DIAG: Diagnosis M54.42,M54.41 (ICD-10-CM) - Acute bilateral low back pain with bilateral sciatica  Rationale for Evaluation and Treatment: Rehabilitation  THERAPY DIAG:  Other low back pain  Muscle weakness (generalized)  Abnormal posture  Other symptoms and signs involving the musculoskeletal system  ONSET DATE: 3 weeks ago (recent flare), chronic back problems in general   SUBJECTIVE:  SUBJECTIVE STATEMENT: I've been having back issues since 2017. I work out so [employer] not sure if was something at the gym that might made my back worse, I was mostly working on core strength then added walking and running. Started doing 2.5 miles a day with my husband through the summer, then one morning I got up and pulled something out of my drawer, I took something for it and ignored it but it didn't go away. Tried some stretches for it and it didn't go away, was able to walk over the weekend but that Monday I couldn't really walk well and the pain was going into my butt and legs from the back. Went to [facility] and had to use a cane at that point, they gave me some medications and woke up feeling better but have still been feeling things/tightness in the back and decided to come on into PT. Feels like it is radiating up from the low part of my back, it feels like someone has really beaten me in the back. Feels like I have a charlie horse in my R hip.   PERTINENT HISTORY:  HA, HLD, knee surgery, shoulder surgery   PAIN:  Are you having pain? Yes: NPRS scale: 5-6/10 Pain location: R side of low back but feels like its radiating up  Pain description: tightness,  sharp pain, throbbing, toothache  Aggravating factors: driving too long, "just happens" otherwise, standing or sitting too long   Relieving factors: medicines from [facility], some stretches    Can get to "200/10 at worst"   PRECAUTIONS: None  RED FLAGS: None   WEIGHT BEARING RESTRICTIONS: No  FALLS:  Has patient fallen in last 6 months? No  LIVING ENVIRONMENT: Lives with: lives with their spouse Lives in: House/apartment Stairs: 2 STE, one level inside  Has following equipment at home: Single point cane  OCCUPATION: Contractor for the [employer]   PLOF: Independent, Independent with basic ADLs, Independent with gait, and Independent with transfers  PATIENT GOALS: figure out what this is/get rid of it, get back to exercise   NEXT MD VISIT: Referring 04/30/23  OBJECTIVE:   DIAGNOSTIC FINDINGS:  HA, HLD, knee surgery, shoulder surgery   PATIENT SURVEYS:  FOTO 35, predicted 64 in 11 visits  SCREENING FOR RED FLAGS: Bowel or bladder incontinence: No Spinal tumors: No Cauda equina syndrome: No Compression fracture: No Abdominal aneurysm: No  COGNITION: Overall cognitive status: Within functional limits for tasks assessed     MUSCLE LENGTH:  Flexibility WNL HS/piriformis/low back  POSTURE: rounded shoulders, forward head, increased thoracic kyphosis, flexed trunk , and rotated to the left   PALPATION: R TFL and glute/piriformis tight and very TTP  LUMBAR ROM:   AROM eval  Flexion WNL, can put palms on floor but slow movement   Extension Severe limitation, pain  Right lateral flexion WNL pain   Left lateral flexion WNL  Right rotation WNL   Left rotation WNL but tight in TFL/lateral hip stretching    (Blank rows = not tested)    LOWER EXTREMITY MMT:    MMT Right eval Left eval  Hip flexion 3 4+  Hip extension    Hip abduction 3- 3  Hip adduction    Hip internal rotation    Hip external rotation    Knee flexion 4- 4   Knee extension 4 4+  Ankle dorsiflexion 4+ 4+  Ankle plantarflexion    Ankle inversion    Ankle eversion     (  Blank rows = not tested)      TODAY'S TREATMENT:                                                                                                                              DATE:   Eval 04/06/23    PATIENT EDUCATION:  Education details: exam findings, HEP, POC, use of tennis ball for self-massage to R hip/TFL on wall, use of moist heat, avoid arching back in shower since this increases pain, possible benefits of DN  Person educated: Patient Education method: Explanation and Handouts Education comprehension: verbalized understanding and needs further education  HOME EXERCISE PROGRAM:  Access Code: [email or web address] URL: [email or web address] Date: 04/06/2023 Prepared by: Nedra Hai  Exercises - Supine Bridge  - 1 x daily - 7 x weekly - 3 sets - 10 reps - Supine Lower Trunk Rotation  - 1 x daily - 7 x weekly - 3 sets - 10 reps  ASSESSMENT:  CLINICAL IMPRESSION: Patient is a 57 y.o. F who was seen today for physical therapy evaluation and treatment for Diagnosis M54.42,M54.41 (ICD-10-CM) - Acute bilateral low back pain with bilateral sciatica. Exam with objective findings as above. It does sound like work-related and psychosocial stress may be playing a big role in her pain as well. She does have severe muscle spasms in R glute and TFL along with significant functional mm weakness. Will benefit from skilled PT services to address pain and assist in return to optimal level of function and pain free exercise.    OBJECTIVE IMPAIRMENTS: Abnormal gait, decreased mobility, difficulty walking, decreased strength, increased fascial restrictions, increased muscle spasms, postural dysfunction, and pain.   ACTIVITY LIMITATIONS: carrying, lifting, bending, sitting, standing, sleeping, bathing, toileting, dressing, reach over head, and caring for others  PARTICIPATION  LIMITATIONS: meal prep, cleaning, laundry, driving, shopping, community activity, occupation, and yard work  PERSONAL FACTORS: Age, Behavior pattern, Education, Fitness, Past/current experiences, Profession, Social background, and Time since onset of injury/illness/exacerbation are also affecting patient's functional outcome.   REHAB POTENTIAL: Good  CLINICAL DECISION MAKING: Stable/uncomplicated  EVALUATION COMPLEXITY: Low   GOALS: Goals reviewed with patient? Yes  SHORT TERM GOALS: Target date: 04/27/2023    Will be compliant with appropriate progressive HEP  Baseline: Goal status: INITIAL  2.  Severity of muscle spasms in R glute and TFL to have improved by at least 50% to assist in pain reduction Baseline:  Goal status: INITIAL  3.  Will be able to turn over in bed normally without increased pain instead of having to lift up and turn in a segmental manner  Baseline:  Goal status: INITIAL  4.  Pain to be no more than 4/10  Baseline:  Goal status: INITIAL    LONG TERM GOALS: Target date: 05/18/2023    MMT to improve by at least 1 grade in all weak groups  Baseline:  Goal status: INITIAL  2.  Will demonstrate good biomechanics for floor to waist  lifting and good ergonomics for work to prevent re-exacerbation of pain  Baseline:  Goal status: INITIAL  3.  Pain to be no more than 2/10 at worst  Baseline:  Goal status: INITIAL  4.  Will be able to perform all desired exercise and ADL activities with no increase in pain from resting levels  Baseline:  Goal status: INITIAL  5.  FOTO score to be within 5 points of predicted by time of DC  Baseline:  Goal status: INITIAL    PLAN:  PT FREQUENCY: 1-2x/week  PT DURATION: 6 weeks  PLANNED INTERVENTIONS: Therapeutic exercises, Therapeutic activity, Gait training, Patient/Family education, Self Care, Joint mobilization, Aquatic Therapy, Dry Needling, Electrical stimulation, Spinal mobilization, Cryotherapy, Moist  heat, Taping, Traction, Ultrasound, Ionotophoresis 4mg /ml Dexamethasone, Manual therapy, and Re-evaluation.  PLAN FOR NEXT SESSION: weekly HEP updates;  manual as desired and consider dry needling to R glute and TFL, functional strengthening including core, biomechanics, gentle progression into regular exercise when ready   Nedra Hai, PT, DPT 04/06/23 3:25 PM

## 2023-04-12 IMAGING — DX DG KNEE COMPLETE 4+V*R*
4 series · 5 of 5 positions shown · non-contrast
Comparison: None.

CLINICAL DATA: Right knee pain.

EXAM:
RIGHT KNEE - COMPLETE 4+ VIEW

[knee ap]
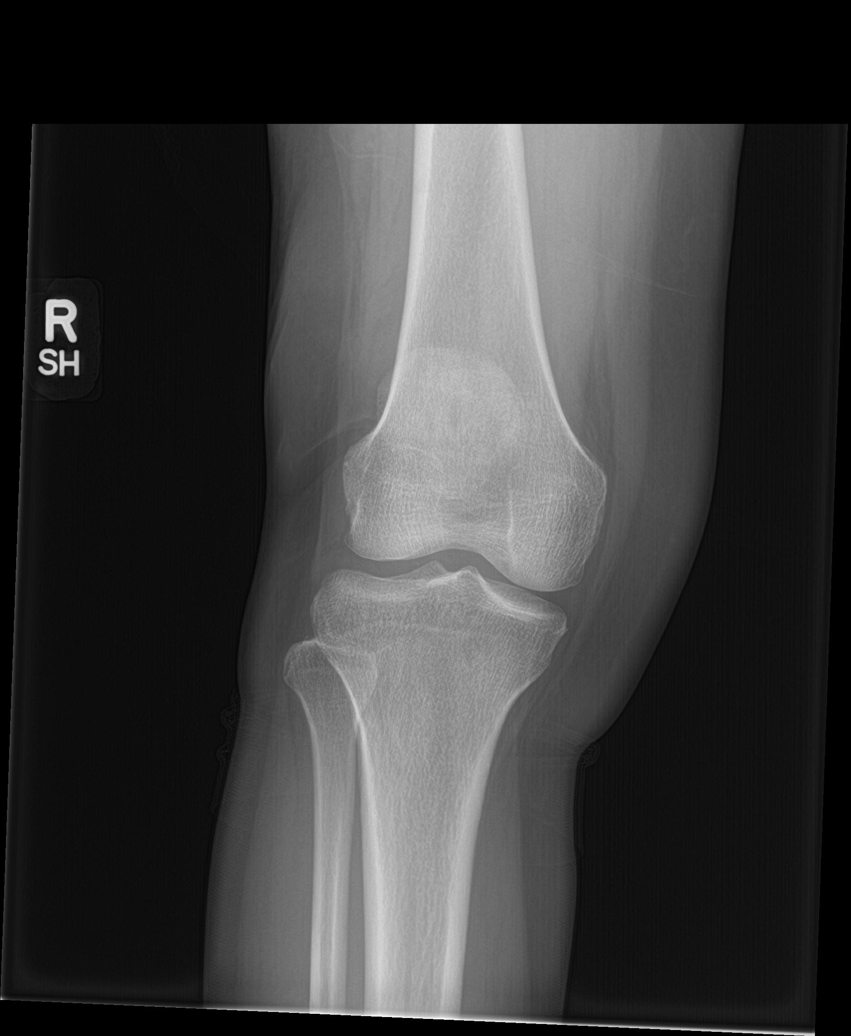

[Series 2: tunnel · 0.14mm/px · 2 of 2 slices shown]
[im 1/2]
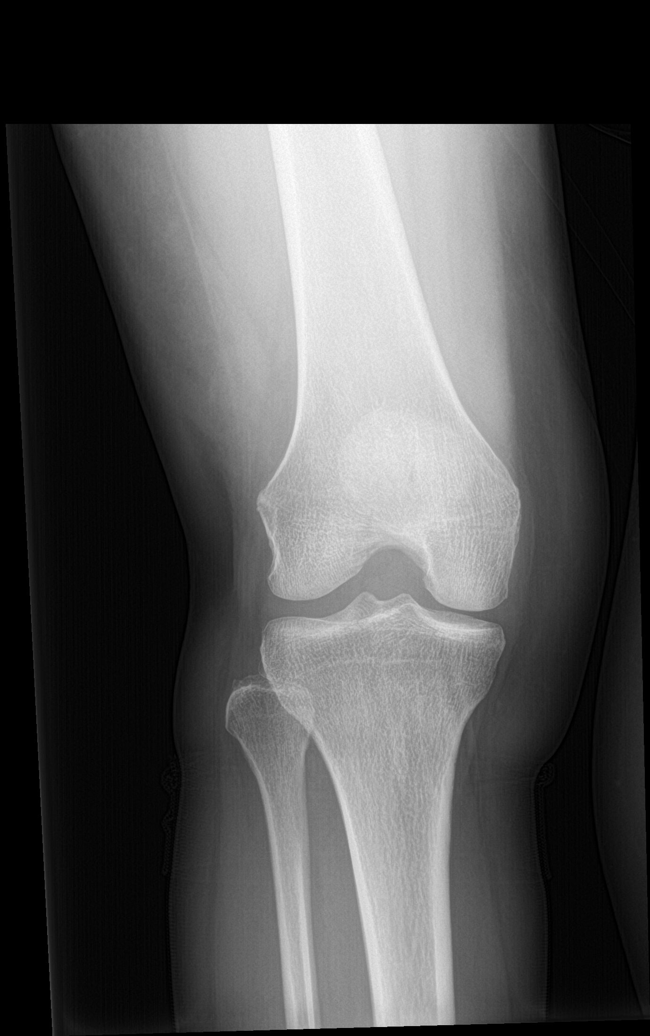
[im 2/2]
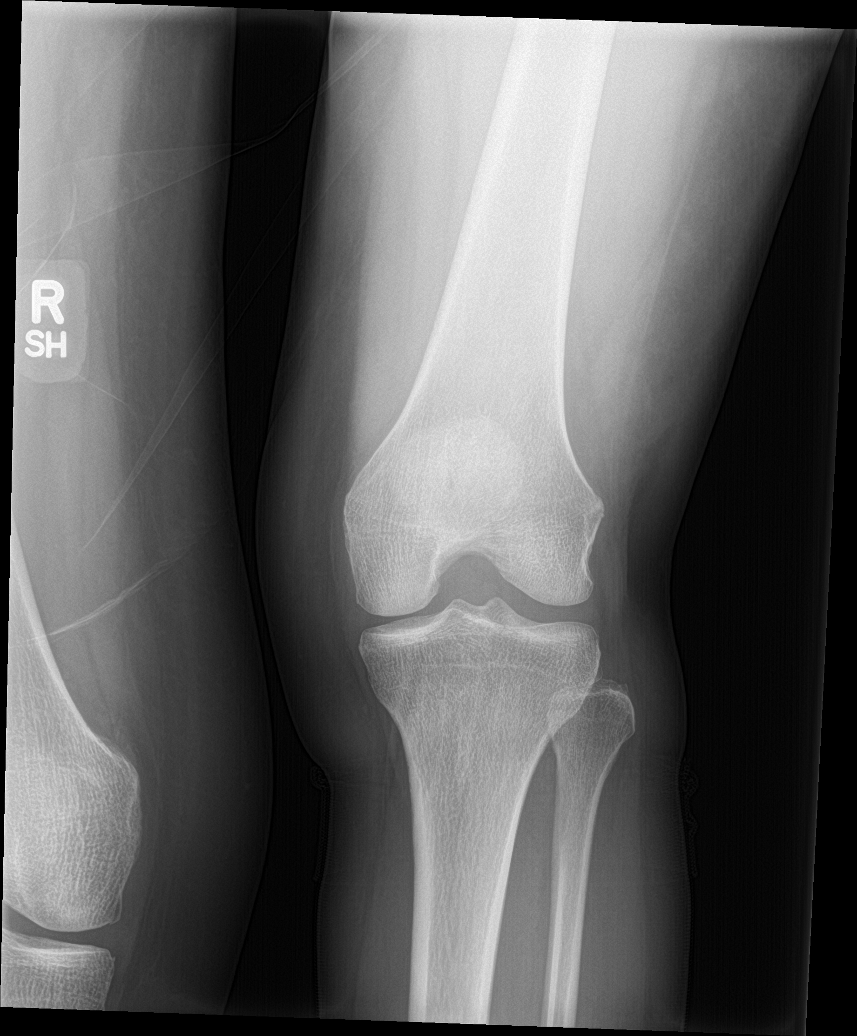

[knee lat]
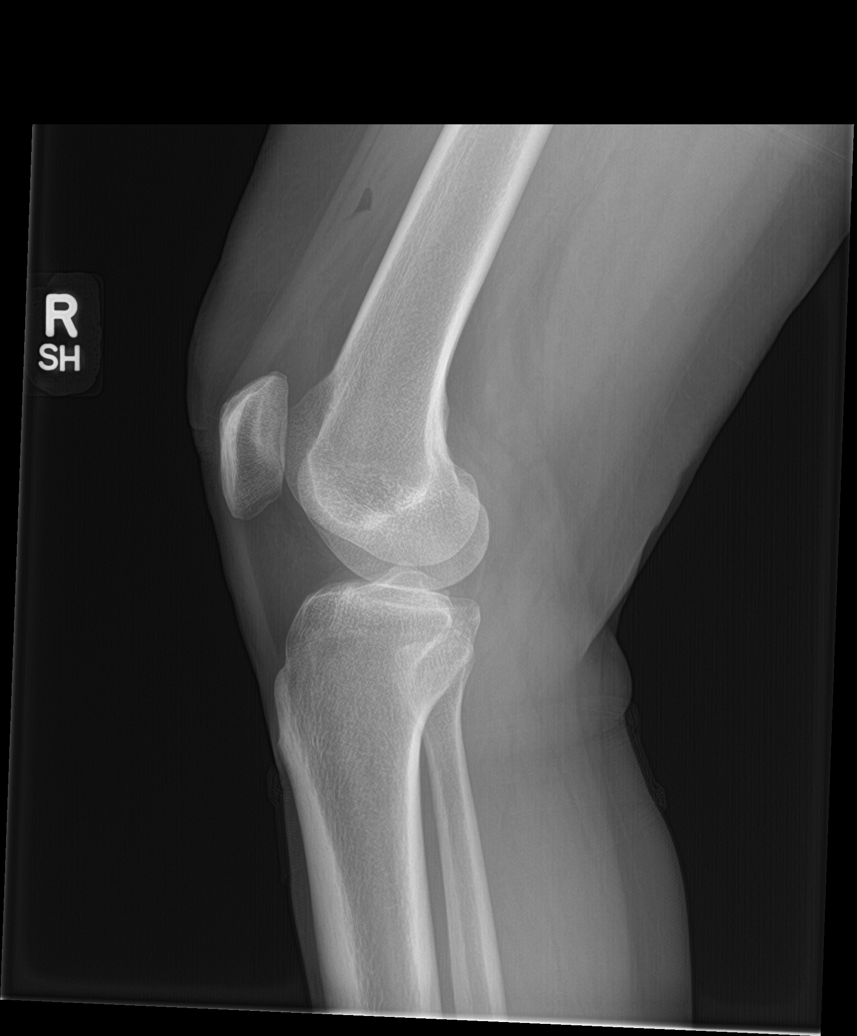

[knee sunrise]
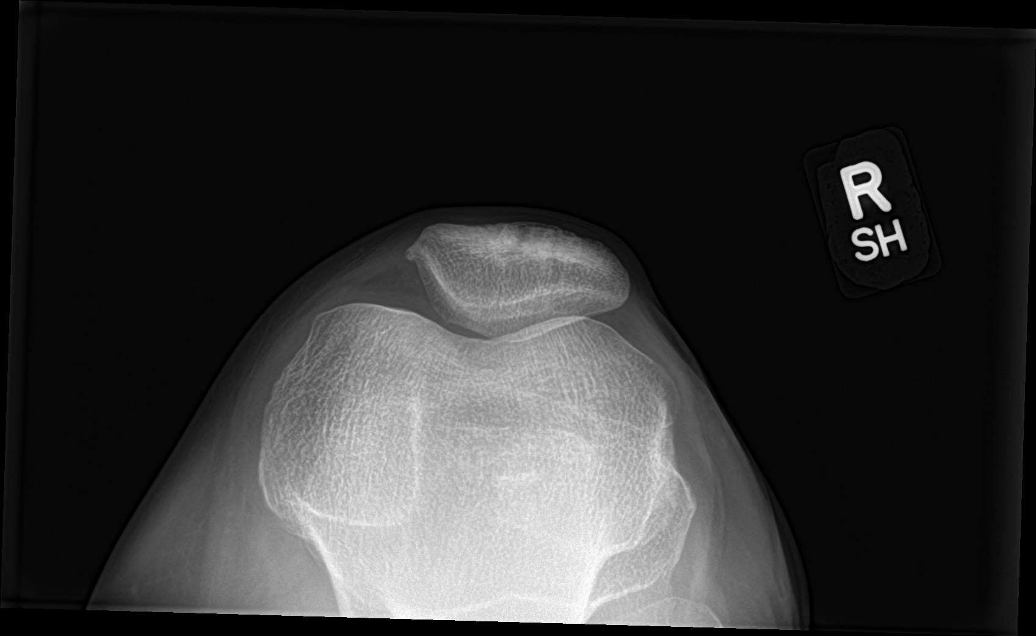

[5 of 5 positions shown; findings below may reference images not displayed]

FINDINGS: No evidence of fracture, dislocation, or joint effusion. No evidence
of arthropathy or other focal bone abnormality. There is no
significant joint effusion. There is a small area of fat attenuation
measuring 5 mm in the region of the quadriceps muscle, possibly an
intramuscular lipoma.
IMPRESSION: 1. No significant bony abnormality.
2. Questionable small intramuscular lipoma in the right quadriceps.

## 2023-04-13 ENCOUNTER — Ambulatory Visit: Payer: No Typology Code available for payment source | Admitting: Physical Therapy

## 2023-04-13 ENCOUNTER — Encounter: Payer: Self-pay | Admitting: Physical Therapy

## 2023-04-13 DIAGNOSIS — R29898 Other symptoms and signs involving the musculoskeletal system: Secondary | ICD-10-CM

## 2023-04-13 DIAGNOSIS — R293 Abnormal posture: Secondary | ICD-10-CM | POA: Diagnosis not present

## 2023-04-13 DIAGNOSIS — M5459 Other low back pain: Secondary | ICD-10-CM

## 2023-04-13 DIAGNOSIS — M6281 Muscle weakness (generalized): Secondary | ICD-10-CM

## 2023-04-20 ENCOUNTER — Encounter: Payer: No Typology Code available for payment source | Admitting: Physical Therapy

## 2023-04-27 ENCOUNTER — Encounter: Payer: No Typology Code available for payment source | Admitting: Physical Therapy

## 2023-04-30 ENCOUNTER — Ambulatory Visit (HOSPITAL_BASED_OUTPATIENT_CLINIC_OR_DEPARTMENT_OTHER): Payer: No Typology Code available for payment source | Admitting: Student

## 2023-05-04 ENCOUNTER — Encounter: Payer: No Typology Code available for payment source | Admitting: Physical Therapy

## 2023-07-10 NOTE — Progress Notes (Unsigned)
   [name], PhD, LAT, ATC acting as a scribe for Clementeen Graham, MD.  Stephanie Hoffman is a 57 y.o. female who presents to [facility] today for L shoulder pain. She was previously a pt of Dr. Jordan Likes.  Today, pt c/o L shoulder pain x ***. Pt locates pain to ***  Radiates: Aggravates: Treatments tried:  Pertinent review of systems: ***  Relevant historical information: ***   Exam:  LMP 02/12/2020  General: Well Developed, well nourished, and in no acute distress.   MSK: ***    Lab and Radiology Results No results found for this or any previous visit (from the past 72 hour(s)). No results found.     Assessment and Plan: 57 y.o. female with ***   PDMP not reviewed this encounter. No orders of the defined types were placed in this encounter.  No orders of the defined types were placed in this encounter.    Discussed warning signs or symptoms. Please see discharge instructions. Patient expresses understanding.   ***

## 2023-07-11 ENCOUNTER — Ambulatory Visit: Payer: No Typology Code available for payment source | Admitting: Family Medicine

## 2023-07-11 ENCOUNTER — Encounter: Payer: Self-pay | Admitting: Family Medicine

## 2023-07-11 ENCOUNTER — Ambulatory Visit (INDEPENDENT_AMBULATORY_CARE_PROVIDER_SITE_OTHER): Payer: No Typology Code available for payment source

## 2023-07-11 ENCOUNTER — Other Ambulatory Visit: Payer: Self-pay

## 2023-07-11 VITALS — BP 102/60 | HR 77 | Ht 61.0 in | Wt 111.0 lb

## 2023-07-11 DIAGNOSIS — M25512 Pain in left shoulder: Secondary | ICD-10-CM | POA: Diagnosis not present

## 2023-07-11 DIAGNOSIS — G8929 Other chronic pain: Secondary | ICD-10-CM | POA: Diagnosis not present

## 2023-07-11 NOTE — Patient Instructions (Addendum)
Thank you for coming in today.   You received an injection today. Seek immediate medical attention if the joint becomes red, extremely painful, or is oozing fluid.  Please get an Xray today before you leave   Please use Voltaren gel (Generic Diclofenac Gel) up to 4x daily for pain as needed.  This is available over-the-counter as both the name brand Voltaren gel and the generic diclofenac gel.    Work on home exercises. If not getting better next step is PT. Let me know.   View at [email or web address] using code: [email or web address]

## 2023-07-30 NOTE — Progress Notes (Signed)
Left shoulder x-ray shows a little bit of arthritis.

## 2023-08-13 LAB — HM MAMMOGRAPHY

## 2023-08-14 ENCOUNTER — Encounter: Payer: Self-pay | Admitting: Internal Medicine

## 2023-11-14 ENCOUNTER — Encounter: Payer: Self-pay | Admitting: Internal Medicine

## 2023-11-14 NOTE — Progress Notes (Signed)
 No chief complaint on file.   HPI: Patient  Stephanie Hoffman  58 y.o. comes in today for Preventive Health Care visit  Last pv 3 24  Ms issues hand and shoulder   Health Maintenance  Topic Date Due   COVID-19 Vaccine (4 - 2024-25 season) 04/01/2023   DTaP/Tdap/Td (4 - Td or Tdap) 09/25/2023   INFLUENZA VACCINE  02/29/2024   MAMMOGRAM  08/12/2024   Colonoscopy  04/30/2025   Cervical Cancer Screening (HPV/Pap Cotest)  10/18/2027   Hepatitis C Screening  Completed   HIV Screening  Completed   Zoster Vaccines- Shingrix  Completed   HPV VACCINES  Aged Out   Meningococcal B Vaccine  Aged Out   Health Maintenance Review LIFESTYLE:  Exercise:   Tobacco/ETS: Alcohol:  Sugar beverages: Sleep: Drug use: no HH of  Work:    ROS:  GEN/ HEENT: No fever, significant weight changes sweats headaches vision problems hearing changes, CV/ PULM; No chest pain shortness of breath cough, syncope,edema  change in exercise tolerance. GI /GU: No adominal pain, vomiting, change in bowel habits. No blood in the stool. No significant GU symptoms. SKIN/HEME: ,no acute skin rashes suspicious lesions or bleeding. No lymphadenopathy, nodules, masses.  NEURO/ PSYCH:  No neurologic signs such as weakness numbness. No depression anxiety. IMM/ Allergy: No unusual infections.  Allergy .   REST of 12 system review negative except as per HPI   Past Medical History:  Diagnosis Date   Acute right-sided low back pain with right-sided sciatica 12/26/2018   Angio-edema    Gallstones    removed 2000   Headache(784.0)    Heart murmur    Hyperlipidemia    Pneumonia    Recurrent upper respiratory infection (URI)    Urticaria    Varicose veins     Past Surgical History:  Procedure Laterality Date   CESAREAN SECTION     CHOLECYSTECTOMY N/A 2007   COLONOSCOPY  01/27/2015   Dr.Gessner   KNEE SURGERY     left torn ligament 2007   SHOULDER SURGERY     right and left   WISDOM TOOTH EXTRACTION  Bilateral 1997    Family History  Problem Relation Age of Onset   Arthritis Mother    Diabetes Mother    Hyperlipidemia Mother    Heart disease Mother    Colon cancer Mother 79   Hypertension Father    Prostate cancer Maternal Grandfather 73   Stroke Paternal Grandfather    Stomach cancer Maternal Grandmother 58   Esophageal cancer Neg Hx    Rectal cancer Neg Hx    Colon polyps Neg Hx     Social History   Socioeconomic History   Marital status: Married    Spouse name: Not on file   Number of children: Not on file   Years of education: Not on file   Highest education level: Not on file  Occupational History   Not on file  Tobacco Use   Smoking status: Never   Smokeless tobacco: Never  Vaping Use   Vaping status: Never Used  Substance and Sexual Activity   Alcohol use: No   Drug use: No   Sexual activity: Yes    Comment: husband had vastectomy  Other Topics Concern   Not on file  Social History Narrative   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     Cyanocobalamin (VITAMIN B-12 PO) Take by mouth.     diphenhydramine-acetaminophen (TYLENOL PM) 25-500 MG TABS Take 1 tablet by mouth at bedtime as needed (sleep).      Menaquinone-7 (VITAMIN K2 PO) Take by mouth.     methylPREDNISolone (MEDROL DOSEPAK) 4 MG TBPK tablet Take per packet instructions 21 tablet 0   Multiple Vitamins-Minerals (WOMENS MULTIVITAMIN PO) Take by mouth.     Potassium 99 MG TABS Take 99 mg by mouth daily.     Turmeric (QC TUMERIC COMPLEX PO) Take by mouth.      No facility-administered medications prior to visit.     EXAM:  LMP 02/12/2020   There is no height or weight on file to calculate BMI. Wt Readings from Last 3 Encounters:  07/11/23 111 lb (50.3 kg)  10/18/22 106 lb 1.6 oz (48.1 kg)  03/08/22 102 lb (46.3 kg)    Physical Exam: Vital signs reviewed [id number] is a well-developed well-nourished alert cooperative    who appearsr stated age in no acute distress.  HEENT: normocephalic atraumatic , Eyes: PERRL EOM's full, conjunctiva clear, Nares: paten,t no deformity discharge or tenderness., Ears: no deformity EAC's clear TMs with normal landmarks. Mouth: clear OP, no lesions, edema.  Moist mucous membranes. Dentition in adequate repair. NECK: supple without masses, thyromegaly or bruits. CHEST/PULM:  Clear to auscultation and percussion breath sounds equal no wheeze , rales or rhonchi. No chest wall deformities or tenderness. Breast: normal by inspection . No dimpling, discharge, masses, tenderness or discharge . CV: PMI is nondisplaced, S1 S2 no gallops, murmurs, rubs. Peripheral pulses are full without delay.No JVD .  ABDOMEN: Bowel sounds normal nontender  No guard or rebound, no hepato splenomegal no CVA tenderness.  No hernia. Extremtities:  No clubbing cyanosis or edema, no acute joint swelling or redness no focal atrophy NEURO:  Oriented x3, cranial nerves 3-12 appear to be intact, no obvious focal weakness,gait within normal limits no abnormal reflexes or asymmetrical SKIN: No acute rashes normal turgor, color, no bruising or petechiae. PSYCH: Oriented, good eye contact, no obvious depression anxiety, cognition and judgment appear normal. LN: no cervical axillary  adenopathy  Lab Results  Component Value Date   WBC 5.8 10/18/2022   HGB 13.7 10/18/2022   HCT 41.0 10/18/2022   PLT 280.0 10/18/2022   GLUCOSE 81 10/18/2022   CHOL 243 (H) 10/18/2022   TRIG 61.0 10/18/2022   HDL 93.90 10/18/2022   LDLDIRECT 136.9 08/27/2013    LDLCALC 137 (H) 10/18/2022   ALT 10 10/18/2022   AST 18 10/18/2022   NA 141 10/18/2022   K 3.9 10/18/2022   CL 104 10/18/2022   CREATININE 0.82 10/18/2022   BUN 22 10/18/2022   CO2 26 10/18/2022   TSH 0.89 10/18/2022   HGBA1C 5.6 10/18/2022   MICROALBUR 1.0 07/17/2016    BP Readings from Last 3 Encounters:  07/11/23 102/60  10/18/22 114/78  03/08/22 130/70    Lab plan reviewed with patient   ASSESSMENT AND PLAN:  Discussed the following assessment and plan:    ICD-10-CM   1. Visit  for preventive health examination  Z00.00     2. Hyperlipidemia, unspecified hyperlipidemia type  E78.5     3. Medication management  Z79.899      No follow-ups on file.  Patient Care Team: Jasmene Goswami, Joaquim Muir, MD as PCP - Terrial Fetch, MD (Orthopedic Surgery) Rober Chimera, MD as Consulting Physician (Orthopedic Surgery) Alanson Alliance, MD as Consulting Physician (Rheumatology) There are no Patient Instructions on file for this visit.  Abbigayle Toole K. Dailah Opperman M.D.

## 2023-11-15 ENCOUNTER — Encounter: Payer: Self-pay | Admitting: Internal Medicine

## 2023-11-15 ENCOUNTER — Ambulatory Visit (INDEPENDENT_AMBULATORY_CARE_PROVIDER_SITE_OTHER): Admitting: Internal Medicine

## 2023-11-15 VITALS — BP 112/70 | HR 85 | Temp 98.6°F | Ht 61.2 in | Wt 105.2 lb

## 2023-11-15 DIAGNOSIS — Z23 Encounter for immunization: Secondary | ICD-10-CM

## 2023-11-15 DIAGNOSIS — Z79899 Other long term (current) drug therapy: Secondary | ICD-10-CM

## 2023-11-15 DIAGNOSIS — Z0000 Encounter for general adult medical examination without abnormal findings: Secondary | ICD-10-CM

## 2023-11-15 DIAGNOSIS — E785 Hyperlipidemia, unspecified: Secondary | ICD-10-CM | POA: Diagnosis not present

## 2023-11-15 LAB — CBC WITH DIFFERENTIAL/PLATELET
Basophils Absolute: 0 10*3/uL (ref 0.0–0.1)
Basophils Relative: 0.6 % (ref 0.0–3.0)
Eosinophils Absolute: 0.1 10*3/uL (ref 0.0–0.7)
Eosinophils Relative: 1.4 % (ref 0.0–5.0)
HCT: 41.7 % (ref 36.0–46.0)
Hemoglobin: 14.2 g/dL (ref 12.0–15.0)
Lymphocytes Relative: 31.5 % (ref 12.0–46.0)
Lymphs Abs: 1.5 10*3/uL (ref 0.7–4.0)
MCHC: 34 g/dL (ref 30.0–36.0)
MCV: 87.5 fl (ref 78.0–100.0)
Monocytes Absolute: 0.4 10*3/uL (ref 0.1–1.0)
Monocytes Relative: 8.9 % (ref 3.0–12.0)
Neutro Abs: 2.8 10*3/uL (ref 1.4–7.7)
Neutrophils Relative %: 57.6 % (ref 43.0–77.0)
Platelets: 279 10*3/uL (ref 150.0–400.0)
RBC: 4.76 Mil/uL (ref 3.87–5.11)
RDW: 12.3 % (ref 11.5–15.5)
WBC: 4.8 10*3/uL (ref 4.0–10.5)

## 2023-11-15 LAB — HEMOGLOBIN A1C: Hgb A1c MFr Bld: 5.8 % (ref 4.6–6.5)

## 2023-11-15 LAB — TSH: TSH: 0.86 u[IU]/mL (ref 0.35–5.50)

## 2023-11-15 LAB — COMPREHENSIVE METABOLIC PANEL WITH GFR
ALT: 13 U/L (ref 0–35)
AST: 17 U/L (ref 0–37)
Albumin: 4.9 g/dL (ref 3.5–5.2)
Alkaline Phosphatase: 70 U/L (ref 39–117)
BUN: 22 mg/dL (ref 6–23)
CO2: 30 meq/L (ref 19–32)
Calcium: 10 mg/dL (ref 8.4–10.5)
Chloride: 102 meq/L (ref 96–112)
Creatinine, Ser: 0.85 mg/dL (ref 0.40–1.20)
GFR: 75.81 mL/min (ref >60.00)
Glucose, Bld: 99 mg/dL (ref 70–99)
Potassium: 4.2 meq/L (ref 3.5–5.1)
Sodium: 140 meq/L (ref 135–145)
Total Bilirubin: 0.7 mg/dL (ref 0.2–1.2)
Total Protein: 7 g/dL (ref 6.0–8.3)

## 2023-11-15 LAB — LIPID PANEL
Cholesterol: 221 mg/dL — ABNORMAL HIGH (ref 0–200)
HDL: 85.4 mg/dL (ref >39.00)
LDL Cholesterol: 126 mg/dL — ABNORMAL HIGH (ref 0–99)
NonHDL: 135.57
Total CHOL/HDL Ratio: 3
Triglycerides: 49 mg/dL (ref 0.0–149.0)
VLDL: 9.8 mg/dL (ref 0.0–40.0)

## 2023-11-15 NOTE — Patient Instructions (Addendum)
 Good to see you today .  Exam  good today . Continue lifestyle intervention healthy eating and exercise .  Tdap today   Lab update

## 2023-11-19 ENCOUNTER — Ambulatory Visit: Admitting: Family Medicine

## 2023-11-22 LAB — LIPOPROTEIN A (LPA): Lipoprotein (a): 77 nmol/L — ABNORMAL HIGH (ref <75)

## 2024-01-17 ENCOUNTER — Ambulatory Visit: Payer: Self-pay

## 2024-01-17 NOTE — Telephone Encounter (Signed)
 FYI Only or Action Required?: FYI only for provider.  Patient was last seen in primary care on 11/15/2023 by Panosh, Joaquim Muir, MD. Called Nurse Triage reporting Cough. Symptoms began several months ago. Interventions attempted: OTC medications: Flonase and Zantac and Rest, hydration, or home remedies. Symptoms are: unchanged.  Triage Disposition: See PCP When Office is Open (Within 3 Days)  Patient/caregiver understands and will follow disposition?: Yes      Copied from CRM [id number]. Topic: Clinical - Medical Advice >> Jan 17, 2024 10:15 AM [place] A wrote: Reason for CRM: Patient has constant cough that is not gone away-would like advice, please call Reason for Disposition  Cough has been present for > 3 weeks  Answer Assessment - Initial Assessment Questions 1. ONSET: When did the cough begin?      Started in [place], was thought to be allergies so she treated with Flonase and Zyrtek  2. SEVERITY: How bad is the cough today?      Persistent, triggered with heat and certain fragrances. Alternates between dry and productive  3. SPUTUM: Describe the color of your sputum (none, dry cough; clear, white, yellow, green)     Clear  4. HEMOPTYSIS: Are you coughing up any blood? If so ask: How much? (flecks, streaks, tablespoons, etc.)     No  5. DIFFICULTY BREATHING: Are you having difficulty breathing? If Yes, ask: How bad is it? (e.g., mild, moderate, severe)    - MILD: No SOB at rest, mild SOB with walking, speaks normally in sentences, can lie down, no retractions, pulse < 100.    - MODERATE: SOB at rest, SOB with minimal exertion and prefers to sit, cannot lie down flat, speaks in phrases, mild retractions, audible wheezing, pulse 100-120.    - SEVERE: Very SOB at rest, speaks in single words, struggling to breathe, sitting hunched forward, retractions, pulse > 120      Sometimes I feel like I am wheezing or shortness of breath   6. FEVER: Do you have a fever? If  Yes, ask: What is your temperature, how was it measured, and when did it start?     No  7. CARDIAC HISTORY: Do you have any history of heart disease? (e.g., heart attack, congestive heart failure)      No  8. LUNG HISTORY: Do you have any history of lung disease?  (e.g., pulmonary embolus, asthma, emphysema)     No  9. PE RISK FACTORS: Do you have a history of blood clots? (or: recent major surgery, recent prolonged travel, bedridden)     No  10. OTHER SYMPTOMS: Do you have any other symptoms? (e.g., runny nose, wheezing, chest pain)       No  11. PREGNANCY: Is there any chance you are pregnant? When was your last menstrual period?       No  12. TRAVEL: Have you traveled out of the country in the last month? (e.g., travel history, exposures)       No  Protocols used: Cough - Acute Productive-A-AH

## 2024-01-18 ENCOUNTER — Ambulatory Visit (INDEPENDENT_AMBULATORY_CARE_PROVIDER_SITE_OTHER): Admitting: Family Medicine

## 2024-01-18 ENCOUNTER — Encounter: Payer: Self-pay | Admitting: Family Medicine

## 2024-01-18 VITALS — BP 110/60 | HR 63 | Temp 97.9°F | Ht 62.0 in | Wt 109.0 lb

## 2024-01-18 DIAGNOSIS — J302 Other seasonal allergic rhinitis: Secondary | ICD-10-CM

## 2024-01-18 DIAGNOSIS — R0982 Postnasal drip: Secondary | ICD-10-CM | POA: Diagnosis not present

## 2024-01-18 DIAGNOSIS — R059 Cough, unspecified: Secondary | ICD-10-CM

## 2024-01-18 NOTE — Progress Notes (Signed)
 Established Patient Office Visit   Subjective  Patient ID: Stephanie Hoffman, female    DOB: 06-Aug-1965  Age: 58 y.o. MRN: [id number]  Chief Complaint  Patient presents with   Cough    Persistent cough since April; tried meds for allergies, acid reflux, mucinex, dayquil but still coughing; not all the time; sometimes with phlegm but mostly dry; noticed BP goes up with cough    Patient is a 58 year old female followed by Dr. Ethel Henry and seen for ongoing concern.  Patient endorses a cough since March, worse at night.  Advised likely 2/2 allergies or GERD.  Patient denies overt heartburn symptoms.  Tried DayQuil, NyQuil, Flonase, Zantac, Benadryl, and Mucinex.  Also notes tickle in throat, postnasal drainage, ear pain/pressure and headaches as well as rhinorrhea intermittently.    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  11/17/2013   Neck pain on right side 11/17/2013   Family hx of colon cancer 07/12/2012   ARTHRITIS, LEFT SHOULDER 04/13/2010   BREAST MASS, LEFT 02/04/2010   HEADACHE 05/02/2007   Past Medical History:  Diagnosis Date   Acute right-sided low back pain with right-sided sciatica 12/26/2018   Angio-edema    Gallstones    removed 2000   Headache(784.0)    Heart murmur    Hyperlipidemia    Pneumonia    Recurrent upper respiratory infection (URI)    Urticaria    Varicose veins    Past Surgical History:  Procedure Laterality  Date   CESAREAN SECTION     CHOLECYSTECTOMY N/A 2007   COLONOSCOPY  01/27/2015   Dr.Gessner   KNEE SURGERY     left torn ligament 2007   SHOULDER SURGERY     right and left   WISDOM TOOTH EXTRACTION Bilateral 1997   Social History   Tobacco Use   Smoking status: Never   Smokeless tobacco: Never  Vaping Use   Vaping status: Never Used  Substance Use Topics   Alcohol use: No   Drug use: No   Family History  Problem Relation Age of Onset   Arthritis Mother    Diabetes Mother    Hyperlipidemia Mother    Heart disease Mother    Colon cancer Mother 13   Hypertension Father    Prostate cancer Maternal Grandfather 52   Stroke Paternal Grandfather    Stomach cancer Maternal Grandmother 38   Esophageal cancer Neg Hx    Rectal cancer Neg Hx    Colon polyps Neg Hx    Allergies  Allergen Reactions   Amoxicillin Diarrhea   Coconut (Cocos Nucifera) Itching   Codeine Itching   Magnesium-Containing Compounds Itching   Penicillins Rash    Has patient had a PCN reaction causing immediate rash, facial/tongue/throat swelling, SOB or lightheadedness with hypotension: No Has patient had a PCN reaction causing severe rash involving mucus membranes or skin necrosis: No Has patient had a PCN reaction that required hospitalization: No Has patient had a  PCN reaction occurring within the last 10 years: No If all of the above answers are NO, then may proceed with Cephalosporin use.    Vitamin E-Safflower Oil Itching   Other     Runny nose, tickle in throat,cough,  sneezing. Allergy to pollens.     ROS Negative unless stated above    Objective:     BP 110/60   Pulse 63   Temp 97.9 F (36.6 C)   Ht 5' 2 (1.575 m)   Wt 109 lb (49.4 kg)   LMP 02/12/2020   SpO2 95%   BMI 19.94 kg/m  BP Readings from Last 3 Encounters:  01/18/24 110/60  11/15/23 112/70  07/11/23 102/60   Wt Readings from Last 3 Encounters:  01/18/24 109 lb (49.4 kg)  11/15/23 105 lb 3.2 oz (47.7 kg)   07/11/23 111 lb (50.3 kg)      Physical Exam Constitutional:      General: She is not in acute distress.    Appearance: Normal appearance.  HENT:     Head: Normocephalic and atraumatic.     Nose: Nose normal.     Mouth/Throat:     Mouth: Mucous membranes are moist.   Cardiovascular:     Rate and Rhythm: Normal rate and regular rhythm.     Heart sounds: Normal heart sounds. No murmur heard.    No gallop.  Pulmonary:     Effort: Pulmonary effort is normal. No respiratory distress.     Breath sounds: Normal breath sounds. No wheezing, rhonchi or rales.     Comments: No cough.  Skin:    General: Skin is warm and dry.   Neurological:     Mental Status: She is alert and oriented to person, place, and time.     No results found for any visits on 01/18/24.    Assessment & Plan:   Post-nasal drainage  Cough, unspecified type  Seasonal allergies  Cough likely 2/2 post-nasal drainage due to seasonal allergies.  Given samples of zyrtec.  If not improvement try a different OTC antihistamine.  F/u with pcp.  Return if symptoms worsen or fail to improve.   Viola Greulich, MD

## 2024-03-13 ENCOUNTER — Ambulatory Visit (INDEPENDENT_AMBULATORY_CARE_PROVIDER_SITE_OTHER): Admitting: Internal Medicine

## 2024-03-13 ENCOUNTER — Encounter: Payer: Self-pay | Admitting: Internal Medicine

## 2024-03-13 VITALS — BP 110/74 | HR 65 | Temp 98.2°F | Ht 62.0 in | Wt 109.6 lb

## 2024-03-13 DIAGNOSIS — R053 Chronic cough: Secondary | ICD-10-CM

## 2024-03-13 MED ORDER — PREDNISONE 50 MG PO TABS: 50.0000 mg | ORAL_TABLET | Freq: Every day | ORAL | 0 refills | Status: AC

## 2024-03-13 MED ORDER — ALBUTEROL SULFATE HFA 108 (90 BASE) MCG/ACT IN AERS: 2.0000 | INHALATION_SPRAY | Freq: Four times a day (QID) | RESPIRATORY_TRACT | 0 refills | Status: DC | PRN

## 2024-03-13 NOTE — Progress Notes (Signed)
 Chief Complaint  Patient presents with   Cough    Pt c/o coughing since March. Tried  flonase. Didn't work. Was evaluated- was told sx was from allergy and given sample of zyrtec. Couldn't stay awake then switch to claritin. Then switch to allergra. Still coughing. Tried dysulum and Robitussin. Coughing up cloudy mucus and has nasal drainage. Feels tickle in throat. Constant cough. Denied new sx.     HPI: Stephanie Hoffman 58 y.o. come in for  above   awakens at night   a lot if coughing too much  throat .   wakes up husband   from cough  .  Ongoingx 4+ months  no associ with uri fever  pain although some left era fullness  using all trials of meds and flonase saline tec.   Had a prolong cough  in 2021 and had antibiotic pred albuterol  other cough meds ended up allergy eval and negative. Not sure what made it better thinks albuterol  helped  Works PO no exposures new noted  ROS: See pertinent positives and negatives per HPI.  Past Medical History:  Diagnosis Date   Acute right-sided low back pain with right-sided sciatica 12/26/2018   Angio-edema    Gallstones    removed 2000   Headache(784.0)    Heart murmur    Hyperlipidemia    Pneumonia    Recurrent upper respiratory infection (URI)    Urticaria    Varicose veins     Family History  Problem Relation Age of Onset   Arthritis Mother    Diabetes Mother    Hyperlipidemia Mother    Heart disease Mother    Colon cancer Mother 65   Hypertension Father    Prostate cancer Maternal Grandfather 56   Stroke Paternal Grandfather    Stomach cancer Maternal Grandmother 1   Esophageal cancer Neg Hx    Rectal cancer Neg Hx    Colon polyps Neg Hx     Social History   Socioeconomic History   Marital status: Married    Spouse name: Not on file   Number of children: Not on file   Years of education: Not on file   Highest education level: Some college, no degree  Occupational History   Not on file  Tobacco Use   Smoking  status: Never   Smokeless tobacco: Never  Vaping Use   Vaping status: Never Used  Substance and Sexual Activity   Alcohol use: No   Drug use: No   Sexual activity: Yes    Comment: husband had vastectomy  Other Topics Concern   Not on file  Social History Narrative   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   Married   Regular exercise- yes   HH of 3 no pet s now    not ets   Social Drivers of Corporate investment banker Strain: Low Risk  (11/18/2023)   Overall Financial Resource Strain (CARDIA)    Difficulty of Paying Living Expenses: Not hard at all  Food Insecurity: No Food Insecurity (11/18/2023)   Hunger Vital Sign    Worried About Running Out of Food in the Last Year: Never true    Ran Out of Food in the Last Year: Never true  Transportation Needs: No Transportation Needs (11/18/2023)   PRAPARE - Administrator, Civil Service (Medical): No    Lack of Transportation (Non-Medical): No  Physical Activity: Insufficiently Active (11/18/2023)   Exercise Vital  Sign    Days of Exercise per Week: 4 days    Minutes of Exercise per Session: 30 min  Stress: No Stress Concern Present (11/18/2023)   [employer] of Occupational Health - Occupational Stress Questionnaire    Feeling of Stress : Not at all  Social Connections: Moderately Integrated (11/18/2023)   Social Connection and Isolation Panel    Frequency of Communication with Friends and Family: Once a week    Frequency of Social Gatherings with Friends and Family: Twice a week    Attends Religious Services: Never    Database administrator or Organizations: Yes    Attends Banker Meetings: 1 to 4 times per year    Marital Status: Married    Outpatient Medications Prior to Visit  Medication Sig Dispense Refill   Cholecalciferol (VITAMIN D3 PO) Take by mouth.     Cyanocobalamin (VITAMIN B-12 PO) Take by mouth.     diphenhydramine-acetaminophen (TYLENOL PM) 25-500 MG TABS  Take 1 tablet by mouth at bedtime as needed (sleep).      Menaquinone-7 (VITAMIN K2 PO) Take by mouth.     Misc Natural Products (BEET ROOT PO) Take by mouth daily.     Multiple Vitamins-Minerals (WOMENS MULTIVITAMIN PO) Take by mouth.     Potassium 99 MG TABS Take 99 mg by mouth daily.     Turmeric (QC TUMERIC COMPLEX PO) Take by mouth.     NON FORMULARY Heal and Joint supplement (Patient not taking: Reported on 03/13/2024)     No facility-administered medications prior to visit.     EXAM:  BP 110/74 (BP Location: Right Arm, Patient Position: Sitting, Cuff Size: Normal)   Pulse 65   Temp 98.2 F (36.8 C) (Oral)   Ht 5' 2 (1.575 m)   Wt 109 lb 9.6 oz (49.7 kg)   LMP 02/12/2020   SpO2 97%   BMI 20.05 kg/m   Body mass index is 20.05 kg/m.  GENERAL: vitals reviewed and listed above, alert, oriented, appears well hydrated and in no acute distress HEENT: atraumatic, conjunctiva  clear, no obvious abnormalities on inspection of external nose and ears  nasla tubinates 2+ no dc pain tmx clear OP : no lesion edema or exudate  NECK: no obvious masses on inspection palpation  LUNGS: clear to auscultation bilaterally, no wheezes, rales or rhonchi, good air movement mild raspy voice no tstridor  CV: HRRR, no clubbing cyanosis or  peripheral edema nl cap refill  good air movement  no cough  on exam  MS: moves all extremities without noticeable focal  abnormality PSYCH: pleasant and cooperative, no obvious depression or anxiety Lab Results  Component Value Date   WBC 4.8 11/15/2023   HGB 14.2 11/15/2023   HCT 41.7 11/15/2023   PLT 279.0 11/15/2023   GLUCOSE 99 11/15/2023   CHOL 221 (H) 11/15/2023   TRIG 49.0 11/15/2023   HDL 85.40 11/15/2023   LDLDIRECT 136.9 08/27/2013   LDLCALC 126 (H) 11/15/2023   ALT 13 11/15/2023   AST 17 11/15/2023   NA 140 11/15/2023   K 4.2 11/15/2023   CL 102 11/15/2023   CREATININE 0.85 11/15/2023   BUN 22 11/15/2023   CO2 30 11/15/2023   TSH 0.86  11/15/2023   HGBA1C 5.8 11/15/2023   BP Readings from Last 3 Encounters:  03/13/24 110/74  01/18/24 110/60  11/15/23 112/70    ASSESSMENT AND PLAN:  Discussed the following assessment and plan:  Cough, persistent - Plan: Ambulatory referral  to Pulmonology Cannot take gabapentin   ( cns se)    On going cough  including nocturnal despite  allergy meds support)  and has happended in past  thinks predisone and albuterol  may have helped   but never had a dx .  No obvious  infection noted  . Chest clear today  Ref to pulmonary for help ? If  asthmatic variant or upper airway cough syndrome ( although lower sx and nocturnal also)  Xray service in house not avaiable today . -Patient advised to return or notify health care team  if  new concerns arise.  Patient Instructions  Exam is reassuring .   Since  you had neg allergy work  up.   And  allergy meds flonase etc not helping  Will get pulmonary team for help . ? If you could  have a variant  such as cough variant  asthma. Today will rx  prednisnone and albuterol  inhaler     Apolinar K. Ruthanna Macchia M.D.

## 2024-03-13 NOTE — Patient Instructions (Signed)
 Exam is reassuring .   Since  you had neg allergy work  up.   And  allergy meds flonase etc not helping  Will get pulmonary team for help . ? If you could  have a variant  such as cough variant  asthma. Today will rx  prednisnone and albuterol  inhaler

## 2024-04-03 ENCOUNTER — Telehealth: Payer: Self-pay | Admitting: Internal Medicine

## 2024-04-03 NOTE — Telephone Encounter (Unsigned)
 Copied from CRM [id number]. Topic: Referral - Question >> Apr 02, 2024  4:28 PM Stephanie Hoffman wrote: Reason for CRM: Patient called in stated that she will need Dr.Panosh to resend her referral to  [facility] [address] Stephanie Hoffman [phone]

## 2024-04-09 ENCOUNTER — Other Ambulatory Visit: Payer: Self-pay | Admitting: Internal Medicine

## 2024-04-10 NOTE — Telephone Encounter (Signed)
 Reach out to Stephanie Hoffman about the refill. Stephanie Hoffman reports her coughing started again and need the Rx. She continued that she started her medication a day after her appt. Took it for a whole week. Was better the week after. But the week after that, her coughing had came back. Stephanie Hoffman states she scheduled an appt with her pulmology on 05/14/2024. She said she still has some inhaler left.   Stephanie Hoffman recalls her conversation with Dr. Charlett about medication she last used for when this happened. Stephanie Hoffman shares it was QVAR  38mcg/120m redihaler . She said to let Dr. Charlett know.    Please advise.

## 2024-04-15 ENCOUNTER — Other Ambulatory Visit (HOSPITAL_COMMUNITY): Payer: Self-pay

## 2024-04-15 ENCOUNTER — Other Ambulatory Visit: Payer: Self-pay | Admitting: Internal Medicine

## 2024-04-15 MED ORDER — QVAR REDIHALER 80 MCG/ACT IN AERB: 2.0000 | INHALATION_SPRAY | Freq: Two times a day (BID) | RESPIRATORY_TRACT | 1 refills | Status: DC

## 2024-04-15 NOTE — Telephone Encounter (Signed)
 Rx sent. Reach out to pt and update her on provider's advise. Pt is aware Rx is sent. Pt verbalized understanding.

## 2024-04-15 NOTE — Telephone Encounter (Signed)
 Ok to change to annuity take  1 inhalation daily  and tell patient about insurance coverage

## 2024-04-16 MED ORDER — ARNUITY ELLIPTA 200 MCG/ACT IN AEPB: 1.0000 | INHALATION_SPRAY | Freq: Every day | RESPIRATORY_TRACT | 0 refills | Status: AC

## 2024-04-16 NOTE — Telephone Encounter (Signed)
 Arnuity Ellipta  sent.   Follow up with pt and inform pt of insurance and new Rx sent. Pt reports before it wasn't covered either but states she is fine with what Dr. Charlett sent. No further action is needed.

## 2024-04-17 ENCOUNTER — Other Ambulatory Visit: Payer: Self-pay

## 2024-04-17 ENCOUNTER — Encounter: Payer: Self-pay | Admitting: Family Medicine

## 2024-04-17 ENCOUNTER — Ambulatory Visit (INDEPENDENT_AMBULATORY_CARE_PROVIDER_SITE_OTHER)

## 2024-04-17 ENCOUNTER — Ambulatory Visit: Admitting: Family Medicine

## 2024-04-17 VITALS — BP 122/72 | HR 72 | Ht 62.0 in | Wt 111.0 lb

## 2024-04-17 DIAGNOSIS — M25531 Pain in right wrist: Secondary | ICD-10-CM

## 2024-04-17 DIAGNOSIS — M79641 Pain in right hand: Secondary | ICD-10-CM

## 2024-04-17 DIAGNOSIS — M654 Radial styloid tenosynovitis [de Quervain]: Secondary | ICD-10-CM | POA: Diagnosis not present

## 2024-04-17 LAB — URIC ACID: Uric Acid, Serum: 3.6 mg/dL (ref 2.4–7.0)

## 2024-04-17 MED ORDER — TRAMADOL HCL 50 MG PO TABS: 50.0000 mg | ORAL_TABLET | Freq: Three times a day (TID) | ORAL | 0 refills | Status: AC | PRN

## 2024-04-17 NOTE — Progress Notes (Unsigned)
 I, Leotis Batter, CMA acting as a scribe for Artist Lloyd, MD.  Stephanie Hoffman is a 58 y.o. female who presents to [facility] today for R hand pain. Pt was previously seen by Dr. Lloyd on 07/11/23 for L shoulder pain.  Today, pt c/o R hand and wrist pain x 1.5 weeks. Pt locates pain to right hand and wrist. Throbbing pain in the morning. More localized pain at the base of the thumb. Pt is RHD.   Swelling: hand and wrist Grip strength: decreased Aggravates: gripping, grasping Treatments tried: heat, compression  Pertinent review of systems: No fevers or chills  Relevant historical information: Lumbar radiculopathy.   Exam:  BP 122/72   Pulse 72   Ht 5' 2 (1.575 m)   Wt 111 lb (50.3 kg)   LMP 02/12/2020   SpO2 98%   BMI 20.30 kg/m  General: Well Developed, well nourished, and in no acute distress.   MSK: Right wrist swelling at radial wrist.  Tender palpation radial styloid.  Positive Finkelstein's test.  Intact strength. Pulses capillary fill and sensation intact distally.    Lab and Radiology Results  Procedure: Real-time Ultrasound Guided Injection of right first dorsal wrist compartment tendon sheath (de Quervain injection) Device: Philips Affiniti 50G/GE Logiq Images permanently stored and available for review in PACS Verbal informed consent obtained.  Discussed risks and benefits of procedure. Warned about infection, bleeding, hyperglycemia damage to structures among others. Patient expresses understanding and agreement Time-out conducted.   Noted no overlying erythema, induration, or other signs of local infection.   Skin prepped in a sterile fashion.   Local anesthesia: Topical Ethyl chloride.   With sterile technique and under real time ultrasound guidance: 40 mg of Kenalog  and 1 mL of lidocaine injected into tendon sheath for the first dorsal wrist compartment. Fluid seen entering the tendon sheath.   Completed without  difficulty   Pain immediately resolved suggesting accurate placement of the medication.   Advised to call if fevers/chills, erythema, induration, drainage, or persistent bleeding.   Images permanently stored and available for review in the ultrasound unit.  Impression: Technically successful ultrasound guided injection.    X-ray images right hand obtained today personally and independently interpreted. Mild first CMC DJD.  No acute fractures. Await formal radiology review   Assessment and Plan: 58 y.o. female with right wrist pain due to de Quervain's tenosynovitis.  Plan for steroid injection today.  Patient had immediate significant benefit following injection indicating that was the source of pain.  She already has a thumb spica brace at home that she can use.  Relative rest for a little while and check back as needed.  Additionally will obtain x-ray and uric acid and have prescribed some limited tramadol .   PDMP reviewed during this encounter. Orders Placed This Encounter  Procedures   US  LIMITED JOINT SPACE STRUCTURES UP RIGHT(NO LINKED CHARGES)    Reason for Exam (SYMPTOM  OR DIAGNOSIS REQUIRED):   right hand and wrist pain    Preferred imaging location?:   [facility] Hand Complete Right    Standing Status:   Future    Number of Occurrences:   1    Expiration Date:   05/17/2024    Reason for Exam (SYMPTOM  OR DIAGNOSIS REQUIRED):   right hand pain    Preferred imaging location?:   [facility]    Is patient pregnant?:   No   Uric  acid    Standing Status:   Future    Number of Occurrences:   1    Expiration Date:   04/17/2025   Meds ordered this encounter  Medications   traMADol  (ULTRAM ) 50 MG tablet    Sig: Take 1 tablet (50 mg total) by mouth every 8 (eight) hours as needed for severe pain (pain score 7-10).    Dispense:  7 tablet    Refill:  0     Discussed warning signs or symptoms. Please see discharge instructions. Patient  expresses understanding.   The above documentation has been reviewed and is accurate and complete Artist Lloyd, M.D.

## 2024-04-17 NOTE — Patient Instructions (Signed)
 Thank you for coming in today.  You received an injection today. Seek immediate medical attention if the joint becomes red, extremely painful, or is oozing fluid.  Please get an Xray today before you leave

## 2024-04-18 ENCOUNTER — Ambulatory Visit: Admitting: Family Medicine

## 2024-04-18 ENCOUNTER — Ambulatory Visit: Payer: Self-pay | Admitting: Family Medicine

## 2024-04-18 NOTE — Progress Notes (Signed)
 Uric acid level is normal.  You do not have gout.

## 2024-04-25 NOTE — Progress Notes (Signed)
Right hand x-ray looks okay to radiology.

## 2024-05-02 ENCOUNTER — Ambulatory Visit
Admission: RE | Admit: 2024-05-02 | Discharge: 2024-05-02 | Disposition: A | Discharge status: Home / Self Care | Source: Ambulatory Visit

## 2024-05-02 VITALS — BP 119/74 | HR 76 | Temp 98.1°F | Resp 16

## 2024-05-02 DIAGNOSIS — R350 Frequency of micturition: Secondary | ICD-10-CM | POA: Diagnosis not present

## 2024-05-02 DIAGNOSIS — M2550 Pain in unspecified joint: Secondary | ICD-10-CM | POA: Insufficient documentation

## 2024-05-02 DIAGNOSIS — N3000 Acute cystitis without hematuria: Secondary | ICD-10-CM

## 2024-05-02 LAB — POCT URINE DIPSTICK
Bilirubin, UA: NEGATIVE
Glucose, UA: NEGATIVE mg/dL
Nitrite, UA: NEGATIVE
Protein Ur, POC: >=300 mg/dL — AB
Spec Grav, UA: >=1.03 — AB (ref 1.010–1.025)
Urobilinogen, UA: 0.2 U/dL
pH, UA: 6.5 (ref 5.0–8.0)

## 2024-05-02 MED ORDER — NITROFURANTOIN MONOHYD MACRO 100 MG PO CAPS: 100.0000 mg | ORAL_CAPSULE | Freq: Two times a day (BID) | ORAL | 0 refills | Status: AC

## 2024-05-02 NOTE — Discharge Instructions (Addendum)
 Urinalysis done today is consistent with a urinary tract infection.  Physical exam findings and symptoms are also consistent with a urinary tract infection.  We will treat this with antibiotics by mouth.  We will treat with the following: Macrobid 100 mg twice daily for 5 days. This is an antibiotic. Make sure to stay hydrated by drinking plenty of water. Return to urgent care or PCP if symptoms worsen or fail to resolve.

## 2024-05-02 NOTE — ED Triage Notes (Signed)
 Pt reports dysuria, urinary frequency, urgency, and foul odor x 5-6 days. Pt reports pink-tinged urine last night. Denies abdominal and flank pain. No med use for symptoms.

## 2024-05-02 NOTE — ED Provider Notes (Signed)
 [facility]    CSN: [id number] Arrival date & time: 05/02/24  1402      History   Chief Complaint Chief Complaint  Patient presents with   Urinary Frequency    Entered by patient   Dysuria    HPI Stephanie Hoffman is a 58 y.o. female.   58 year old female presents urgent care with complaints of dysuria, hematuria, urinary frequency, urinary urgency as well as malodorous urine.  She reports that this started last week but has gotten much worse in the last 24 hours.  She denies any nausea, vomiting, fevers, chills, diarrhea or abdominal pain.   Urinary Frequency Pertinent negatives include no chest pain, no abdominal pain and no shortness of breath.  Dysuria Associated symptoms: no abdominal pain, no fever and no vomiting     Past Medical History:  Diagnosis Date   Acute right-sided low back pain with right-sided sciatica 12/26/2018   Angio-edema    Gallstones    removed 2000   Headache(784.0)    Heart murmur    Hyperlipidemia    Pneumonia    Recurrent upper respiratory infection (URI)    Urticaria    Varicose veins     Patient Active Problem List   Diagnosis Date Noted   Polyarthralgia 05/02/2024   Pain in right hand 01/10/2023   Subacromial bursitis of right shoulder joint 02/08/2022   Sacroiliac joint dysfunction of left side 05/09/2021   Lipoma of forearm 04/07/2021   Lumbar radiculopathy 04/07/2021   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  11/17/2013   Neck pain on right side 11/17/2013   Family hx of colon cancer 07/12/2012   ARTHRITIS, LEFT SHOULDER 04/13/2010   BREAST MASS, LEFT 02/04/2010   HEADACHE 05/02/2007    Past  Surgical History:  Procedure Laterality Date   CESAREAN SECTION     CHOLECYSTECTOMY N/A 2007   COLONOSCOPY  01/27/2015   Dr.Gessner   KNEE SURGERY     left torn ligament 2007   SHOULDER SURGERY     right and left   WISDOM TOOTH EXTRACTION Bilateral 1997    OB History   No obstetric history on file.      Home Medications    Prior to Admission medications   Medication Sig Start Date End Date Taking? Authorizing Provider  albuterol  (VENTOLIN  HFA) 108 (90 Base) MCG/ACT inhaler TAKE 2 PUFFS EVERY 6 HOURS AS NEEDED FOR WHEEZE OR FOR SHORTNESS OF BREATH OR COUGH 04/14/24  Yes Panosh, Wanda K, MD  Cholecalciferol (VITAMIN D3 PO) Take by mouth.   Yes [provider]  Cyanocobalamin (VITAMIN B-12 PO) Take by mouth.   Yes [provider]  diphenhydramine-acetaminophen (TYLENOL PM) 25-500 MG TABS Take 1 tablet by mouth at bedtime as needed (sleep).    Yes [provider]  Fluticasone Furoate  (ARNUITY ELLIPTA ) 200 MCG/ACT AEPB Inhale 1 Inhalation into the lungs daily. 04/16/24  Yes Panosh, Wanda K, MD  Menaquinone-7 (VITAMIN K2 PO) Take by mouth.   Yes [provider]  Misc Natural Products (BEET ROOT PO) Take by mouth daily.   Yes [provider]  Multiple Vitamins-Minerals (WOMENS MULTIVITAMIN PO) Take by mouth.   Yes [provider]  Potassium 99 MG TABS Take 99 mg by mouth daily.   Yes [provider]  predniSONE  (DELTASONE ) 50 MG tablet Take 1 tablet (50 mg total) by mouth daily. 03/13/24  Yes Panosh, Wanda K, MD  Turmeric (QC TUMERIC COMPLEX PO) Take by mouth.   Yes [provider]  naproxen  (EC NAPROSYN ) 500 MG EC tablet 1 tablet Orally every 12 hrs as needed; Duration: 30 day(s) Patient not taking: Reported on 05/02/2024 12/30/14   [provider]  nitrofurantoin, macrocrystal-monohydrate, (MACROBID) 100 MG capsule Take 1 capsule (100 mg total) by mouth 2 (two) times daily. 05/02/24  Yes Tenna Lacko, Almarie LABOR, PA-C   NON FORMULARY Heal and Joint supplement Patient not taking: Reported on 05/02/2024    [provider]  traMADol  (ULTRAM ) 50 MG tablet Take 1 tablet (50 mg total) by mouth every 8 (eight) hours as needed for severe pain (pain score 7-10). Patient not taking: Reported on 05/02/2024 04/17/24   Joane Artist RAMAN, MD    Family History Family History  Problem Relation Age of Onset   Arthritis Mother    Diabetes Mother    Hyperlipidemia Mother    Heart disease Mother    Colon cancer Mother 52   Hypertension Father    Prostate cancer Maternal Grandfather 37   Stroke Paternal Grandfather    Stomach cancer Maternal Grandmother 26   Esophageal cancer Neg Hx    Rectal cancer Neg Hx    Colon polyps Neg Hx     Social History Social History   Tobacco Use   Smoking status: Never   Smokeless tobacco: Never  Vaping Use   Vaping status: Never Used  Substance Use Topics   Alcohol use: No   Drug use: No     Allergies   Amoxicillin, Codeine, Tramadol , Vitamin e, Coconut (cocos nucifera), Magnesium-containing compounds, Penicillins, Vitamin e-safflower oil, Other, and Penicillin g   Review of Systems Review of Systems  Constitutional:  Negative for chills and fever.  HENT:  Negative for ear pain and sore throat.   Eyes:  Negative for pain and visual disturbance.  Respiratory:  Negative for cough and shortness of breath.   Cardiovascular:  Negative for chest pain and palpitations.  Gastrointestinal:  Negative for abdominal pain and vomiting.  Genitourinary:  Positive for dysuria, frequency and urgency. Negative for hematuria.       Malodorous urine  Musculoskeletal:  Negative for arthralgias and back pain.  Skin:  Negative for color change and rash.  Neurological:  Negative for seizures and syncope.  All other systems reviewed and are negative.    Physical Exam Triage Vital Signs ED Triage Vitals  Encounter Vitals Group     BP 05/02/24 1419 119/74     Girls Systolic BP  Percentile --      Girls Diastolic BP Percentile --      Boys Systolic BP Percentile --      Boys Diastolic BP Percentile --      Pulse Rate 05/02/24 1419 76     Resp 05/02/24 1419 16     Temp 05/02/24 1419 98.1 F (36.7 C)     Temp Source 05/02/24 1419 Oral     SpO2 05/02/24 1419 97 %     Weight --      Height --      Head Circumference --      Peak Flow --      Pain Score 05/02/24 1420 0     Pain  Loc --      Pain Education --      Exclude from Growth Chart --    No data found.  Updated Vital Signs BP 119/74 (BP Location: Left Arm)   Pulse 76   Temp 98.1 F (36.7 C) (Oral)   Resp 16   LMP 02/12/2020   SpO2 97%   Visual Acuity Right Eye Distance:   Left Eye Distance:   Bilateral Distance:    Right Eye Near:   Left Eye Near:    Bilateral Near:     Physical Exam Vitals and nursing note reviewed.  Constitutional:      General: She is not in acute distress.    Appearance: She is well-developed.  HENT:     Head: Normocephalic and atraumatic.  Eyes:     Conjunctiva/sclera: Conjunctivae normal.  Cardiovascular:     Rate and Rhythm: Normal rate and regular rhythm.     Heart sounds: No murmur heard. Pulmonary:     Effort: Pulmonary effort is normal. No respiratory distress.     Breath sounds: Normal breath sounds.  Abdominal:     Palpations: Abdomen is soft.     Tenderness: There is no abdominal tenderness.  Musculoskeletal:        General: No swelling.     Cervical back: Neck supple.  Skin:    General: Skin is warm and dry.     Capillary Refill: Capillary refill takes less than 2 seconds.  Neurological:     Mental Status: She is alert.  Psychiatric:        Mood and Affect: Mood normal.      UC Treatments / Results  Labs (all labs ordered are listed, but only abnormal results are displayed) Labs Reviewed  POCT URINE DIPSTICK - Abnormal; Notable for the following components:      Result Value   Color, UA straw (*)    Clarity, UA cloudy (*)     Ketones, POC UA trace (5) (*)    Spec Grav, UA >=1.030 (*)    Blood, UA moderate (*)    Protein Ur, POC >=300 (*)    Leukocytes, UA Trace (*)    All other components within normal limits    EKG   Radiology No results found.  Procedures Procedures (including critical care time)  Medications Ordered in UC Medications - No data to display  Initial Impression / Assessment and Plan / UC Course  I have reviewed the triage vital signs and the nursing notes.  Pertinent labs & imaging results that were available during my care of the patient were reviewed by me and considered in my medical decision making (see chart for details).     Urinary frequency  Acute cystitis without hematuria   Urinalysis done today is consistent with a urinary tract infection.  Physical exam findings and symptoms are also consistent with a urinary tract infection.  We will treat this with antibiotics by mouth.  We will treat with the following: Macrobid 100 mg twice daily for 5 days. This is an antibiotic. Make sure to stay hydrated by drinking plenty of water. Return to urgent care or PCP if symptoms worsen or fail to resolve.  Final Clinical Impressions(s) / UC Diagnoses   Final diagnoses:  Urinary frequency  Acute cystitis without hematuria     Discharge Instructions      Urinalysis done today is consistent with a urinary tract infection.  Physical exam findings and symptoms are also consistent with a  urinary tract infection.  We will treat this with antibiotics by mouth.  We will treat with the following: Macrobid 100 mg twice daily for 5 days. This is an antibiotic. Make sure to stay hydrated by drinking plenty of water. Return to urgent care or PCP if symptoms worsen or fail to resolve.      ED Prescriptions     Medication Sig Dispense Auth. Provider   nitrofurantoin, macrocrystal-monohydrate, (MACROBID) 100 MG capsule Take 1 capsule (100 mg total) by mouth 2 (two) times daily. 10  capsule Teresa Almarie LABOR, [place]      PDMP not reviewed this encounter.   Teresa Almarie LABOR, [place] 05/02/24 1454

## 2024-05-10 ENCOUNTER — Ambulatory Visit
Admission: EM | Admit: 2024-05-10 | Discharge: 2024-05-10 | Disposition: A | Discharge status: Home / Self Care | Attending: Physician Assistant | Admitting: Physician Assistant

## 2024-05-10 ENCOUNTER — Encounter: Payer: Self-pay | Admitting: Emergency Medicine

## 2024-05-10 DIAGNOSIS — J069 Acute upper respiratory infection, unspecified: Secondary | ICD-10-CM | POA: Diagnosis not present

## 2024-05-10 MED ORDER — BENZONATATE 200 MG PO CAPS: 200.0000 mg | ORAL_CAPSULE | Freq: Three times a day (TID) | ORAL | 0 refills | Status: AC | PRN

## 2024-05-10 NOTE — ED Provider Notes (Signed)
 [facility]    CSN: [id number] Arrival date & time: 05/10/24  1409      History   Chief Complaint Chief Complaint  Patient presents with   Cough    HPI Stephanie Hoffman is a 58 y.o. female.   Patient here consistent with cough x 4 days.  She reports congestion, rhinorrhea.  She states cough is productive.  She has appointment pulmonology in 4 days.  She has not taken any medications for this today.    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  11/17/2013   Neck pain on right side 11/17/2013   Family hx of colon cancer 07/12/2012   ARTHRITIS, LEFT SHOULDER 04/13/2010   BREAST MASS, LEFT 02/04/2010   HEADACHE 05/02/2007    Past Surgical History:  Procedure Laterality Date   CESAREAN SECTION     CHOLECYSTECTOMY N/A 2007   COLONOSCOPY  01/27/2015   Dr.Gessner   KNEE SURGERY     left torn ligament 2007   SHOULDER SURGERY     right and left   WISDOM TOOTH EXTRACTION Bilateral 1997    OB History   No obstetric history on file.      Home  Medications    Prior to Admission medications   Medication Sig Start Date End Date Taking? Authorizing Provider  benzonatate  (TESSALON ) 200 MG capsule Take 1 capsule (200 mg total) by mouth 3 (three) times daily as needed for cough. 05/10/24  Yes Juleen Rush, PA-C  albuterol  (VENTOLIN  HFA) 108 (90 Base) MCG/ACT inhaler TAKE 2 PUFFS EVERY 6 HOURS AS NEEDED FOR WHEEZE OR FOR SHORTNESS OF BREATH OR COUGH 04/14/24   Panosh, Wanda K, MD  Cholecalciferol (VITAMIN D3 PO) Take by mouth.    [provider]  Cyanocobalamin (VITAMIN B-12 PO) Take by mouth.    [provider]  diphenhydramine-acetaminophen (TYLENOL PM) 25-500 MG TABS Take 1 tablet by mouth at bedtime as needed (sleep).     [provider]  Fluticasone Furoate  (ARNUITY ELLIPTA ) 200 MCG/ACT AEPB Inhale 1 Inhalation into the lungs daily. 04/16/24   Panosh, Wanda K, MD  Menaquinone-7 (VITAMIN K2 PO) Take by mouth.    [provider]  Misc Natural Products (BEET ROOT PO) Take by mouth daily.    [provider]  Multiple Vitamins-Minerals (WOMENS MULTIVITAMIN PO) Take by mouth.    [provider]  naproxen  (EC NAPROSYN ) 500 MG EC tablet 1 tablet Orally every 12 hrs as needed; Duration: 30 day(s) Patient not taking: Reported  on 05/02/2024 12/30/14   [provider]  nitrofurantoin, macrocrystal-monohydrate, (MACROBID) 100 MG capsule Take 1 capsule (100 mg total) by mouth 2 (two) times daily. 05/02/24   White, Almarie LABOR, PA-C  NON FORMULARY Heal and Joint supplement Patient not taking: Reported on 05/02/2024    [provider]  Potassium 99 MG TABS Take 99 mg by mouth daily.    [provider]  predniSONE  (DELTASONE ) 50 MG tablet Take 1 tablet (50 mg total) by mouth daily. 03/13/24   Panosh, Wanda K, MD  traMADol  (ULTRAM ) 50 MG tablet Take 1 tablet (50 mg total) by mouth every 8 (eight) hours as needed for severe pain (pain score 7-10). Patient not taking: Reported on  05/02/2024 04/17/24   Corey, Evan S, MD  Turmeric (QC TUMERIC COMPLEX PO) Take by mouth.    [provider]    Family History Family History  Problem Relation Age of Onset   Arthritis Mother    Diabetes Mother    Hyperlipidemia Mother    Heart disease Mother    Colon cancer Mother 66   Hypertension Father    Prostate cancer Maternal Grandfather 36   Stroke Paternal Grandfather    Stomach cancer Maternal Grandmother 49   Esophageal cancer Neg Hx    Rectal cancer Neg Hx    Colon polyps Neg Hx     Social History Social History   Tobacco Use   Smoking status: Never   Smokeless tobacco: Never  Vaping Use   Vaping status: Never Used  Substance Use Topics   Alcohol use: No   Drug use: No     Allergies   Amoxicillin, Codeine, Tramadol , Vitamin e, Coconut (cocos nucifera), Magnesium-containing compounds, Penicillins, Vitamin e-safflower oil, Other, and Penicillin g   Review of Systems Review of Systems  Constitutional:  Negative for chills, fatigue and fever.  HENT:  Positive for congestion and rhinorrhea. Negative for ear pain, nosebleeds, postnasal drip, sinus pressure, sinus pain and sore throat.   Eyes:  Negative for pain and redness.  Respiratory:  Positive for cough. Negative for shortness of breath and wheezing.   Gastrointestinal:  Negative for abdominal pain, diarrhea, nausea and vomiting.  Musculoskeletal:  Negative for arthralgias and myalgias.  Skin:  Negative for rash.  Neurological:  Negative for light-headedness and headaches.  Hematological:  Negative for adenopathy. Does not bruise/bleed easily.  Psychiatric/Behavioral:  Negative for confusion and sleep disturbance.      Physical Exam Triage Vital Signs ED Triage Vitals  Encounter Vitals Group     BP 05/10/24 1452 117/74     Girls Systolic BP Percentile --      Girls Diastolic BP Percentile --      Boys Systolic BP Percentile --      Boys Diastolic BP Percentile --      Pulse Rate 05/10/24  1452 92     Resp 05/10/24 1452 16     Temp 05/10/24 1452 98.5 F (36.9 C)     Temp Source 05/10/24 1452 Oral     SpO2 05/10/24 1452 96 %     Weight 05/10/24 1453 105 lb (47.6 kg)     Height 05/10/24 1453 5' 1.5 (1.562 m)     Head Circumference --      Peak Flow --      Pain Score 05/10/24 1452 0     Pain Loc --      Pain Education --      Exclude from Growth Chart --  No data found.  Updated Vital Signs BP 117/74 (BP Location: Left Arm)   Pulse 92   Temp 98.5 F (36.9 C) (Oral)   Resp 16   Ht 5' 1.5 (1.562 m)   Wt 105 lb (47.6 kg)   LMP 02/12/2020   SpO2 96%   BMI 19.52 kg/m   Visual Acuity Right Eye Distance:   Left Eye Distance:   Bilateral Distance:    Right Eye Near:   Left Eye Near:    Bilateral Near:     Physical Exam Vitals and nursing note reviewed.  Constitutional:      General: She is not in acute distress.    Appearance: Normal appearance. She is not ill-appearing.  HENT:     Head: Normocephalic and atraumatic.     Right Ear: Tympanic membrane and ear canal normal.     Left Ear: Tympanic membrane and ear canal normal.     Nose: No congestion or rhinorrhea.     Mouth/Throat:     Pharynx: No oropharyngeal exudate or posterior oropharyngeal erythema.  Eyes:     General: No scleral icterus.    Extraocular Movements: Extraocular movements intact.     Conjunctiva/sclera: Conjunctivae normal.  Cardiovascular:     Rate and Rhythm: Normal rate and regular rhythm.     Heart sounds: No murmur heard. Pulmonary:     Effort: Pulmonary effort is normal. No respiratory distress.     Breath sounds: Normal breath sounds. No wheezing or rales.  Musculoskeletal:     Cervical back: Normal range of motion. No rigidity.  Lymphadenopathy:     Cervical: No cervical adenopathy.  Skin:    Coloration: Skin is not jaundiced.     Findings: No rash.  Neurological:     General: No focal deficit present.     Mental Status: She is alert and oriented to person,  place, and time.     Motor: No weakness.     Gait: Gait normal.  Psychiatric:        Mood and Affect: Mood normal.        Behavior: Behavior normal.      UC Treatments / Results  Labs (all labs ordered are listed, but only abnormal results are displayed) Labs Reviewed - No data to display  EKG   Radiology No results found.  Procedures Procedures (including critical care time)  Medications Ordered in UC Medications - No data to display  Initial Impression / Assessment and Plan / UC Course  I have reviewed the triage vital signs and the nursing notes.  Pertinent labs & imaging results that were available during my care of the patient were reviewed by me and considered in my medical decision making (see chart for details).     Symptoms consistent with viral upper respiratory infection. Follow-up with pulmonologist. Final Clinical Impressions(s) / UC Diagnoses   Final diagnoses:  Viral URI     Discharge Instructions      Take medication as needed. Follow-up with pulmonologist next week.   ED Prescriptions     Medication Sig Dispense Auth. Provider   benzonatate  (TESSALON ) 200 MG capsule Take 1 capsule (200 mg total) by mouth 3 (three) times daily as needed for cough. 20 capsule Juleen Rush, PA-C      PDMP not reviewed this encounter.   Juleen Rush, PA-C 05/10/24 1518

## 2024-05-10 NOTE — ED Triage Notes (Signed)
 Pt c/o productive cough x's 5 days  St's she has been using OTC meds without relief

## 2024-05-10 NOTE — Discharge Instructions (Signed)
 Take medication as needed. Follow-up with pulmonologist next week.

## 2024-05-14 ENCOUNTER — Encounter: Payer: Self-pay | Admitting: Internal Medicine

## 2024-05-14 ENCOUNTER — Ambulatory Visit: Admitting: Internal Medicine

## 2024-05-14 VITALS — BP 132/70 | HR 74 | Temp 98.8°F | Ht 61.0 in | Wt 110.0 lb

## 2024-05-14 DIAGNOSIS — J069 Acute upper respiratory infection, unspecified: Secondary | ICD-10-CM | POA: Diagnosis not present

## 2024-05-14 DIAGNOSIS — R059 Cough, unspecified: Secondary | ICD-10-CM

## 2024-05-14 DIAGNOSIS — R053 Chronic cough: Secondary | ICD-10-CM | POA: Diagnosis not present

## 2024-05-14 DIAGNOSIS — R0982 Postnasal drip: Secondary | ICD-10-CM

## 2024-05-14 LAB — POCT EXHALED NITRIC OXIDE: FeNO level (ppb): 12

## 2024-05-14 MED ORDER — AZELASTINE HCL 0.1 % NA SOLN: 1.0000 | Freq: Two times a day (BID) | NASAL | 12 refills | Status: AC

## 2024-05-14 NOTE — Patient Instructions (Addendum)
 It was a pleasure to see you today!  Please schedule follow up as needed. You can also send us  a message through MyChart, but but aware that this is not to be used for urgent issues and it may take up to 5-7 days to receive a reply. Please be aware that you will likely be able to view your results before I have a chance to respond to them. Please give us  5 business days to respond to any non-urgent results.     -CHRONIC COUGH: A chronic cough is a cough that lasts for an extended period, often more than eight weeks.  -POSTNASAL DRIP (UPPER AIRWAY COUGH SYNDROME) WITH ENVIRONMENTAL ALLERGIES: Postnasal drip occurs when excess mucus from the nose drips down the back of the throat, often causing a cough. Your environmental allergies and recent cold may have worsened this condition. We will address your nasal drainage with astelin nasal spray. Continue the saline lavage beforehand.   -RECENT UPPER RESPIRATORY INFECTION: A recent upper respiratory infection, such as a cold, can increase mucus production and worsen a chronic cough. Your recent cold likely contributed to your increased symptoms.  Astelin - 1 spray on each side of your nose twice a day for first week, then 1 spray on each side.   Instructions for use: If you also use a saline nasal spray or rinse, use that first. Position the head with the chin slightly tucked. Use the right hand to spray into the left nostril and the right hand to spray into the left nostril.   Point the bottle away from the septum of your nose (cartilage that divides the two sides of your nose).  Hold the nostril closed on the opposite side from where you will spray Spray once and gently sniff to pull the medicine into the higher parts of your nose.  Don't sniff too hard as the medicine will drain down the back of your throat instead. Repeat with a second spray on the same side if prescribed. Repeat on the other side of your nose.

## 2024-05-14 NOTE — Progress Notes (Signed)
 Stephanie Hoffman    [id number]    10-20-1965  Primary Care Physician:Panosh, Apolinar POUR, MD  Referring Physician: Charlett Apolinar POUR, MD [address] [place],  [place] [address] Reason for Consultation: chronic cough Date of Consultation: 05/14/2024  Chief complaint:   Chief Complaint  Patient presents with   Consult   Cough    Constant productive cough. Started in march Patient states medication prescribed not working. No history of Asthma     HPI: Discussed the use of AI scribe software for clinical note transcription with the patient, who gave verbal consent to proceed.  History of Present Illness Stephanie Hoffman is a 58 year old female who presents with a chronic cough.  She has experienced a persistent cough since March, initially thought to be due to allergies from the heavy pollen season. She tried Allegra for a month without relief, then switched to Claritin, also without improvement. Due to difficulty breathing during coughing episodes, she was prescribed albuterol , which she has been using for approximately three months. However, a month ago, the albuterol  was not effective, and she was prescribed Arnuity, which she has been using once daily and has three days remaining on her current course.  Two weeks ago, she contracted a cold from her husband, which exacerbated her symptoms. She confirmed it was not COVID-19 through testing. Due to increased coughing, she visited urgent care and was prescribed benzonatate , which she is completing tomorrow. Despite these treatments, she continues to experience a cough, which is now productive due to the cold. Initially, the cough was dry with a tickle in the throat. She uses steam inhalation and nasal saline lavage, which provide some relief, but she still wakes up at night coughing and experiences coughing throughout the Hoffman regardless of her activity or position.  She reports only a little shortness of breath when  coughing, but otherwise denies chest tightness or wheezing. She can perform activities such as walking and climbing stairs. She has a history of environmental allergies, particularly when exposed to yard work, but no significant history of lung disease in her family. Her son has sports asthma, and her mother-in-law has lung issues, though not blood-related. 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  She has tried various over-the-counter treatments, including Flonase and Mucinex, without success. She denies symptoms of reflux or heartburn. Her social history includes exposure to secondhand smoke during childhood as both parents smoked, but she has never smoked herself.  Social history:  Occupation: works from home, office job Smoking history: never smoker. Passive smoke exposure in childhood  Social History   Occupational History   Not on file  Tobacco Use   Smoking status: Never   Smokeless tobacco: Never  Vaping Use   Vaping status: Never Used  Substance and Sexual Activity   Alcohol use: No   Drug use: No   Sexual activity: Yes    Comment: husband had vastectomy    Relevant family history:  Family History  Problem Relation Age of Onset   Arthritis Mother    Diabetes Mother    Hyperlipidemia Mother    Heart disease Mother    Colon cancer Mother 60   Hypertension Father    Stomach cancer Maternal Grandmother 34   Prostate cancer Maternal Grandfather 27   Stroke Paternal Grandfather    Asthma Son    Esophageal cancer Neg Hx  Rectal cancer Neg Hx    Colon polyps Neg Hx    Lung disease Neg Hx     Past Medical History:  Diagnosis Date   Acute right-sided low back pain with right-sided sciatica 12/26/2018   Angio-edema    Gallstones    removed 2000   Headache(784.0)    Heart murmur    Hyperlipidemia    Pneumonia    Recurrent upper respiratory infection (URI)    Urticaria    Varicose veins      Past Surgical History:  Procedure Laterality Date   CESAREAN SECTION     CHOLECYSTECTOMY N/A 2007   COLONOSCOPY  01/27/2015   Dr.Gessner   KNEE SURGERY     left torn ligament 2007   SHOULDER SURGERY     right and left   WISDOM TOOTH EXTRACTION Bilateral 1997     Physical Exam: Blood pressure 132/70, pulse 74, temperature 98.8 F (37.1 C), temperature source Oral, height 5' 1 (1.549 m), weight 110 lb (49.9 kg), last menstrual period 02/12/2020, SpO2 98%. Gen:      No acute distress ENT:  +cobblestoning, no nasal polyps, mucus membranes moist Lungs:    No increased respiratory effort, symmetric chest wall excursion, clear to auscultation bilaterally, no wheezes or crackles CV:         Regular rate and rhythm; no murmurs, rubs, or gallops.  No pedal edema Abd:      + bowel sounds; soft, non-tender; no distension MSK: no acute synovitis of DIP or PIP joints, no mechanics hands.  Skin:      Warm and dry; no rashes Neuro: normal speech, no focal facial asymmetry Psych: alert and oriented x3, normal mood and affect   Data Reviewed/Medical Decision Making:  Independent interpretation of tests: Imaging:  Labs:  Lab Results  Component Value Date   NA 140 11/15/2023   K 4.2 11/15/2023   CO2 30 11/15/2023   GLUCOSE 99 11/15/2023   BUN 22 11/15/2023   CREATININE 0.85 11/15/2023   CALCIUM 10.0 11/15/2023   GFR 75.81 11/15/2023   GFRNONAA >60 09/10/2016   Lab Results  Component Value Date   WBC 4.8 11/15/2023   HGB 14.2 11/15/2023   HCT 41.7 11/15/2023   MCV 87.5 11/15/2023   PLT 279.0 11/15/2023     Immunization status:  Immunization History  Administered Date(s) Administered   Influenza Inj Mdck Quad Pf 07/19/2015, 06/29/2021   Influenza Split 07/10/2012, 06/02/2013   Influenza Whole 04/13/2010   Influenza, Quadrivalent, Recombinant, Inj, Pf 08/08/2017   Influenza, Seasonal, Injecte, Preservative Fre 07/19/2015   Influenza,inj,Quad PF,6+ Mos 07/19/2015,  07/25/2016, 08/08/2017, 08/19/2018, 08/22/2019, 08/25/2020   Influenza,inj,quad, With Preservative 07/19/2015   Influenza-Unspecified 07/19/2015, 06/29/2021   PFIZER(Purple Top)SARS-COV-2 Vaccination 10/11/2019, 10/29/2019, 08/07/2020   Td 08/01/1991, 08/01/2002   Tdap 09/24/2013, 11/15/2023   Zoster Recombinant(Shingrix) 06/29/2021, 10/07/2021, 11/26/2021     I reviewed prior external note(s) from primary care  I reviewed the result(s) of the labs and imaging as noted above.   I have ordered feno  Assessment and Plan Assessment & Plan Chronic cough Postnasal drip (upper airway cough syndrome) with environmental allergies Postnasal drip suspected as a factor in chronic cough. Environmental allergies present. Recent cold likely increased nasal drainage. Chronic cough since March, unresponsive to antihistamines, nasal sprays, and inhalers. Clear lung exam suggests non-pulmonary origin. Differential includes postnasal drip and reflux, with reflux less likely. - Perform Feno - 12 ppb  not consistent with allergic inflammation  Return to Care: Return if symptoms worsen or fail to improve.  Verdon Gore, MD Pulmonary and Critical Care Medicine  HealthCare [phone]  CC: Panosh, Wanda K, MD

## 2024-06-03 ENCOUNTER — Ambulatory Visit
Admission: EM | Admit: 2024-06-03 | Discharge: 2024-06-03 | Disposition: A | Discharge status: Home / Self Care | Attending: Family Medicine | Admitting: Family Medicine

## 2024-06-03 ENCOUNTER — Ambulatory Visit (INDEPENDENT_AMBULATORY_CARE_PROVIDER_SITE_OTHER)

## 2024-06-03 ENCOUNTER — Encounter: Payer: Self-pay | Admitting: Emergency Medicine

## 2024-06-03 ENCOUNTER — Other Ambulatory Visit: Payer: Self-pay

## 2024-06-03 DIAGNOSIS — R319 Hematuria, unspecified: Secondary | ICD-10-CM | POA: Diagnosis present

## 2024-06-03 DIAGNOSIS — N390 Urinary tract infection, site not specified: Secondary | ICD-10-CM | POA: Diagnosis not present

## 2024-06-03 DIAGNOSIS — R053 Chronic cough: Secondary | ICD-10-CM | POA: Diagnosis present

## 2024-06-03 DIAGNOSIS — R350 Frequency of micturition: Secondary | ICD-10-CM | POA: Insufficient documentation

## 2024-06-03 LAB — POCT URINE DIPSTICK
Bilirubin, UA: NEGATIVE
Glucose, UA: NEGATIVE mg/dL
Ketones, POC UA: NEGATIVE mg/dL
Nitrite, UA: NEGATIVE
POC PROTEIN,UA: NEGATIVE
Spec Grav, UA: <=1.005 — AB (ref 1.010–1.025)
Urobilinogen, UA: 0.2 U/dL
pH, UA: 7 (ref 5.0–8.0)

## 2024-06-03 MED ORDER — SULFAMETHOXAZOLE-TRIMETHOPRIM 800-160 MG PO TABS
1.0000 | ORAL_TABLET | Freq: Two times a day (BID) | ORAL | 0 refills | Status: AC
Start: 2024-06-03 — End: 2024-06-08

## 2024-06-03 NOTE — Discharge Instructions (Addendum)
 You were seen today for various issues.  Your urine does show infection.  I am treating you with an oral antibiotic for this.  I will also send your urine to the lab for culture.  If a change in medication is needed we will notify you.  Your chest xray appears normal.  If the radiologist reads this differently we will notify you.  You should follow up with your primary care provider and pulmonologist for further care for this chronic issue.

## 2024-06-03 NOTE — ED Triage Notes (Signed)
 Pt here for cough x 1 month and UTI sx with frequency and possible hematuria

## 2024-06-03 NOTE — ED Provider Notes (Signed)
 [facility]    CSN: [id number] Arrival date & time: 06/03/24  1502      History   Chief Complaint Chief Complaint  Patient presents with   Dysuria   Cough    HPI Stephanie Hoffman is a 58 y.o. female.    Dysuria Cough  Patient is here for several issues.  She has had urinary frequency x 2 days, and maybe some blood in her urine.  No abdominal pain or back pain.   Also with a cough x 8 months. She has been seen here and by her pcp.  She has also seen the pulmonologist.   Given a nasal spray, nasal cleanser.  She does have wheezing with the cough, and uses albuterol  with help.        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  11/17/2013   Neck pain on right side 11/17/2013   Family hx of colon cancer 07/12/2012   ARTHRITIS, LEFT SHOULDER 04/13/2010   BREAST MASS, LEFT 02/04/2010   HEADACHE 05/02/2007    Past Surgical History:  Procedure Laterality Date   CESAREAN SECTION     CHOLECYSTECTOMY N/A 2007   COLONOSCOPY  01/27/2015   Dr.Gessner    KNEE SURGERY     left torn ligament 2007   SHOULDER SURGERY     right and left   WISDOM TOOTH EXTRACTION Bilateral 1997    OB History   No obstetric history on file.      Home Medications    Prior to Admission medications   Medication Sig Start Date End Date Taking? Authorizing Provider  albuterol  (VENTOLIN  HFA) 108 (90 Base) MCG/ACT inhaler TAKE 2 PUFFS EVERY 6 HOURS AS NEEDED FOR WHEEZE OR FOR SHORTNESS OF BREATH OR COUGH 04/14/24   Panosh, Wanda K, MD  azelastine (ASTELIN) 0.1 % nasal spray Place 1 spray into both nostrils 2 (two) times daily. Use in each nostril as directed 05/14/24   Meade Verdon RAMAN, MD  benzonatate  (TESSALON ) 200 MG capsule Take 1 capsule (200 mg total) by mouth 3 (three) times daily as needed for cough. 05/10/24   Juleen Rush, PA-C  Cholecalciferol (VITAMIN D3 PO) Take by mouth.    [provider]  Cyanocobalamin (VITAMIN B-12 PO) Take by mouth.    [provider]  diphenhydramine-acetaminophen (TYLENOL PM) 25-500 MG TABS Take 1 tablet by mouth at bedtime as needed (sleep).     [provider]  Fluticasone Furoate  (ARNUITY ELLIPTA ) 200 MCG/ACT AEPB Inhale 1 Inhalation into  the lungs daily. 04/16/24   Panosh, Wanda K, MD  Menaquinone-7 (VITAMIN K2 PO) Take by mouth.    [provider]  Misc Natural Products (BEET ROOT PO) Take by mouth daily.    [provider]  Multiple Vitamins-Minerals (WOMENS MULTIVITAMIN PO) Take by mouth.    [provider]  naproxen  (EC NAPROSYN ) 500 MG EC tablet 1 tablet Orally every 12 hrs as needed; Duration: 30 day(s) Patient not taking: Reported on 05/14/2024 12/30/14   [provider]  nitrofurantoin, macrocrystal-monohydrate, (MACROBID) 100 MG capsule Take 1 capsule (100 mg total) by mouth 2 (two) times daily. Patient not taking: Reported on 05/14/2024 05/02/24   Teresa Almarie LABOR, PA-C  NON FORMULARY [facility] supplement Patient not taking: Reported on 05/14/2024     [provider]  Potassium 99 MG TABS Take 99 mg by mouth daily.    [provider]  predniSONE  (DELTASONE ) 50 MG tablet Take 1 tablet (50 mg total) by mouth daily. Patient not taking: Reported on 05/14/2024 03/13/24   Panosh, Apolinar POUR, MD  traMADol  (ULTRAM ) 50 MG tablet Take 1 tablet (50 mg total) by mouth every 8 (eight) hours as needed for severe pain (pain score 7-10). Patient not taking: Reported on 05/14/2024 04/17/24   Corey, Evan S, MD  Turmeric (QC TUMERIC COMPLEX PO) Take by mouth.    [provider]    Family History Family History  Problem Relation Age of Onset   Arthritis Mother    Diabetes Mother    Hyperlipidemia Mother    Heart disease Mother    Colon cancer Mother 11   Hypertension Father    Stomach cancer Maternal Grandmother 39   Prostate cancer Maternal Grandfather 8   Stroke Paternal Grandfather    Asthma Son    Esophageal cancer Neg Hx    Rectal cancer Neg Hx    Colon polyps Neg Hx    Lung disease Neg Hx     Social History Social History   Tobacco Use   Smoking status: Never   Smokeless tobacco: Never  Vaping Use   Vaping status: Never Used  Substance Use Topics   Alcohol use: No   Drug use: No     Allergies   Amoxicillin, Codeine, Tramadol , Vitamin e, Coconut (cocos nucifera), Magnesium-containing compounds, Penicillins, Vitamin e-safflower oil, Other, and Penicillin g   Review of Systems Review of Systems  Constitutional: Negative.   HENT: Negative.    Respiratory:  Positive for cough.   Cardiovascular: Negative.   Gastrointestinal: Negative.   Genitourinary:  Positive for dysuria.  Musculoskeletal: Negative.   Psychiatric/Behavioral: Negative.       Physical Exam Triage Vital Signs ED Triage Vitals  Encounter Vitals Group     BP 06/03/24 1537 121/75     Girls Systolic BP Percentile --      Girls Diastolic BP Percentile --      Boys Systolic BP Percentile --      Boys Diastolic BP Percentile --       Pulse Rate 06/03/24 1537 82     Resp 06/03/24 1537 18     Temp 06/03/24 1537 98.3 F (36.8 C)     Temp Source 06/03/24 1537 Oral     SpO2 06/03/24 1537 97 %     Weight --      Height --      Head Circumference --      Peak Flow --      Pain Score 06/03/24  1538 0     Pain Loc --      Pain Education --      Exclude from Growth Chart --    No data found.  Updated Vital Signs BP 121/75 (BP Location: Left Arm)   Pulse 82   Temp 98.3 F (36.8 C) (Oral)   Resp 18   LMP 02/12/2020   SpO2 97%   Visual Acuity Right Eye Distance:   Left Eye Distance:   Bilateral Distance:    Right Eye Near:   Left Eye Near:    Bilateral Near:     Physical Exam Constitutional:      General: She is not in acute distress.    Appearance: Normal appearance. She is normal weight. She is not ill-appearing or toxic-appearing.  HENT:     Nose: Nose normal.     Mouth/Throat:     Mouth: Mucous membranes are moist.  Cardiovascular:     Rate and Rhythm: Normal rate and regular rhythm.  Pulmonary:     Effort: Pulmonary effort is normal.     Breath sounds: Normal breath sounds. No wheezing or rhonchi.  Abdominal:     Palpations: Abdomen is soft.  Musculoskeletal:     Cervical back: Normal range of motion.  Neurological:     General: No focal deficit present.     Mental Status: She is alert and oriented to person, place, and time.  Psychiatric:        Mood and Affect: Mood normal.      UC Treatments / Results  Labs (all labs ordered are listed, but only abnormal results are displayed) Labs Reviewed  POCT URINE DIPSTICK - Abnormal; Notable for the following components:      Result Value   Color, UA light yellow (*)    Clarity, UA cloudy (*)    Spec Grav, UA <=1.005 (*)    Blood, UA moderate (*)    Leukocytes, UA Small (1+) (*)    All other components within normal limits  URINE CULTURE    EKG   Radiology No results found.  Procedures Procedures (including critical care  time)  Medications Ordered in UC Medications - No data to display  Initial Impression / Assessment and Plan / UC Course  I have reviewed the triage vital signs and the nursing notes.  Pertinent labs & imaging results that were available during my care of the patient were reviewed by me and considered in my medical decision making (see chart for details).    Final Clinical Impressions(s) / UC Diagnoses   Final diagnoses:  Urinary frequency  Chronic cough  Urinary tract infection with hematuria, site unspecified     Discharge Instructions      You were seen today for various issues.  Your urine does show infection.  I am treating you with an oral antibiotic for this.  I will also send your urine to the lab for culture.  If a change in medication is needed we will notify you.  Your chest xray appears normal.  If the radiologist reads this differently we will notify you.  You should follow up with your primary care provider and pulmonologist for further care for this chronic issue.     ED Prescriptions     Medication Sig Dispense Auth. Provider   sulfamethoxazole-trimethoprim (BACTRIM DS) 800-160 MG tablet Take 1 tablet by mouth 2 (two) times daily for 5 days. 10 tablet Darral Longs, MD      PDMP not reviewed  this encounter.   Darral Longs, MD 06/03/24 [phone]

## 2024-06-04 ENCOUNTER — Ambulatory Visit

## 2024-06-06 ENCOUNTER — Ambulatory Visit: Payer: Self-pay

## 2024-06-06 LAB — URINE CULTURE: Culture: 20000 — AB

## 2024-06-10 ENCOUNTER — Other Ambulatory Visit: Payer: Self-pay | Admitting: Internal Medicine

## 2024-07-29 ENCOUNTER — Ambulatory Visit
Admission: EM | Admit: 2024-07-29 | Discharge: 2024-07-29 | Disposition: A | Discharge status: Home / Self Care | Attending: Emergency Medicine | Admitting: Emergency Medicine

## 2024-07-29 DIAGNOSIS — J22 Unspecified acute lower respiratory infection: Secondary | ICD-10-CM

## 2024-07-29 DIAGNOSIS — J209 Acute bronchitis, unspecified: Secondary | ICD-10-CM | POA: Diagnosis not present

## 2024-07-29 MED ORDER — DOXYCYCLINE HYCLATE 100 MG PO CAPS: 100.0000 mg | ORAL_CAPSULE | Freq: Two times a day (BID) | ORAL | 0 refills | Status: AC

## 2024-07-29 NOTE — ED Triage Notes (Addendum)
 Pt c/o cough, heaviness feeling to chest, raw feeling throat r/t continuous cough.   Started: 12/24  At home covid test negative 12/27.

## 2024-07-29 NOTE — ED Provider Notes (Signed)
 [facility]    CSN: [id number] Arrival date & time: 07/29/24  1220      History   Chief Complaint Chief Complaint  Patient presents with   Chest Pain   Cough    HPI LATIESHA HARADA is a 58 y.o. female.   58 year old female, Zarinah Oviatt, presents to urgent care for evaluation of cough, pain with cough .patient has been taking Dayquil,Mucinex. Pt states this has been going on since 12/24, had negative Covid test.  Pt denies smoking,alcohol or drug use.  Works at [employer], no known illness exposure  The history is provided by the patient. No language interpreter was used.  Chest Pain Associated symptoms: cough   Associated symptoms: no fever   Cough Associated symptoms: chest pain   Associated symptoms: no fever     Past Medical History:  Diagnosis Date   Acute right-sided low back pain with right-sided sciatica 12/26/2018   Angio-edema    Gallstones    removed 2000   Headache(784.0)    Heart murmur    Hyperlipidemia    Pneumonia    Recurrent upper respiratory infection (URI)    Urticaria    Varicose veins     Patient Active Problem List   Diagnosis Date Noted   Acute bronchitis 07/29/2024   Acute respiratory infection 07/29/2024   Polyarthralgia 05/02/2024   Pain in right hand 01/10/2023   Subacromial bursitis of right shoulder joint 02/08/2022   Sacroiliac joint dysfunction of left side 05/09/2021   Lipoma of forearm 04/07/2021   Lumbar radiculopathy 04/07/2021   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  11/17/2013   Neck pain on right side 11/17/2013   Family hx of colon cancer 07/12/2012   ARTHRITIS, LEFT SHOULDER  04/13/2010   BREAST MASS, LEFT 02/04/2010   HEADACHE 05/02/2007    Past Surgical History:  Procedure Laterality Date   CESAREAN SECTION     CHOLECYSTECTOMY N/A 2007   COLONOSCOPY  01/27/2015   Dr.Gessner   KNEE SURGERY     left torn ligament 2007   SHOULDER SURGERY     right and left   WISDOM TOOTH EXTRACTION Bilateral 1997    OB History   No obstetric history on file.      Home Medications    Prior to Admission medications  Medication Sig Start Date End Date Taking? Authorizing Provider  doxycycline  (VIBRAMYCIN ) 100 MG capsule Take 1 capsule (100 mg total) by mouth 2 (two) times daily for 7 days. 07/29/24 08/05/24 Yes Samuel Rittenhouse, Rilla, NP  albuterol  (VENTOLIN  HFA) 108 (90 Base) MCG/ACT inhaler TAKE 2 PUFFS EVERY 6 HOURS AS NEEDED FOR WHEEZE OR FOR SHORTNESS OF BREATH OR COUGH 06/13/24   Panosh, Apolinar POUR, MD  azelastine  (ASTELIN ) 0.1 % nasal spray Place 1 spray into both nostrils 2 (two) times daily. Use in each nostril as directed 05/14/24   Meade Verdon RAMAN, MD  benzonatate  (TESSALON ) 200 MG capsule Take 1 capsule (200 mg total) by mouth 3 (three) times daily as needed for cough. 05/10/24   Juleen Rush, PA-C  Cholecalciferol (VITAMIN D3 PO) Take by mouth.    [provider]  Cyanocobalamin (VITAMIN B-12  PO) Take by mouth.    [provider]  diphenhydramine-acetaminophen (TYLENOL PM) 25-500 MG TABS Take 1 tablet by mouth at bedtime as needed (sleep).     [provider]  Fluticasone Furoate  (ARNUITY ELLIPTA ) 200 MCG/ACT AEPB Inhale 1 Inhalation into the lungs daily. 04/16/24   Panosh, Wanda K, MD  Menaquinone-7 (VITAMIN K2 PO) Take by mouth.    [provider]  Misc Natural Products (BEET ROOT PO) Take by mouth daily.    [provider]  Multiple Vitamins-Minerals (WOMENS MULTIVITAMIN PO) Take by mouth.    [provider]  naproxen  (EC NAPROSYN ) 500 MG EC tablet 1 tablet Orally every 12 hrs as needed; Duration: 30  day(s) Patient not taking: Reported on 05/14/2024 12/30/14   [provider]  nitrofurantoin , macrocrystal-monohydrate, (MACROBID ) 100 MG capsule Take 1 capsule (100 mg total) by mouth 2 (two) times daily. Patient not taking: Reported on 05/14/2024 05/02/24   Teresa Almarie LABOR, PA-C  NON FORMULARY [facility] supplement Patient not taking: Reported on 05/14/2024    [provider]  Potassium 99 MG TABS Take 99 mg by mouth daily.    [provider]  predniSONE  (DELTASONE ) 50 MG tablet Take 1 tablet (50 mg total) by mouth daily. Patient not taking: Reported on 05/14/2024 03/13/24   Panosh, Wanda K, MD  traMADol  (ULTRAM ) 50 MG tablet Take 1 tablet (50 mg total) by mouth every 8 (eight) hours as needed for severe pain (pain score 7-10). Patient not taking: Reported on 05/14/2024 04/17/24   Corey, Evan S, MD  Turmeric (QC TUMERIC COMPLEX PO) Take by mouth.    [provider]    Family History Family History  Problem Relation Age of Onset   Arthritis Mother    Diabetes Mother    Hyperlipidemia Mother    Heart disease Mother    Colon cancer Mother 37   Hypertension Father    Stomach cancer Maternal Grandmother 81   Prostate cancer Maternal Grandfather 72   Stroke Paternal Grandfather    Asthma Son    Esophageal cancer Neg Hx    Rectal cancer Neg Hx    Colon polyps Neg Hx    Lung disease Neg Hx     Social History Social History[1]   Allergies   Amoxicillin, Codeine, Tramadol , Vitamin e, Coconut (cocos nucifera), Magnesium-containing compounds, Penicillins, Vitamin e-safflower oil, Other, and Penicillin g   Review of Systems Review of Systems  Constitutional:  Negative for fever.  Respiratory:  Positive for cough.   Cardiovascular:  Positive for chest pain.  All other systems reviewed and are negative.    Physical Exam Triage Vital Signs ED Triage Vitals  Encounter Vitals Group     BP 07/29/24 1322 135/79     Girls Systolic BP  Percentile --      Girls Diastolic BP Percentile --      Boys Systolic BP Percentile --      Boys Diastolic BP Percentile --      Pulse Rate 07/29/24 1322 71     Resp 07/29/24 1322 15     Temp 07/29/24 1322 98.8 F (37.1 C)     Temp Source 07/29/24 1322 Oral     SpO2 07/29/24 1322 97 %     Weight --      Height --      Head Circumference --      Peak Flow --      Pain Score 07/29/24 1329 10  Pain Loc --      Pain Education --      Exclude from Growth Chart --    No data found.  Updated Vital Signs BP 135/79 (BP Location: Right Arm)   Pulse 71   Temp 98.8 F (37.1 C) (Oral)   Resp 15   LMP 02/12/2020   SpO2 97%   Visual Acuity Right Eye Distance:   Left Eye Distance:   Bilateral Distance:    Right Eye Near:   Left Eye Near:    Bilateral Near:     Physical Exam Vitals and nursing note reviewed.  Constitutional:      Appearance: Normal appearance. She is well-developed and well-groomed.  HENT:     Head: Normocephalic.     Right Ear: Tympanic membrane normal.     Left Ear: Tympanic membrane normal.     Nose: Congestion present.     Right Sinus: Maxillary sinus tenderness present.     Left Sinus: Maxillary sinus tenderness present.     Mouth/Throat:     Lips: Pink.     Mouth: Mucous membranes are moist.     Pharynx: Postnasal drip present.  Cardiovascular:     Rate and Rhythm: Normal rate and regular rhythm.     Heart sounds: Normal heart sounds.  Pulmonary:     Effort: Pulmonary effort is normal.     Breath sounds: Normal breath sounds and air entry.  Neurological:     General: No focal deficit present.     Mental Status: She is alert and oriented to person, place, and time.     GCS: GCS eye subscore is 4. GCS verbal subscore is 5. GCS motor subscore is 6.  Psychiatric:        Behavior: Behavior is cooperative.      UC Treatments / Results  Labs (all labs ordered are listed, but only abnormal results are displayed) Labs Reviewed - No data to  display  EKG   Radiology No results found.  Procedures Procedures (including critical care time)  Medications Ordered in UC Medications - No data to display  Initial Impression / Assessment and Plan / UC Course  I have reviewed the triage vital signs and the nursing notes.  Pertinent labs & imaging results that were available during my care of the patient were reviewed by me and considered in my medical decision making (see chart for details).    Discussed exam findings and plan of care with patient, scripted doxycycline  for acute respiratory infection, refer patient back to pulmonologist PCP for further evaluation, strict go to ER precautions given, patient verbalized understanding this provider.  Ddx: Acute respiratory infection, bronchitis, cough Final Clinical Impressions(s) / UC Diagnoses   Final diagnoses:  Acute bronchitis, unspecified organism  Acute respiratory infection     Discharge Instructions      Rest, push fluids, take doxycycline  as prescribed.  Follow-up with your pulmonologist-call for appointment.  If you have new or worsening symptoms go to emergency room for further evaluation(worsening shortness of breath,  palpitations, etc)     ED Prescriptions     Medication Sig Dispense Auth. Provider   doxycycline  (VIBRAMYCIN ) 100 MG capsule Take 1 capsule (100 mg total) by mouth 2 (two) times daily for 7 days. 14 capsule Maitland Lesiak, NP      PDMP not reviewed this encounter.     [1]  Social History Tobacco Use   Smoking status: Never   Smokeless tobacco: Never  Vaping Use  Vaping status: Never Used  Substance Use Topics   Alcohol use: No   Drug use: No     Kaya Klausing, Rilla, NP 07/29/24 2248  "

## 2024-07-29 NOTE — Discharge Instructions (Signed)
 Rest, push fluids, take doxycycline  as prescribed.  Follow-up with your pulmonologist-call for appointment.  If you have new or worsening symptoms go to emergency room for further evaluation(worsening shortness of breath,  palpitations, etc)

## 2024-08-15 ENCOUNTER — Other Ambulatory Visit: Payer: Self-pay | Admitting: Internal Medicine

## 2024-08-18 LAB — HM MAMMOGRAPHY

## 2024-08-20 ENCOUNTER — Encounter: Payer: Self-pay | Admitting: Internal Medicine
